# Patient Record
Sex: Male | Born: 1940 | Race: White | Hispanic: No | Marital: Married | Smoking: Former smoker
Health system: Southern US, Community
[De-identification: ages and names within clinical notes are randomized; demographics above are authoritative.]

## PROBLEM LIST (undated history)

## (undated) DIAGNOSIS — F32A Depression, unspecified: Secondary | ICD-10-CM

## (undated) DIAGNOSIS — M1990 Unspecified osteoarthritis, unspecified site: Secondary | ICD-10-CM

## (undated) DIAGNOSIS — K922 Gastrointestinal hemorrhage, unspecified: Secondary | ICD-10-CM

## (undated) DIAGNOSIS — N186 End stage renal disease: Secondary | ICD-10-CM

## (undated) DIAGNOSIS — IMO0001 Reserved for inherently not codable concepts without codable children: Secondary | ICD-10-CM

## (undated) DIAGNOSIS — G8929 Other chronic pain: Secondary | ICD-10-CM

## (undated) DIAGNOSIS — M549 Dorsalgia, unspecified: Secondary | ICD-10-CM

## (undated) DIAGNOSIS — E119 Type 2 diabetes mellitus without complications: Secondary | ICD-10-CM

## (undated) DIAGNOSIS — I509 Heart failure, unspecified: Secondary | ICD-10-CM

## (undated) DIAGNOSIS — Z9289 Personal history of other medical treatment: Secondary | ICD-10-CM

## (undated) DIAGNOSIS — J449 Chronic obstructive pulmonary disease, unspecified: Secondary | ICD-10-CM

## (undated) DIAGNOSIS — I10 Essential (primary) hypertension: Secondary | ICD-10-CM

## (undated) DIAGNOSIS — F419 Anxiety disorder, unspecified: Secondary | ICD-10-CM

## (undated) DIAGNOSIS — M109 Gout, unspecified: Secondary | ICD-10-CM

## (undated) DIAGNOSIS — J189 Pneumonia, unspecified organism: Secondary | ICD-10-CM

## (undated) DIAGNOSIS — K260 Acute duodenal ulcer with hemorrhage: Secondary | ICD-10-CM

## (undated) DIAGNOSIS — F329 Major depressive disorder, single episode, unspecified: Secondary | ICD-10-CM

## (undated) DIAGNOSIS — E669 Obesity, unspecified: Secondary | ICD-10-CM

## (undated) DIAGNOSIS — D649 Anemia, unspecified: Secondary | ICD-10-CM

## (undated) DIAGNOSIS — I4892 Unspecified atrial flutter: Secondary | ICD-10-CM

## (undated) DIAGNOSIS — Z992 Dependence on renal dialysis: Secondary | ICD-10-CM

## (undated) HISTORY — PX: JOINT REPLACEMENT: SHX530

## (undated) HISTORY — PX: TOTAL HIP ARTHROPLASTY: SHX124

## (undated) HISTORY — DX: Gastrointestinal hemorrhage, unspecified: K92.2

## (undated) HISTORY — DX: Acute duodenal ulcer with hemorrhage: K26.0

## (undated) HISTORY — PX: CHOLECYSTECTOMY: SHX55

---

## 2000-03-23 ENCOUNTER — Emergency Department (HOSPITAL_COMMUNITY): Admission: EM | Admit: 2000-03-23 | Discharge: 2000-03-23 | Payer: Self-pay | Admitting: Emergency Medicine

## 2000-03-23 ENCOUNTER — Encounter: Payer: Self-pay | Admitting: Emergency Medicine

## 2006-02-23 ENCOUNTER — Encounter: Admission: RE | Admit: 2006-02-23 | Discharge: 2006-02-23 | Payer: Self-pay | Admitting: Internal Medicine

## 2006-06-15 ENCOUNTER — Inpatient Hospital Stay (HOSPITAL_COMMUNITY): Admission: RE | Admit: 2006-06-15 | Discharge: 2006-06-19 | Payer: Self-pay | Admitting: Orthopedic Surgery

## 2006-07-17 ENCOUNTER — Encounter: Admission: RE | Admit: 2006-07-17 | Discharge: 2006-08-15 | Payer: Self-pay | Admitting: Orthopedic Surgery

## 2006-12-19 HISTORY — PX: ARTERIOVENOUS GRAFT PLACEMENT: SUR1029

## 2007-08-01 ENCOUNTER — Encounter: Admission: RE | Admit: 2007-08-01 | Discharge: 2007-08-01 | Payer: Self-pay | Admitting: Internal Medicine

## 2007-10-09 ENCOUNTER — Ambulatory Visit: Payer: Self-pay | Admitting: Cardiology

## 2007-10-09 ENCOUNTER — Inpatient Hospital Stay (HOSPITAL_COMMUNITY): Admission: EM | Admit: 2007-10-09 | Discharge: 2007-10-26 | Payer: Self-pay | Admitting: Emergency Medicine

## 2007-10-09 ENCOUNTER — Ambulatory Visit: Payer: Self-pay | Admitting: Hematology and Oncology

## 2007-10-15 ENCOUNTER — Ambulatory Visit: Payer: Self-pay | Admitting: Vascular Surgery

## 2007-10-15 ENCOUNTER — Encounter (INDEPENDENT_AMBULATORY_CARE_PROVIDER_SITE_OTHER): Payer: Self-pay | Admitting: Nephrology

## 2007-10-18 ENCOUNTER — Ambulatory Visit: Payer: Self-pay | Admitting: Vascular Surgery

## 2007-12-06 ENCOUNTER — Ambulatory Visit (HOSPITAL_COMMUNITY): Admission: RE | Admit: 2007-12-06 | Discharge: 2007-12-06 | Payer: Self-pay | Admitting: Nephrology

## 2007-12-10 ENCOUNTER — Ambulatory Visit: Payer: Self-pay | Admitting: Vascular Surgery

## 2007-12-10 ENCOUNTER — Ambulatory Visit (HOSPITAL_COMMUNITY): Admission: RE | Admit: 2007-12-10 | Discharge: 2007-12-10 | Payer: Self-pay | Admitting: Nephrology

## 2008-01-12 ENCOUNTER — Ambulatory Visit (HOSPITAL_COMMUNITY): Admission: RE | Admit: 2008-01-12 | Discharge: 2008-01-12 | Payer: Self-pay | Admitting: Vascular Surgery

## 2008-01-12 ENCOUNTER — Ambulatory Visit: Payer: Self-pay | Admitting: Vascular Surgery

## 2008-08-06 ENCOUNTER — Encounter: Admission: RE | Admit: 2008-08-06 | Discharge: 2008-08-06 | Payer: Self-pay | Admitting: Nephrology

## 2008-08-07 ENCOUNTER — Encounter: Admission: RE | Admit: 2008-08-07 | Discharge: 2008-08-07 | Payer: Self-pay | Admitting: Nephrology

## 2008-08-09 ENCOUNTER — Inpatient Hospital Stay (HOSPITAL_COMMUNITY): Admission: EM | Admit: 2008-08-09 | Discharge: 2008-08-12 | Payer: Self-pay | Admitting: Emergency Medicine

## 2008-08-11 ENCOUNTER — Encounter (INDEPENDENT_AMBULATORY_CARE_PROVIDER_SITE_OTHER): Payer: Self-pay | Admitting: General Surgery

## 2008-10-19 IMAGING — US US ABDOMEN COMPLETE
1 series · 14 of 25 positions shown · non-contrast
Comparison: None

CLINICAL DATA: Right upper quadrant pain

ABDOMEN ULTRASOUND
TECHNIQUE: Complete abdominal ultrasound examination was performed
including evaluation of the liver, gallbladder, bile ducts,
pancreas, kidneys, spleen, IVC, and abdominal aorta.

[Series 1: us abdomen complete · 0.30mm/px · 14 of 77 slices shown]
[im 1/77]
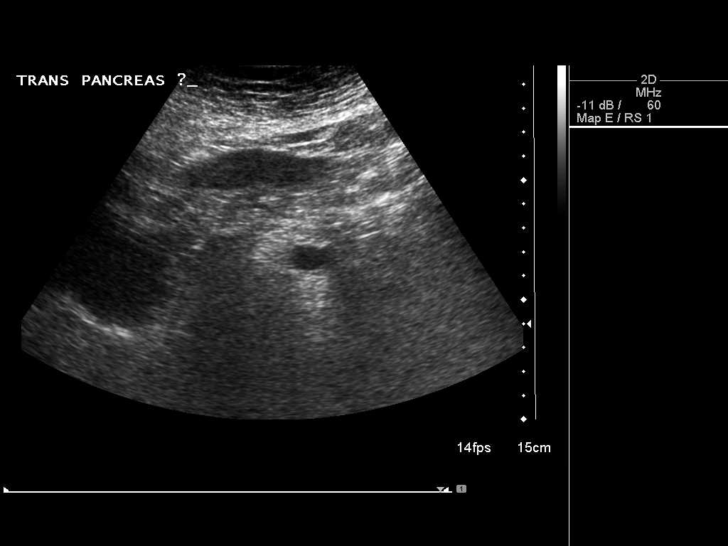
[im 7/77]
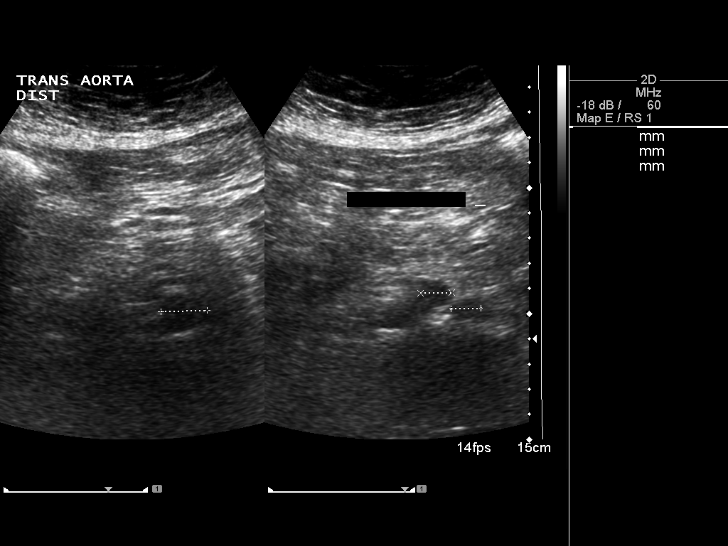
[im 13/77]
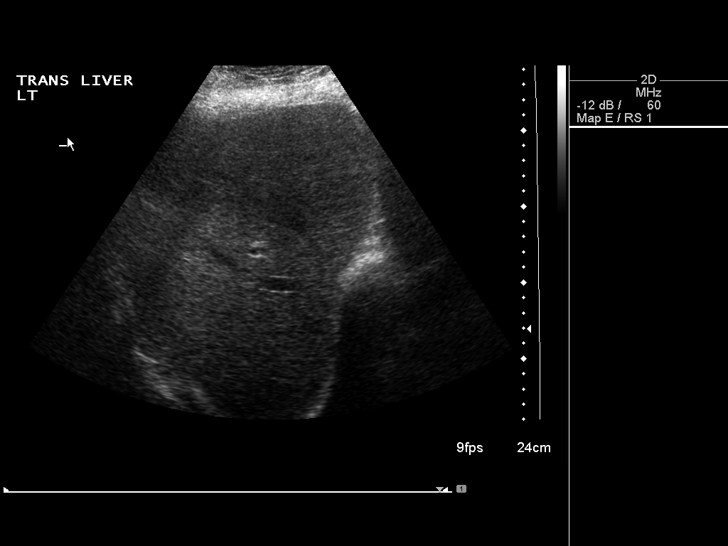
[im 20/77]
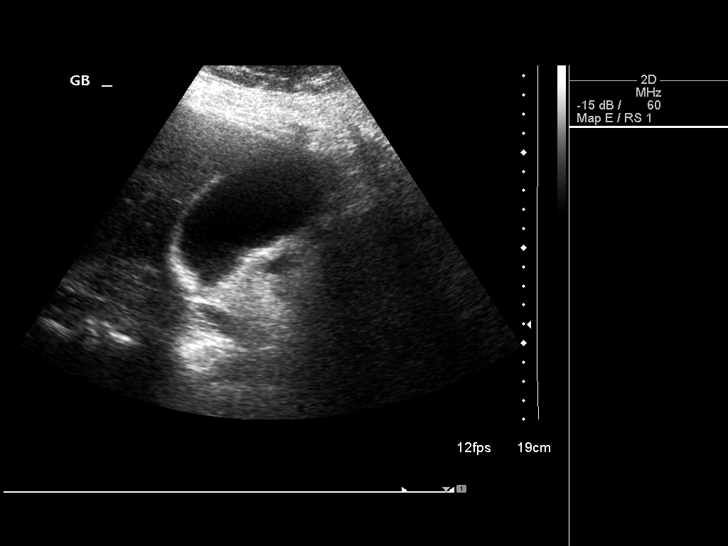
[im 26/77]
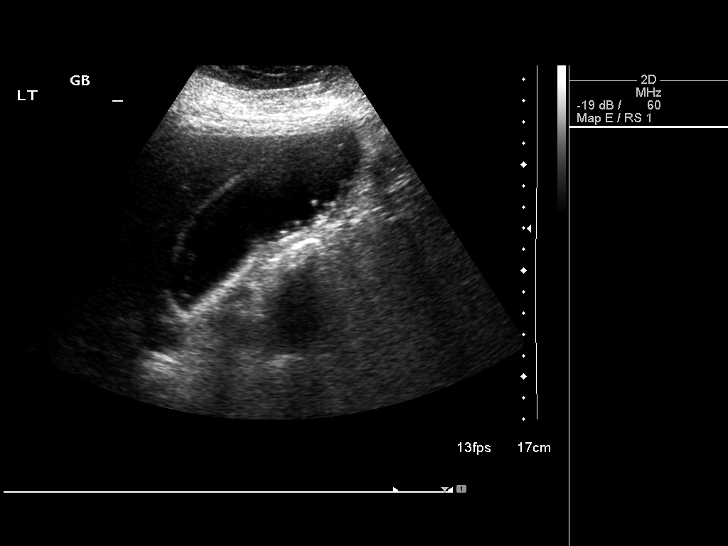
[im 29/77]
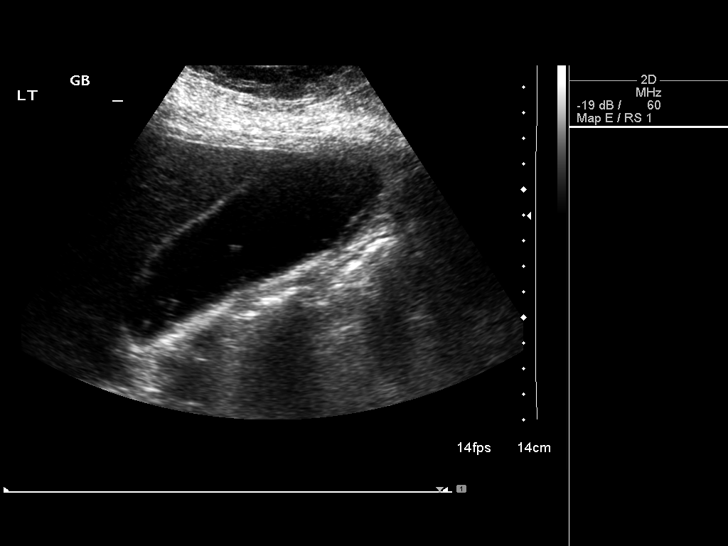
[im 35/77]
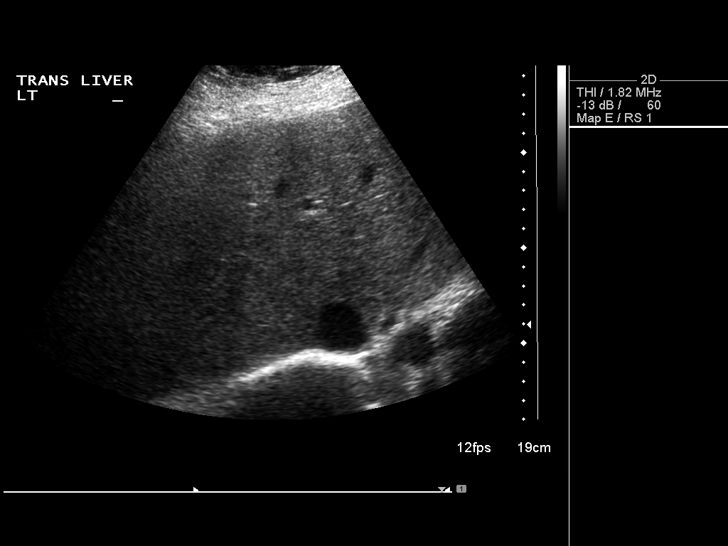
[im 42/77]
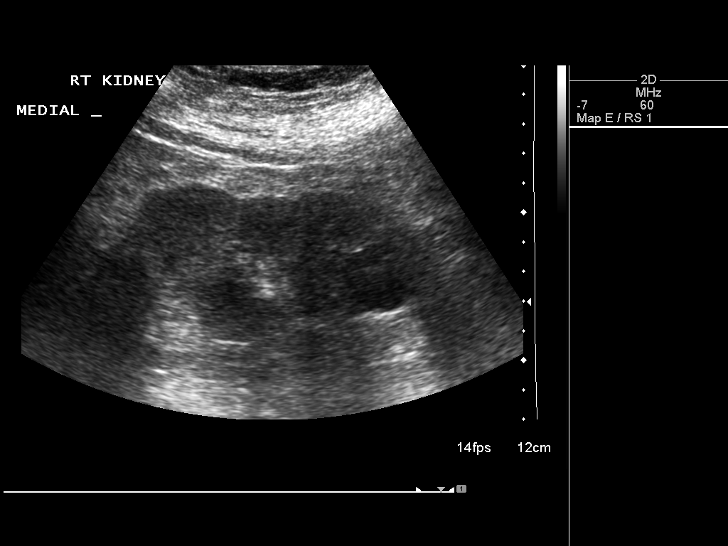
[im 48/77]
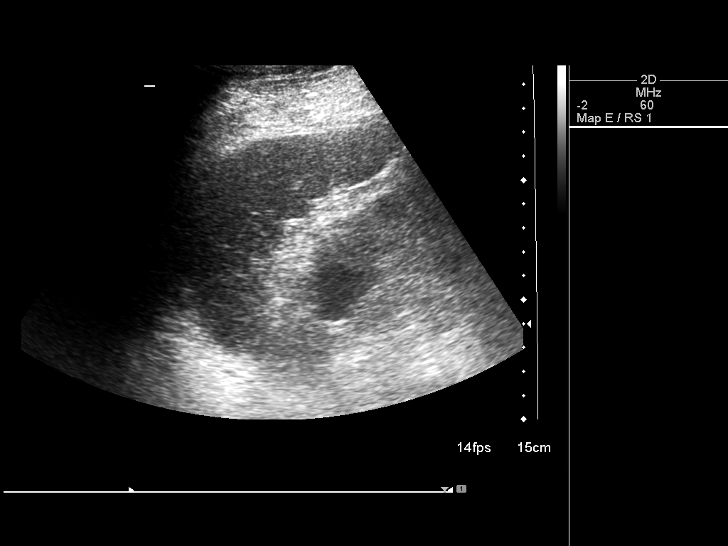
[im 51/77]
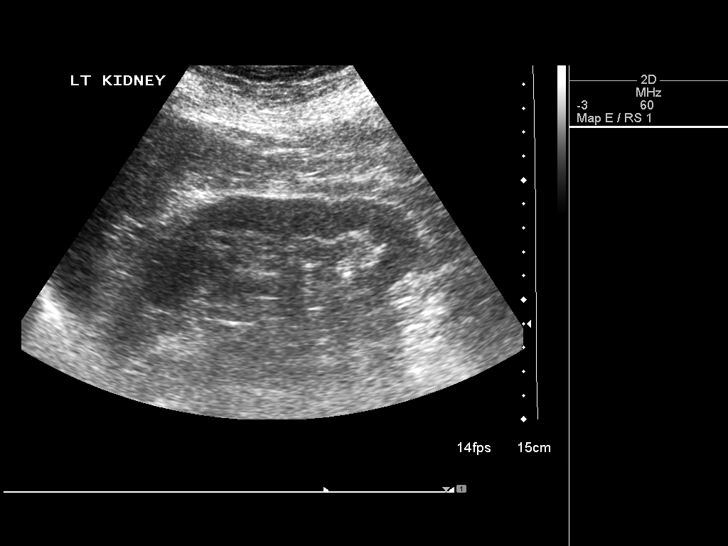
[im 58/77]
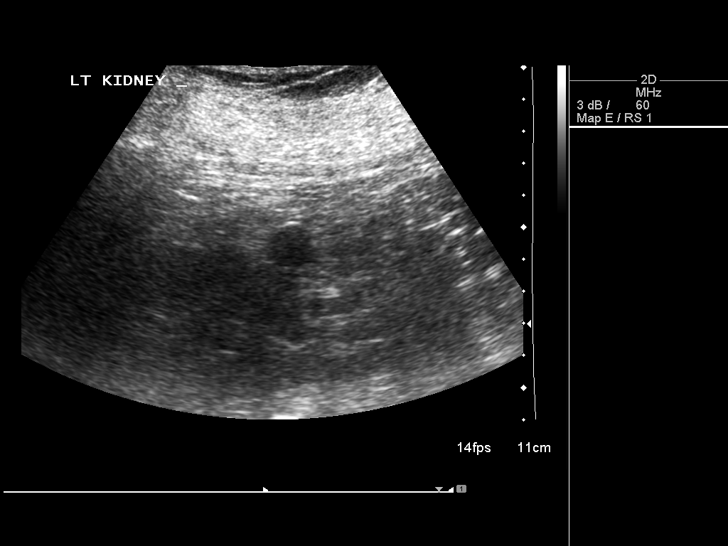
[im 64/77]
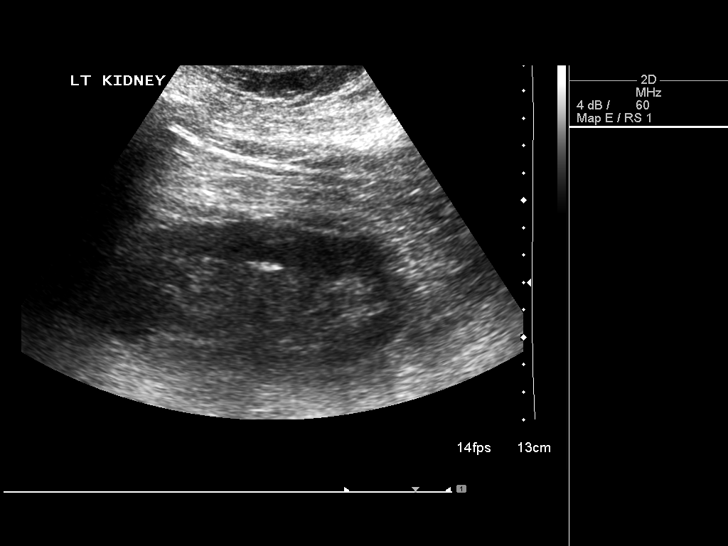
[im 70/77]
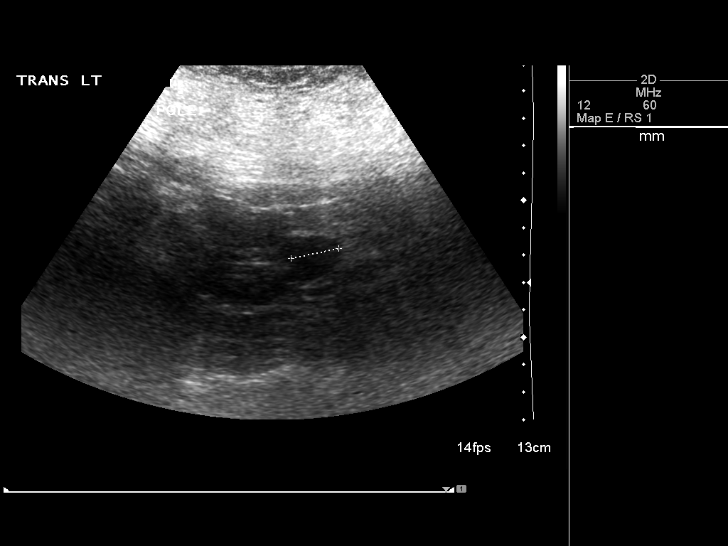
[im 77/77]
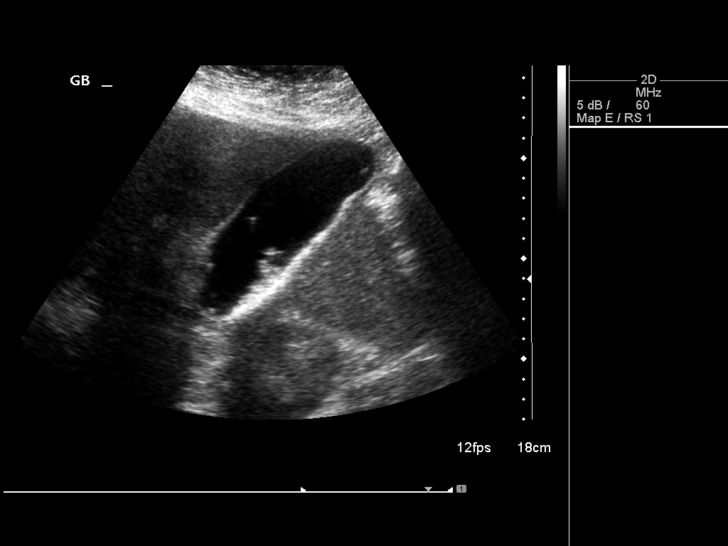

[14 of 25 positions shown; findings below may reference images not displayed]

FINDINGS: Numerous tiny mobile gallstones are seen within the
gallbladder lumen.  No gallbladder wall thickening or
pericholecystic fluid are identified.  The common duct is upper
limits of normal measuring 7 mm in width.  The liver and spleen
have a normal appearance.  The pancreas, aorta,  and IVC are not
well seen because of overlying bowel gas.  And 8 mm calculus is
noted at the upper pole of the right kidney, and there is a 2.2 cm
complex cyst at the lower pole of the right kidney.  There are
three separate complex cysts versus solid masses on the left
kidney.  The largest of these is at the upper pole measuring
cm.
IMPRESSION: Cholelithiasis.

There are complex cysts on both kidneys.  Correlation with CT scan
may be of help for better characterization.

## 2008-10-23 IMAGING — CR DG CHEST 2V
2 series · 2 of 2 positions shown · non-contrast
Comparison: CT angio chest 08/09/2008.  Two-view chest x-ray
10/20/2007.

CLINICAL DATA: Leukocytosis. Preoperative respiratory evaluation
prior to cholecystectomy.

CHEST - 2 VIEW 08/11/2008:

[w chest pa]
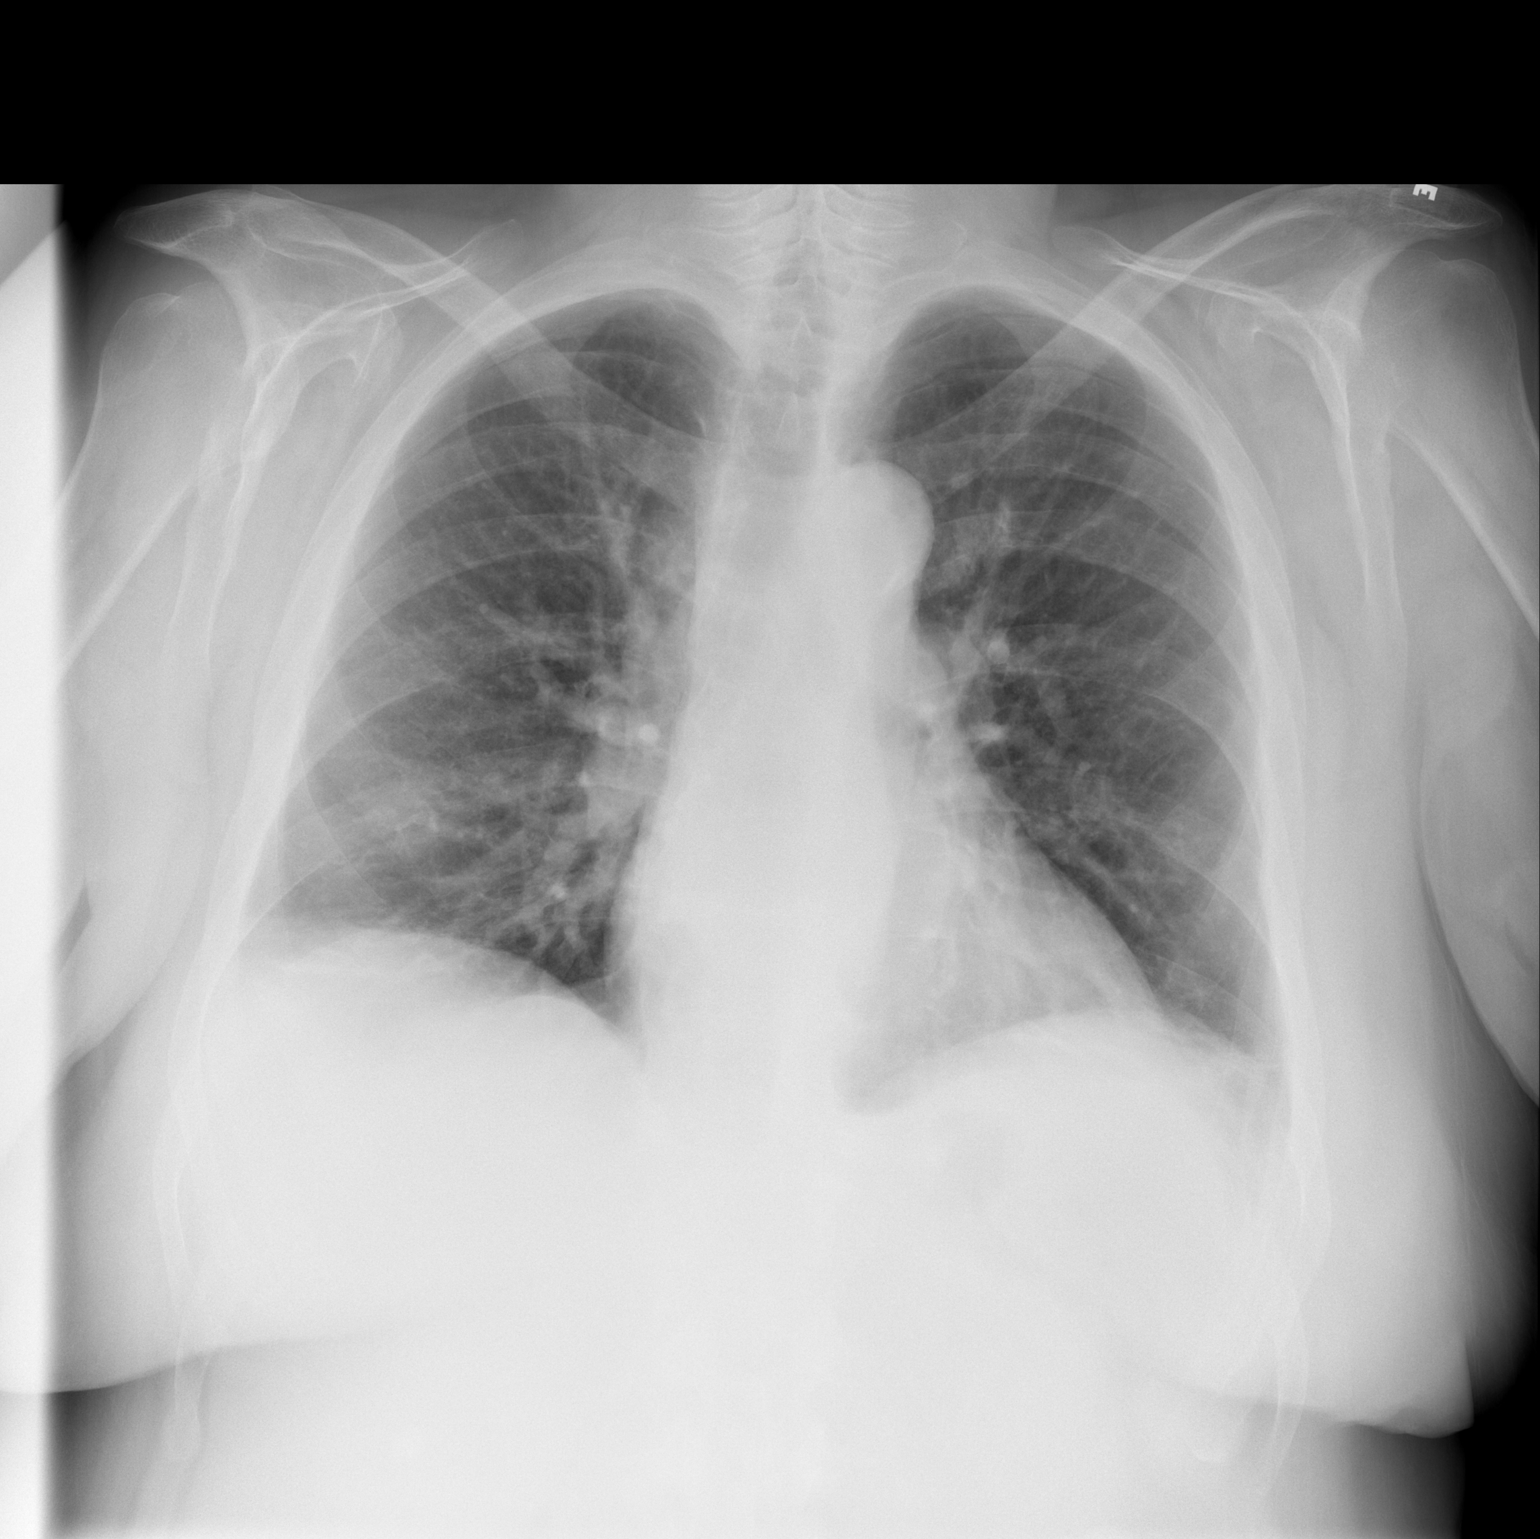

[w chest lat]
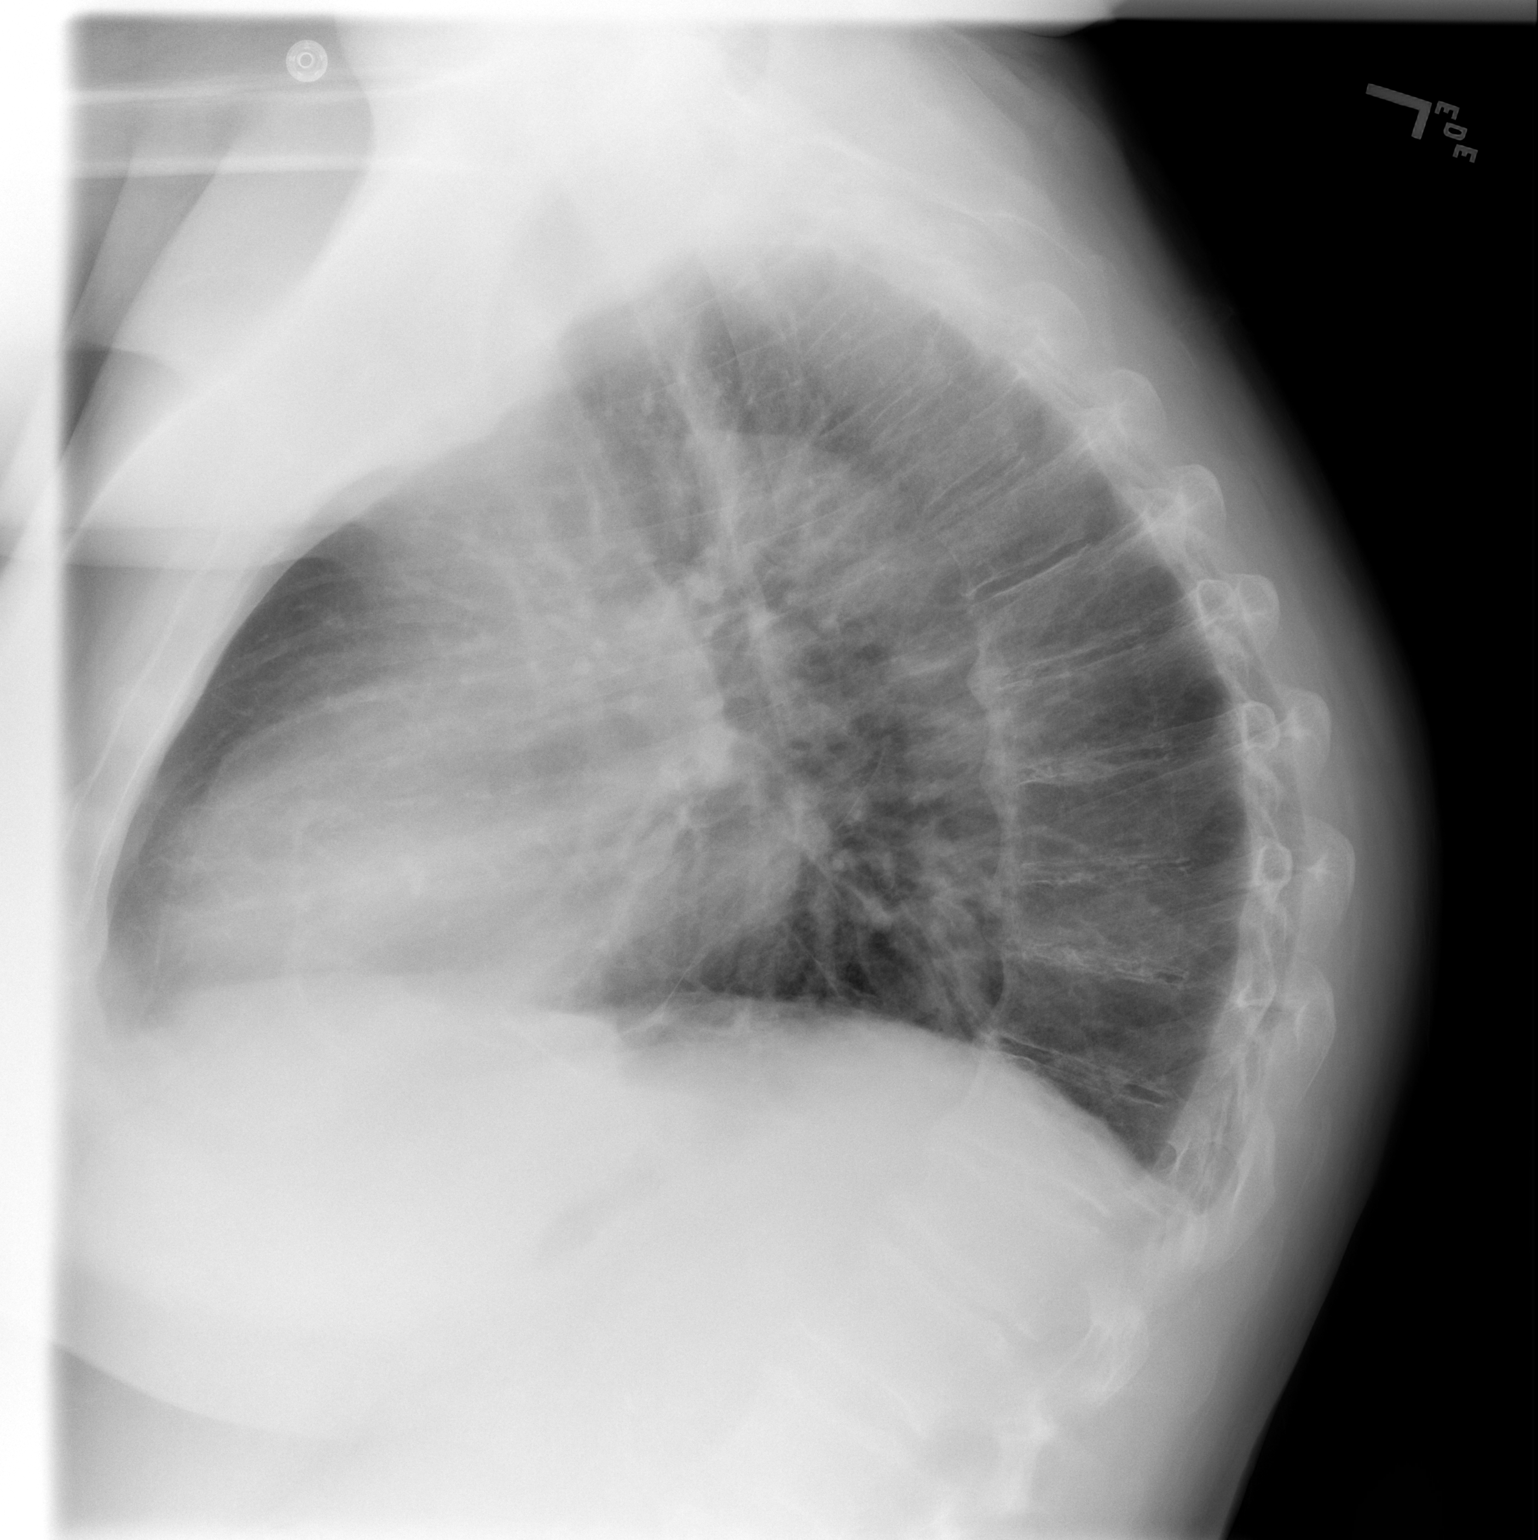

[2 of 2 positions shown; findings below may reference images not displayed]

FINDINGS: Stable mild linear atelectasis in the lower lobes as
noted on the CT.  Lungs otherwise clear.  Heart size normal and
stable.  Hilar and mediastinal contours unremarkable.  No pleural
effusions.  Degenerative changes throughout the thoracic spine with
exaggeration of the usual thoracic kyphosis.
IMPRESSION: Stable mild atelectasis in the lower lobes as noted on the CT 2
days ago.  No new abnormalities.

## 2009-04-07 ENCOUNTER — Ambulatory Visit (HOSPITAL_COMMUNITY): Admission: RE | Admit: 2009-04-07 | Discharge: 2009-04-07 | Payer: Self-pay | Admitting: Nephrology

## 2009-09-17 ENCOUNTER — Ambulatory Visit (HOSPITAL_COMMUNITY): Admission: RE | Admit: 2009-09-17 | Discharge: 2009-09-17 | Payer: Self-pay | Admitting: Nephrology

## 2010-03-11 ENCOUNTER — Ambulatory Visit (HOSPITAL_COMMUNITY): Admission: RE | Admit: 2010-03-11 | Discharge: 2010-03-11 | Payer: Self-pay | Admitting: Nephrology

## 2010-10-12 ENCOUNTER — Ambulatory Visit (HOSPITAL_COMMUNITY)
Admission: RE | Admit: 2010-10-12 | Discharge: 2010-10-12 | Payer: Self-pay | Source: Home / Self Care | Admitting: Nephrology

## 2010-12-22 ENCOUNTER — Ambulatory Visit (HOSPITAL_COMMUNITY)
Admission: RE | Admit: 2010-12-22 | Discharge: 2010-12-22 | Payer: Self-pay | Source: Home / Self Care | Attending: Nephrology | Admitting: Nephrology

## 2010-12-22 LAB — POCT I-STAT, CHEM 8
BUN: 40 mg/dL — ABNORMAL HIGH (ref 6–23)
Calcium, Ion: 1.1 mmol/L — ABNORMAL LOW (ref 1.12–1.32)
Chloride: 105 mEq/L (ref 96–112)
Creatinine, Ser: 8.1 mg/dL — ABNORMAL HIGH (ref 0.4–1.5)
Glucose, Bld: 140 mg/dL — ABNORMAL HIGH (ref 70–99)
HCT: 37 % — ABNORMAL LOW (ref 39.0–52.0)
Hemoglobin: 12.6 g/dL — ABNORMAL LOW (ref 13.0–17.0)
Potassium: 4.9 mEq/L (ref 3.5–5.1)
Sodium: 137 mEq/L (ref 135–145)
TCO2: 26 mmol/L (ref 0–100)

## 2010-12-29 ENCOUNTER — Ambulatory Visit (HOSPITAL_COMMUNITY)
Admission: RE | Admit: 2010-12-29 | Discharge: 2010-12-29 | Payer: Self-pay | Source: Home / Self Care | Attending: Vascular Surgery | Admitting: Vascular Surgery

## 2011-01-03 LAB — GLUCOSE, CAPILLARY
Glucose-Capillary: 111 mg/dL — ABNORMAL HIGH (ref 70–99)
Glucose-Capillary: 120 mg/dL — ABNORMAL HIGH (ref 70–99)

## 2011-01-03 LAB — POCT I-STAT 4, (NA,K, GLUC, HGB,HCT)
Glucose, Bld: 106 mg/dL — ABNORMAL HIGH (ref 70–99)
HCT: 36 % — ABNORMAL LOW (ref 39.0–52.0)
Hemoglobin: 12.2 g/dL — ABNORMAL LOW (ref 13.0–17.0)
Potassium: 4.5 mEq/L (ref 3.5–5.1)
Sodium: 141 mEq/L (ref 135–145)

## 2011-01-03 LAB — PROTIME-INR
INR: 0.88 (ref 0.00–1.49)
Prothrombin Time: 12.1 seconds (ref 11.6–15.2)

## 2011-01-03 LAB — SURGICAL PCR SCREEN
MRSA, PCR: NEGATIVE
Staphylococcus aureus: NEGATIVE

## 2011-01-03 LAB — APTT: aPTT: 35 seconds (ref 24–37)

## 2011-01-09 ENCOUNTER — Encounter: Payer: Self-pay | Admitting: Internal Medicine

## 2011-01-10 ENCOUNTER — Encounter: Payer: Self-pay | Admitting: Nephrology

## 2011-05-03 NOTE — Consult Note (Signed)
NAMECHESTER, SIBERT               ACCOUNT NO.:  [id number]   MEDICAL RECORD NO.:  [id number]          PATIENT TYPE:  OBV   LOCATION:  5152                         FACILITY:  [facility]   PHYSICIAN:  Cherylynn Ridges, M.D.    DATE OF BIRTH:  10/31/1941   DATE OF CONSULTATION:  DATE OF DISCHARGE:                                 CONSULTATION   Thank you very much for asking me to see Mr. Diehl.  He is a 70-year-  old gentleman who recently diagnosed with gallstones with abdominal pain  in the right upper quadrant, right flank, and right back.  He has had a  series of tests including a CT scan and an ultrasound.  His CT scan  identified gallstones, and ultrasound confirmed the presence of stones  and no pericholecystic fluid or any gallbladder wall thickening.  The  common bile duct was dilated on the ultrasound, but no intraductal  stones were noted.   He comes in today with a white count of 21.3 thousand, but not looking  septic.  Last dialysis was on yesterday on Friday and he comes in for  evaluation.   PAST MEDICAL HISTORY:  1. Noninsulin-dependent diabetes.  2. Hypertension.  3. Chronic renal insufficiency.   PAST SURGICAL HISTORY:  He has had a total hip replacement in July 2007  and also has had a left arm fistula placed by Dr. Hart Rochester, revised by Dr.  Cira Rue from which he gets his dialysis.   Medications include allopurinol, diltiazem, glyburide, iron, and vitamin  C.   ALLERGIES:  He is allergic to METFORMIN, PENICILLIN, and SULFA  MEDICATIONS.   For review of systems, he has had no fevers and chills.  No jaundice.  Bowel movements have been dark and not light.  He makes no urine.   On exam, he is afebrile and 98.4, pulse of 96, blood pressure 112/60,  and respiratory rate is 24.  He is normocephalic and atraumatic and  anicteric.  Neck is supple.  Lungs are clear.  Cardiac exam regular  rhythm and rate.  His abdomen, he is morbidly obese with no previous  scars.  He does  have some striae on his abdominal wall longitudinally  with no scars.  He is mildly tender in the lateral aspect of his  abdominal wall in his right costal margin with no palpable masses and no  palpable gallbladder.  Liver and spleen could not be palpated.   I have reviewed his laboratory studies and his LFTs that I can see were  normal.  His white count was 21.3 thousand.  His hemoglobin was low,  however, at 9.6 with hematocrit of 28.   IMPRESSION:  The patient has gallstones on the previous ultrasound, but  he is also anemic, has diabetes, history of hypertension, and chronic  renal insufficiency.  Because of his multiple medical problems, I think  it is more appropriate for the patient to be admitted to a Medical  Service and for [name] to consult for possible cholecystectomy in the near  future after the patient has been on IV antibiotics for at  least 24-48  hours.  I have gone ahead and written a  note.  We will leave this to the other services to admit, and we will  try to get him done as soon as possible.  There is possibility that  there is another source for his leukocytosis; however, it seems most  likely the gallstones although the ultrasound does not demonstrated  thickened wall.      Cherylynn Ridges, M.D.  Electronically Signed     JOW/MEDQ  D:  08/09/2008  T:  08/10/2008  Job:  [id number]

## 2011-05-03 NOTE — Op Note (Signed)
NAMEJANN, RA               ACCOUNT NO.:  [id number]   MEDICAL RECORD NO.:  [id number]          PATIENT TYPE:  AMB   LOCATION:  SDS                          FACILITY:  [facility]   PHYSICIAN:  Larina Earthly, M.D.    DATE OF BIRTH:  1941/05/26   DATE OF PROCEDURE:  01/12/2008  DATE OF DISCHARGE:  01/12/2008                               OPERATIVE REPORT   PREOPERATIVE DIAGNOSIS:  End stage renal disease with occluded left  forearm loop arteriovenous Gore-Tex graft.   POSTOPERATIVE DIAGNOSIS:  End stage renal disease with occluded left  forearm loop arteriovenous Gore-Tex graft.   PROCEDURE:  Thrombectomy, interposition jump graft revision to higher  basilic vein of left forearm loop arteriovenous Gore-Tex graft.   SURGEON:  Larina Earthly, M.D.   ASSISTANT:  Nurse   ANESTHESIA:  Monitored anesthesia care.   COMPLICATIONS:  None.   DISPOSITION:  To the recovery room stable.   PROCEDURE IN DETAIL:  The patient was taken to the operating room and  placed in the supine position where the area of the left arm was prepped  and draped in the usual sterile fashion.  An incision was made over the  basilic vein in the above elbow position.  This was imaged with  ultrasound and was of good caliber.  Next, a separate incision was made  over the prior antecubital scar and carried down to isolate the graft  near the old venous revision.  The graft was opened transversely, was  thrombectomized, the arterialized plug was removed, and excellent in  flow was encountered.  This was flushed with heparinized saline and  reoccluded.  A 7 mm Gore-Tex graft was brought onto the field, was  spatulated, and was sewn end-to-side of the basilic vein in the above  elbow position on the medial aspect of the arm.  The graft was flushed  with heparinized saline and reoccluded.  This was then tunneled to the  level of the prior venous end limb of the graft.  The old venous limb of  the graft was ligated  proximally and was divided.  The new interposition  graft was sewn end-to-end to the old graft with a running 6-0 Prolene  suture.  The clamps were removed and an excellent thrill was noted.  The  wound was irrigated with saline.  Hemostasis was obtained with  electrocautery.  The wounds were closed with 3-0 Vicryl in the  subcutaneous and subcuticular tissue.  Benzoin and Steri-Strips were  applied.     Larina Earthly, M.D.  Electronically Signed    TFE/MEDQ  D:  01/12/2008  T:  01/12/2008  Job:  [id number]

## 2011-05-03 NOTE — Discharge Summary (Signed)
[name], [name]               ACCOUNT NO.:  [id number]   MEDICAL RECORD NO.:  [id number]          PATIENT TYPE:  INP   LOCATION:  4741                         FACILITY:  [facility]   PHYSICIAN:  Wilson Singer, M.D.DATE OF BIRTH:  [birth year]   DATE OF ADMISSION:  10/09/2007  DATE OF DISCHARGE:                               DISCHARGE SUMMARY   FINAL DISCHARGE DIAGNOSES:  1. Congestive heart failure, systolic and diastolic.  2. Atrial flutter, now in sinus rhythm.  3. End-stage renal disease.  4. Anemia of chronic disease.  5. Diabetes mellitus, stable.   CURRENT MEDICATIONS:  1. Ferrous sulfate 325 mg daily.  2. Aspirin 81 mg daily.  3. Protonix 40 mg daily.  4. Sliding scale insulin.  5. Aranesp 100 mcg on Friday's.  6. Coreg 12.5 mg b.i.d.  7. Bumex 5 mg b.i.d.  8. Amiodarone 400 mg b.i.d.  9. Ancef 1 gram q.24 hours IV.   HISTORY:  This is a very pleasant 70 year old man was admitted with a 2  week history of shortness of breath and dyspnea on exertion.  He was  found to be in pulmonary edema.  Please see initial history and physical  examination done by Dr. Marthann Schiller.   HOSPITAL PROGRESS:  The patient was admitted and promptly diuresed as  appropriate.  He had an echocardiogram done this year, which showed an  ejection fraction of 40%.  Also his creatinine was elevated on admission  and 6 months previously he was noted to be at about 2 and on admission  was around about 4.  Therefore, nephrology was involved with his care  and they eventually felt that he was end-stage renal disease based on  the diuresis that was occurring by the cardiologist help also.  Unfortunately he went into a tachycardia, which was atrial flutter and  Dr. [place], cardiologist, was involved in his care and instituted  amiodarone.  This converted him into sinus rhythm.  Currently he is  stable and has lost significant fluid and his creatinine has bumped up  to the 5 range.  He has had  vein mapping for dialysis and today is due  to have a dialysis catheter inserted by interventional radiology.  He  feels well overall.   PHYSICAL EXAMINATION:  VITAL SIGNS:  Temperature 95.6, which is low for  him, but he does not look septic.  Blood pressure 122/71, pulse 62 in  sinus rhythm, saturation 93% on room air.  HEART:  Sounds are present and normal in sinus rhythm.  LUNGS:  Clinically clear.   INVESTIGATIONS:  Show BNP of 474, sodium 133, potassium 3.8, bicarbonate  28, BUN 134, creatinine 5.52, INR 1.7, hemoglobin 9.4.  White blood cell  count 19.1, platelets 193.   FURTHER DISPOSITION:  I would think that once he has had his dialysis  catheter inserted and he undergoes dialysis, it should not be too long  before he will be able to be discharged home.  I would keep an eye on  the white blood cell count to make sure there is no evidence of  infection, active  infection, although clinically it does not look like  so.      Wilson Singer, M.D.  Electronically Signed     NCG/MEDQ  D:  10/16/2007  T:  10/16/2007  Job:  [id number]

## 2011-05-03 NOTE — Discharge Summary (Signed)
[name], ENGLERT               ACCOUNT NO.:  [id number]   MEDICAL RECORD NO.:  [id number]          PATIENT TYPE:  INP   LOCATION:  5152                         FACILITY:  [facility]   PHYSICIAN:  Altha Harm, MDDATE OF BIRTH:  [birth year]   DATE OF ADMISSION:  08/09/2008  DATE OF DISCHARGE:  08/12/2008                               DISCHARGE SUMMARY   DISCHARGE DISPOSITION:  Home.   FINAL DISCHARGE DIAGNOSES:  1. Acute cholecystitis status post laparoscopic cholecystectomy.  2. Leukocytosis, resolving.  3. Urinary tract infection.  4. End-stage renal disease.  5. History of gout in the past.  6. Anemia of chronic disease status post transfusion 1 unit packed red      blood cells.  7. Atrial flutter.  8. Congestive heart failure with systolic dysfunction.  9. History of kidney stones.  10.History of osteoarthritis.   DISCHARGE MEDICATIONS:  Include the following:  1. Coreg 3.125 mg p.o. daily.  2. Lantus 20 units subcu nightly.  3. Nephro-Vite 1 tablet p.o. daily.  4. Iron 50 mg p.o. daily.  5. NovoLog 2 unites daily for blood sugars greater than 150.  6. Ciprofloxacin 500 mg p.o. every 24 hours x4 days.  7. Percocet 04/8324 mg 1-2 tablets p.o. every 4 hours p.r.n. pain.   Medications on hold for now: Aspirin 81 mg p.o. daily until seen by Dr.  Dwain Sarna in the office.   CONSULTANTS:  1. Juanetta Gosling, MD from [facility].  2. Nephrology.   PROCEDURES:  1. Laparoscopic cholecystectomy done August 24.  2. Hemodialysis.   DIAGNOSTIC STUDIES:  1. Acute abdominal series done on admission on August 22, which shows      1 cm right renal calculus, no bowel obstruction.  2. CT angiogram done on admission, which shows negative for pulmonary      embolus.  3. Chest x-ray two views on 24th, which shows stable, mild atelectasis      in the left lower lobe.  No new abnormalities.  4. Please note that the patient had ultrasound done on August 20 as an   outpatient, which shows cholelithiasis and a CT of the pelvis with      contrast one on the 19th as an outpatient.   ALLERGIES:  PENICILLIN, SULFA, METFORMIN.   CODE STATUS:  Full code.   CHIEF COMPLAINT:  Pain in his back.   HISTORY OF PRESENT ILLNESS:  Please refer to the H&P dictated by Dr.  Ashley Royalty for details of the HPI.   HOSPITAL COURSE:  1. The patient was found to have leukocytosis and pain in the back.      Based upon the radiologic finding, it was felt that the      leukocytosis was in part due to the cholecystitis.  The patient was      admitted, placed on IV ciprofloxacin for 24-48 hours and then taken      to surgery for removal of his gallbladder.  The patient has had a      successful laparoscopic cholecystectomy without any apparent      complications.  The  patient was able to tolerate his food and is      now up to allow fat renal diet without any difficulty.  The patient      has a JP drain in place, and is to follow up with [facility] on September 1 at 2:15 p.m. in the office.  The patient has      been given instructions for threshold to call the surgical service      including fever, abdominal pain, redness, postop drainage, nausea      and vomiting.  2. Urinary tract infection.  The patient was found to have a UTI on      admission.  The patient was started on ciprofloxacin.  At the time      of discharge urine culture still was not back and the patient is      continued on his ciprofloxacin to complete an additional 3 days.  3. End-stage renal disease.  The patient underwent his dialysis one      day early on Sunday as he was scheduled for surgery on Monday.  He      currently appears to be in no respiratory compromise as.  As he is      doing well, the patient is to resume his dialysis on his Monday,      Wednesday, Friday schedule,  next dialysis being tomorrow, on      Wednesday.  4. Anemia of chronic disease.  The patient does have  anemia of chronic      disease.  His hemoglobin upon arrival to the hospital was 9.6.  His      usual hemoglobin is 10.  The patient dropped down to 8.3 and was      transfused 1 unit of packed red blood cells prior to discharge on      today, the 25th.  H&H has been drawn and is pending to be followed      by his nephrologist as an outpatient.  Otherwise the patient has      remained stable.  His blood sugars were well controlled on his      usual regimen.  The patient is afebrile today.  Vital signs are      stable.  The patient is tolerating diet, ambulating without      difficulty and generally well appearing.   FOLLOWUP:  The patient is to follow up with [facility] on  September 1.  He has an appointment already scheduled at 2:15 pm.  He is  to follow up with Dr. Kathrene Bongo, who is his primary care physician  during his dialysis.  The patient is to resume his dialysis tomorrow as  usual.   DIETARY RESTRICTIONS:  The patient should be on a renal diet.      Altha Harm, MD  Electronically Signed     MAM/MEDQ  D:  08/12/2008  T:  08/12/2008  Job:  [id number]

## 2011-05-03 NOTE — Consult Note (Signed)
[name] [name]               ACCOUNT NO.:  [id number]   MEDICAL RECORD NO.:  [id number]          PATIENT TYPE:  INP   LOCATION:  4741                         FACILITY:  [facility]   PHYSICIAN:  Lauretta I. Odogwu, M.D.DATE OF BIRTH:  [birth year]   DATE OF CONSULTATION:  10/25/2007  DATE OF DISCHARGE:                                 CONSULTATION   REFERRING PHYSICIAN:  [facility].   HISTORY OF PRESENT ILLNESS:  [name] [name] is a pleasant 70 year old male  admitted on October 09, 2007, for insertion of a hemodialysis catheter.  He also has CHF complicated with acute on-chronic renal insufficiency.  On admission, his white count was elevated at 20.3 K.  He was foung to  have Klebsiella UTI and was treated with vancomycin and fortaz.  His white cell count is as follows:- October 25, 2007, 19.2; October 21, 2007, 29.6.  In July of 2008 was 13,000, and June 2008 10.3.  January 2007, the counts were essentially normal.   He currently denies any fevers, chills, or night sweats.  He denies  weight loss or adenopathy.   PAST MEDICAL HISTORY:  1. End-stage renal disease on new hemodialysis on Tuesdays, Thursdays      and Saturdays.  2. Klebsiella UTI.  3. CHF.  4. Anemia of chronic disease.  5. Secondary to hyperparathyroidism.  6. Hypertension.  7. History of atrial flutter during this admission, now resolved.  8. Venous lower extremity ulcer.  9. Obesity.  10.Remote tobacco use.  11.COPD.  12.Gout.   PAST SURGICAL HISTORY:  Status post total hip replacement in June in  2007 on the right, status post left AVG placement on October 19 2007.   ALLERGIES:  PENICILLIN, SULFA, GLUCOPHAGE.   CURRENT MEDICATIONS:  1. Aspirin.  2. Rocephin.  3. Aranesp.  4. Heparin per pharmacy.  5. NovoLog.  6. Lantus.  7. Zanamivir.  8. Protonix.  9. Zemplar.  10.Reglan.  11.Lopressor.  12.Zofran.  13.Roxicodone.  14.Percocet.   REVIEW OF SYSTEMS:  See HPI for significant positives.  The  rest of the  review of systems is negative.   FAMILY HISTORY:  Mother died with MI.  Father died as well with cardiac  problems.  He has 1 sister and 1 brother also who are deceased, all with  heart disease and diabetes.  Negative for oncologic or hematologic  malignancies.   SOCIAL HISTORY:  The patient is married.  he has no children.  He  retired from [employer] in 2006.  He denies alcohol use. He quit  30 pack year of tobacco use about 20 years ago.  He lives in [place].   PHYSICAL EXAMINATION:  GENERAL:  This is an obese 70 year old white male  in no acute distress, awake, alert and oriented times 3.  VITAL SIGNS:  Blood pressure 159/85, pulse 94, respirations 20,  temperature 97, pulse oximetry 92% in room air.  HEENT:  Normocephalic, atraumatic.  PERRLA.  Oral mucosa without thrush  or lesions.  NECK:  Supple.  No cervical or supraclavicular masses.  LUNGS:  Essentially clear to auscultation.  No axillary masses.  CARDIOVASCULAR:  Regular rate and rhythm without murmurs, rubs or  gallops.  ABDOMEN:  Obese, nontender.  Bowel sounds times 4.  No palpable spleen  or liver.  GENITOURINARY:  Deferred.  RECTAL:  Deferred.  EXTREMITIES:  With no clubbing or cyanosis.  No edema.  SKIN:  Remarkable for blister in the left lower extremity which is  healing.  Chronic lichen planus.  His skin is very dry. He also has mild  bruising in the upper extremities.  No petechial rash.  NEUROLOGIC:  Nonfocal.   LABORATORY DATA:  Hemoglobin 10.6, hematocrit 32.9, white count 19.2,  platelets 389, neutrophils 14.8, monocytes, 1.6, eosinophils 0.9, MCV  88.4, PT 19.9, INR 1.6, reticulocyte count 165.2.  TSH 1.303, iron 201,  TIBC 372, saturation 54%, ferritin 230, B12 1155, folic acid greater  than 20, sodium 133, potassium 3.7, BUN 148, creatinine 6.15, glucose  181, total bilirubin 0.6, alkaline phosphatase 112, AST 26, ALT 21,  total protein 7.9, albumin 3.1, calcium 9.8.    Urinalysis remarkable for large bilirubin, protein 30, negative  nitrites. Moderate leukocytes.   Peripheral smear which demonstrates abundant neutrophils in various  stages of maturation.  Thee were no blasts.   ASSESSMENT/PLAN:  Dr. Dalene Carrow has seen and evaluated the patient and the  chart has been reviewed.  This is a pleasant 70 year old white male, has  neutrophilic leukocytosis which appears to be reactive, secondary to  underlying infection.  He has a history of Klebsiella urinary trat  infection and continues on a course of antibiotics.  The pateint should have his blood count monitored by his primary care  physician.  If needed, he can follow up with hematology.  No additional  workup is required for now.   Thank you very much for allowing [name] the opportunity to participate in  the care of this patient.      [name] [name], P.A.      Lauretta I. Odogwu, M.D.  Electronically Signed    SW/MEDQ  D:  10/26/2007  T:  10/26/2007  Job:  [id number]   cc:   Sanjuana Mae, MD  Massie Maroon, MD

## 2011-05-03 NOTE — Op Note (Signed)
NAMEJOSEH, SJOGREN               ACCOUNT NO.:  [id number]   MEDICAL RECORD NO.:  [id number]          PATIENT TYPE:  INP   LOCATION:  5152                         FACILITY:  [facility]   PHYSICIAN:  Juanetta Gosling, MDDATE OF BIRTH:  1940-12-26   DATE OF PROCEDURE:  DATE OF DISCHARGE:                               OPERATIVE REPORT   He is a consult from Dr. Ashley Royalty, of the [facility]. team.   PREOPERATIVE DIAGNOSIS:  Acute cholecystitis.   POSTOPERATIVE DIAGNOSIS:  Acute cholecystitis.   PROCEDURE PERFORMED:  Laparoscopic cholecystectomy.   SURGEON:  Juanetta Gosling, MD.   FIRST ASSISTANT:  Cherylynn Ridges, MD and Letha Cape, PA   ESTIMATED BLOOD LOSS:  50 mL.   ANESTHESIA:  GETA.   FINDINGS:  Acute cholecystitis with a very friable gallbladder.   SPECIMENS:  Gallbladder and contents.   DRAINS:  Jackson-Pratt 10-mm to the right upper quadrant.   DISPOSITION:  To PACU in a stable condition.   INDICATION:  This is a 70 year old male with a several-day history of  right upper quadrant pain and also associated with some back pain that  may or may not be related to his gallbladder.  He underwent an  ultrasound that showed cholelithiasis and some wall thickening.  I plan  for a laparoscopic cholecystectomy.  After several days of antibiotics,  he receives hemodialysis which he got yesterday and his potassium is  normal on the day of surgery.  His white blood cell count was 21  initially on arrival.  It was decreased to 14 and on antibiotics and we  will plan for a LAP CHOLE today.   PROCEDURE:  After informed consent was obtained, the patient was taken  to the operating room and was placed under general endotracheal  anesthesia without complication.  Sequential compression devices were  placed on his legs throughout the operation.  His abdomen was then  prepped and draped in standard sterile surgical fashion.  A 10-mm  vertical incision was then made below his  belly button.  Fascia was  identified and entered sharply.  The peritoneum was then entered  bluntly.  An 0 Vicryl pursestring was placed on the fascial edges and a  Hasson trocar was inserted.  The abdomen was then insufflated to 15 mmHg  pressure without complication.  A further 5-mm epigastric port and two 5-  mm right upper quadrant ports were then placed under direct vision after  infiltration with local anesthetic without complication.  He was noted  to have a large amount of intra-abdominal fat, a fatty liver, and a very  friable gallbladder that had the appearance of acute cholecystitis.  The  gallbladder was then retracted cephalad and lateral.  It was very  difficult initially to get the gallbladder to be retracted in position.  I then began dissecting in the triangle of Calot and the gallbladder  began popping out with some better visualization.  I was able to  identify the cystic duct and the cystic artery with the liver behind  that obtained in the critical view of safety.  It  was attempted to do a  cholangiogram.  The cystic duct was very diminutive.  I placed a clip  proximally.  When I attempted  to cut this, it nearly cut across the  entire cystic duct.  I then did not think at that point to place a  cholangiocatheter would be safe and was confident that this was the  cystic duct given my further view.  I then placed 2 clips proximally on  the cystic duct stump.  I then identified the artery, dissected this  free of surrounding structures, placed 2 clips proximally and 1 clip  distally, and then cut the artery.  The gallbladder was then removed  from the liver bed with difficulty due to his inflammation.  There was  leakage of a large amount of bile during this portion of the procedure  as the gallbladder was fused to the liver.  The gallbladder was then  removed from the liver bed.  The clips were observed and those were in  good position.  Hemostasis was observed.  There  was some mild oozing  from his liver bed.  It was controlled.  Irrigation was performed.  The  gallbladder was placed in an Endocatch bag and set on top of the liver.  Irrigation 2 L was used with return of clear fluid.  I then placed a  piece of Surgicel in his gallbladder fossa.  The gallbladder was then  removed with the Endocatch through the umbilical port site without  difficulty.  A 10-mm Jackson-Pratt drain was then placed in the right  upper quadrant and brought out of one of the right upper quadrant  incisions.  Ports were then removed without complication.  The prior  pursestring was tied down.  There was still another hole in the fascia.  There another pursestring was then placed with good closure of the  fascia of the umbilical port site.  All wounds were then closed with a 4-  0 Monocryl and Dermabond was placed on the wounds.  He tolerated the  procedure well and was transferred to recovery room in stable condition.      Juanetta Gosling, MD  Electronically Signed     MCW/MEDQ  D:  08/11/2008  T:  08/12/2008  Job:  [id number]

## 2011-05-03 NOTE — H&P (Signed)
[name]               ACCOUNT NO.:  [id number]   MEDICAL RECORD NO.:  [id number]          PATIENT TYPE:  INP   LOCATION:  5152                         FACILITY:  [facility]   PHYSICIAN:  Altha Harm, MDDATE OF BIRTH:  03-24-1941   DATE OF ADMISSION:  08/09/2008  DATE OF DISCHARGE:                              HISTORY & PHYSICAL   CHIEF COMPLAINT:  Pain in his back.   HISTORY OF PRESENT ILLNESS:  This is a 70 year old gentleman with end-  stage renal disease who presents to the emergency room with complaints  of pain in the right upper back.   The patient states that the pain started initially in his right upper  quadrant approximately 4 days ago while at dialysis.  He states he had  one episode and associated with it at this time was episode of  hypotension.  The patient states that he was given some form of IV  antibiotics by his Nephrologist Dr. Kathrene Bongo.  Subsequently he was  sent to have CT scan of the abdomen done as an outpatient.  This  revealed that he had findings concerning for acute cholecystitis.  The  patient states that after that he had no further abdominal pain.  However, he has had pain persistent in the right back in the upper  quadrant.  The patient describes the pain as sharp pain which is  intermittent in nature.  He states that the pain is at this point a 10  of 10, nonradiating.  The pain is not associated with any abdominal  pain, it is not associated with nausea, vomiting or diarrhea.  Please  note that the patient had an ultrasound of the abdomen done on the 20th  which shows cholelithiasis.  They also showed complex cysts on both  kidneys.   The patient was seen in consultation by Dr. Lindie Spruce, General Surgery, who  felt that the patient did not require admission by the surgical services  and we were asked to admit the patient.   PAST MEDICAL HISTORY:  1. Diabetes type 2.  2. Hypertension.  3. Osteoarthritis.  4. History of gout.  5. Atrial flutter.  6. History of congestive heart failure with systolic dysfunction.  7. End-stage renal disease.  8. Chronic anemia.  9. Kidney stones.   SOCIAL HISTORY:  The patient resides with his wife.  There is no  tobacco, alcohol or drug use.   MEDICATIONS:  1. Aspirin 81 mg p.o. daily.  2. Coreg 3.125 mg p.o. daily.  3. Allopurinol 50 mg p.o. daily.  4. Lantus 20 units subcutaneous nightly.  5. Nephro-Vite 1 tablet p.o. daily.  6. Iron 50 mg p.o. daily.  7. NovoLog 2 units daily for blood sugars greater than 150.   ALLERGIES:  1. METFORMIN.  2. PENICILLIN.  3. SULFA.   PRIMARY CARE PHYSICIAN:  Dr. Selena Batten.   NEPHROLOGIST:  Dr. Kathrene Bongo.   REVIEW OF SYSTEMS:  Fourteen systems were reviewed, all systems are  negative except as noted in the HPI.   STUDIES IN THE EMERGENCY ROOM:  Show the following:  White blood cell  count 21.3, hemoglobin 9.6, hematocrit 28, platelet count 249, sodium  136, potassium 3.5, chloride 96, bicarb 27, BUN 25, creatinine 4.9, alk  phos is elevated at 144.  Urinalysis shows 3-6 WBCs.  A KUB done in the  emergency room shows a right kidney stone.   D-dimer is mildly elevated at 3.46.   PHYSICAL EXAMINATION:  Temperature 99.2, blood pressure 107/53, heart  rate 80, respiratory rate 18, O2 sats are 99% on room air.  GENERALLY:  The patient is well appearing.  He is in no acute distress.  HEENT:  He is normocephalic/atraumatic, Pupils are equally round and  reactive to light and accommodation, extraocular movements are intact,  oropharynx is moist.  No exudate, erythema or lesions are noted.  NECK:  Trachea is midline, no masses, no thyromegaly, no JVD, no carotid  bruit.  RESPIRATORY:  Patient has a kyphotic posture, there is no wheezing or  rhonchi and he is clear to auscultation.  CARDIOVASCULAR:  He has got a normal S1 and S2.  No murmurs, rubs or  gallops noted.  PMI is nondisplaced, no heaves or thrills on palpation.  ABDOMINAL:   Abdomen is obese, soft, nontender, nondistended.  No masses,  no hepatosplenomegaly.  The patient has no right upper quadrant  tenderness in particular, he has no CVA tenderness.  LYMPH NODE:  He has got no cervical, axillary or inguinal  lymphadenopathy noted.  MUSCULOSKELETAL:  He has got no warm, swelling or erythema around the  joints.  No spinal tenderness noted.  NEUROLOGICAL:  He has got no focal  neurological deficits.  Cranial nerves II-XII are grossly intact.  DTRs  are 2+ bilaterally upper and lower extremities.  PSYCHIATRIC:  He is alert and oriented x3, no cognitive deficits and the  patient has good insight, recent and remote recalls within normal  limits.   ASSESSMENT AND PLAN:  1. Leukocytosis.  2. Radiologic findings of cholecystitis although the patient has no      clinical symptoms except for back pain.  3. Elevated D-dimer.  4. Urinary tract infection.   I think that the patient's elevated white blood cell count is likely  related to his cholecystitis in addition to the UTI.  The patient will  be started on Cipro which will cover both intra-abdominal pathogens and  urinary pathogens.  I do believe that the patient will require a  cholecystectomy as the cause for his pain and I will defer to the  surgeons on this.   In terms of end-stage renal disease Nephrology will be consulted to  continue his dialysis while hospitalized.  In terms of anemia we will  monitor the patient's hemoglobin and a decision will be made as to  whether or not the patient will require transfusion prior to discharge.  In terms of his diabetes the patient will be placed on a sliding scale  insulin and his Lantus dosing as he does at home.      Altha Harm, MD  Electronically Signed     MAM/MEDQ  D:  08/12/2008  T:  08/12/2008  Job:  [id number]

## 2011-05-03 NOTE — H&P (Signed)
[name], [name]               ACCOUNT NO.:  [id number]   MEDICAL RECORD NO.:  [id number]          PATIENT TYPE:  INP   LOCATION:  5501                         FACILITY:  [facility]   PHYSICIAN:  Altha Harm, MDDATE OF BIRTH:  November 29, 1941   DATE OF ADMISSION:  10/09/2007  DATE OF DISCHARGE:                              HISTORY & PHYSICAL   CHIEF COMPLAINT:  Shortness of breath and dyspnea on exertion.  The  patient sent over by primary care physician.   HISTORY OF PRESENT ILLNESS:  This is a 70 year old gentleman, with a  history of hypertension, who refused the physician's office today and  had laboratory studies done.  According to reports, the patient was  called by his physician and told to report to the emergency room for  further evaluation.  Upon arrival here to the emergency room, the  patient was found to have renal failure and also had complaints of  dyspnea on exertion and shortness of breath.  According to the patient,  for the last 2 weeks, he has noted that he has been having increasing  shortness of breath and dyspnea on exertion.  He also complains of a  chest pain, which has been ongoing intermittently for the last 2 weeks,  and he states occurs most noticeably when he is having dyspnea on  exertion.  The patient states that the pain is sometimes a pressure and  sometimes a sharp pain.  He is unable to quantify the intensity and the  only provocative feature that he can give is associated with dyspnea.  The patient denies any diaphoresis at the time of the pain and states  that the pain is currently not present.   He states that the pain has not been present today.   The only medications that the patient has recently had adjusted is that  the patient was started on Januvia recently for his diabetes.  The  patient has not had a stress test or an echocardiogram in the last year.   PAST MEDICAL HISTORY:  Significant for:  1. Diabetes type 2.  2.  Hypertension.  3. Osteoarthritis status post right total knee arthroplasty.  4. Gout.   FAMILY HISTORY:  Insignificant in a patient this age.   SOCIAL HISTORY:  The patient resides at home with his wife.  There is no  tobacco, alcohol or drug use.   CURRENT MEDICATIONS:  Include the following:  1. Allopurinol.  2. Bumex.  3. Diltiazem.  4. Glyburide.  5. Iron.  6. Vitamin C.  7. Januvia.  All doses are unknown to the patient.   ALLERGIES:  PENICILLIN.   PRIMARY CARE PHYSICIAN:  Massie Maroon, MD   REVIEW OF SYSTEMS:  The patient denies any dizziness, any loss of  consciousness, seizure activity, headaches.   Denies any weight loss or weight gain.  He denies any heat or cold  intolerance.  He denies any diarrhea, constipation.  The patient denies  any difficulty with chewing, any mouth pain, difficulty with swallowing  or any exudate within the mouth.  The patient denies any orthopnea or  PND.  Please see history of present illness for respiratory system and  cardiac system.  He denies any abdominal pain, any constipation or  diarrhea.  He denies any dysuria, any increased frequency or any  urgency.  The patient has longstanding swelling in his bilateral lower  extremities and states that this is no more increased than usual.  All  other systems negative except as noted in the HPI.   Review of old records shows that the patient had a 2D echocardiogram  performed on April of 2007, which showed an estimated LV function of 55  to 65, mild aortic valve sclerosis, mild mitral regurgitation and mildly  increase left atrial diameter.  It showed normal LV wall thickness.   LABORATORY DATA:  Laboratory studies reviewed showed that in August of  2008, the patient had a white blood cell count of 8.7.  Hemoglobin of  12.2.  Hematocrit of 36.8.  Platelet count of 266 and at the same time,  he had a sodium of  140.  Potassium of 4.3.  Chloride of 103.  Bicarb  20.  BUN 37 and creatinine  2.13.  Laboratory studies shown today in the  emergency room shows a white blood cell count of 20.3.  Hemoglobin of  10.5.  Hematocrit of 32.4.  Platelet count of 371.  Sodium of 137.  Potassium 4.3.  Chloride 101.  Bicarb 22.  BUN 9.  Creatinine 4.42.   A chest x-ray shows bibasilar pleural effusions and left bibasilar  atelectasis.   Urinalysis done in the emergency room shows 3-6 WBCs and 21-50 RBCs.  Please also note that the brain natriuretic peptide is elevated at 540.  CK of 29.  CK MB of 2.7 and troponin of 0.04.   PHYSICAL EXAMINATION:  GENERAL:  The patient is laying in bed with  oxygen 2 liters nasal cannula in place.  The patient appears to be in  some mild respiratory distress, with some mild compensation dyspnea.  VITAL SIGNS:  His blood pressure is 136/82.  Heart rate 97.  Temperature  97.1.  Respiratory rate 22.  O2 saturations are 97% with 2 liters nasal  cannula.  HEENT:  The patient is normocephalic, atraumatic.  Pupils are equally  round and reactive to light and accommodation.  Extraocular movements  are intact.  Tympanic membranes are transitioned bilaterally with good  landmarks.  Oropharynx is moist.  No exudate, erythema or lesions are  noted.  NECK EXAMINATION:  Trachea is midline.  No masses.  No thyromegaly.  No  JVD.  No carotid bruit.  RESPIRATORY EXAMINATION:  The patient has some mild increase in  accessory muscle use.  He has equal excursion bilaterally.  The patient  has rales in the bilateral lower lobes of the lungs diffusely.  He has  no wheezing present.  CARDIOVASCULAR:  He has a normal S1 and S2 noted.  No murmurs, rubs or  gallops are noted.  PMI appears nondisplaced with no increased  intensity.  There were no heaves or trills on palpation.  ABDOMINAL EXAMINATION:  Markedly obese.  Soft, nontender, nondistended.  No masses.  No hepatosplenomegaly.  Please note that the exam is limited  secondary to the obesity of the abdomen.  The patient  states that he has  had no increase in abdominal girth recently.  EXTREMITIES:  Bilateral lower extremities:  The patient has  lichenification of the bilateral lower extremities, and he has about 3+  nonpitting edema in the bilateral lower extremities.  MUSCULOSKELETAL:  He has no warmth, swelling or erythema around the  joints.  NEUROLOGICAL:  The patient has no focal neurological deficits.  Cranial  nerves II-XII are grossly intact.  The patient was able to move  bilateral lower extremities against gravity.  Strength in bilateral  upper extremities is 3+ to 4- out of 5.  DTRs are 2+ in bilateral upper  and lower extremities.   ASSESSMENT/PLAN:  This is a patient who presents with:  1. Pulmonary edema.  2. Renal failure.  3. Leukocytosis.   The patient has pulmonary edema, which is likely related to diastolic  dysfunction.  We will get a 2D echocardiogram on the patient.  We will  give the patient diuretics to improve diuresis and improve his  respiratory status.  We will also continue to follow the renal function,  which hopefully will improve with diuresis.  We will saline lock IV  fluids at this time.  In terms of his diabetes, the patient will be  placed on glyburide 2.5 mg daily and a sliding scale of insulin.  I am  going to go ahead and put the patient on a dose of Bumex, which the  patient's wife recalls as being 10 mg.  I also add Zaroxolyn for further  diuresis.   Right now, we will hold the allopurinol in light of the patient's renal  insufficiency, and we will obtain the whole medication list from the  patient's wife in the morning.  The patient will have his enzymes cycled  to rule out any acute cardiac condition.      Altha Harm, MD  Electronically Signed     MAM/MEDQ  D:  10/09/2007  T:  10/10/2007  Job:  [id number]   cc:   Massie Maroon, MD

## 2011-05-03 NOTE — Consult Note (Signed)
[name], [name]               ACCOUNT NO.:  [id number]   MEDICAL RECORD NO.:  [id number]          PATIENT TYPE:  INP   LOCATION:  5501                         FACILITY:  [facility]   PHYSICIAN:  Cecille Aver, M.D.DATE OF BIRTH:  25-Apr-1941   DATE OF CONSULTATION:  DATE OF DISCHARGE:                                 CONSULTATION   REQUESTING PHYSICIAN:  [facility].   REASON FOR REFERRAL:  Acute renal failure and chronic kidney disease.   HISTORY OF PRESENT ILLNESS:  [name] is a 70 year old white male with  past medical history significant for longstanding type 2 diabetes  mellitus for at least 20 years, hypertension, COPD, as well as CKD with  baseline creatinine in the 2's for at least 1-1/2 years.  He was in his  usual state of health until he describes an episode approximately 3  weeks prior to admission when he had left-sided chest pain and left arm  pain associated with diaphoresis, nausea and vomiting.  The patient put  off going to his doctor with these complaints for approximately 3 weeks.  He then presented to his MD when he was having progressive shortness of  breath and dyspnea on exertion.  Labs at that time revealed a creatinine  up into the 4's, so he was sent to the emergency department for further  evaluation.  In the emergency department, his creatinine was 4.42 and  his BNP was 540.  He was admitted to  the hospitalist service and placed  on IV diuretics.  He has diuresed well, creatinine is down to 4.08 and  BNP is down to 391.  He feels much symptomatically improved.  Urinalysis  does show protein as well as microscopic hematuria.   The patient denies any use of nonsteroidals and was not on an ACE  inhibitor.  He denies any dysuria or hematuria.   PAST MEDICAL HISTORY:  1. Type 2 diabetes mellitus for 20 years.  He does not have evidence      of retinopathy.  2. Hypertension for at least 5 years.  He is not really on a specific      blood pressure  agent at this time.  3. CKD with a creatinine of about 2 that has been stable for about 1-      1/2 years.  4. History of COPD, but he is not currently on oxygen or inhalers.  5. 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   CURRENT MEDICATIONS:  1. Baby aspirin 1 a day.  2. Bumex 5 mg IV daily.  3. Cipro 400 mg IV q.24 h.  4. Lovenox.  5. Iron.  6. DiaBeta 2.5 mg daily.  7. Zaroxolyn 2.5 daily.  8. Protonix 40 mg a day.   ALLERGIES:  1. PENICILLIN.  2. SULFA.  3. METFORMIN.   SOCIAL HISTORY:  Quit tobacco about 20 years ago.  There is no tobacco  currently.  He denies significant alcohol use.  He describes that his  activity is limited because he is heavy, but he does go out to the  mailbox and does what he can.  FAMILY HISTORY:  Negative for renal disease.   REVIEW OF SYSTEMS:  Positive for shortness of breath, dyspnea on  exertion, lower extremity edema and chest pain.  Negative for nausea,  vomiting, diarrhea, constipation, headache, hematemesis, melena,  abdominal pain, frequency, hematuria, dysuria.  Otherwise, review of  systems is negative.   PHYSICAL EXAMINATION:  VITAL SIGNS:  The patient is afebrile.  Blood  pressure 140/74, heart rate is 94, respirations 18, oxygen saturation is  96% on 2 liters.  CBGs are good.  Urine output is recorded at 2800 in  the last several hours.  GENERAL:  This is an obese white male who is in no acute distress.  He  is on nasal cannula oxygen.  HEENT:  Pupils are equal, round, reactive to light.  Extraocular motions  are intact.  Mucous membranes are moist. There is jugular venous  distention.  There is no lymphadenopathy.  LUNGS:  Decreased breath sounds at the bases.  CARDIOVASCULAR:  Regular rate and rhythm without murmur, gallop or rub.  ABDOMEN:  Positive bowel sounds, soft, nontender.  Abdomen is quite  obese.  He does have abdominal wall edema.  EXTREMITIES:  Reveal 2-3+ edema.  He does have venostasis changes in his   lower legs.  NEURO:  He is alert.  The remainder of the neurologic exam is nonfocal.   LABORATORY DATA:  TSH is 1.3, BNP is 391, white blood count is 18.7,  hemoglobin 9.4, platelets of 408.  Urinalysis showed 30 protein and  microscopic hematuria.  This is currently being treated for UTI.   DIAGNOSTICS:  1. The patient just came back from a renal ultrasound and the results      of that are pending.  2.  Results from echocardiogram are also      pending.   ASSESSMENT:  A 70 year old white male with baseline chronic kidney  disease stage III likely secondary to diabetes mellitus as well as  hypertension.  This had been stable since the summer of 2007.  The  patient now has acute on chronic renal failure which appears to be  associated with acute myocardial infarction.  1. Acute renal failure.  This has progressed since the episode 3 weeks      ago that was very characteristics for an acute myocardial      infarction.  He has had progressive congestive heart failure since.      With current treatment with IV diuretics, the patient is diuresing      and  his creatinine is improving.  I really have no recommendations      as far as diuresis and will not be changing any of those medicines      at this time.  2. Anemia could be associated with stage III chronic kidney disease.      We will check iron stores and consider Aranesp.  3. Diabetes mellitus is under fair control at this time.  4. Possible urinary tract infection versus Foley trauma with      microscopic hematuria.  The patient is on a course of Cipro.  His      urine output appears perfectly clear.   Thank you for this consultation.  We will continue to follow with you.           ______________________________  Cecille Aver, M.D.     KAG/MEDQ  D:  10/10/2007  T:  10/11/2007  Job:  [id number]

## 2011-05-03 NOTE — Consult Note (Signed)
NAMEDELTA, DESHMUKH               ACCOUNT NO.:  [id number]   MEDICAL RECORD NO.:  [id number]          PATIENT TYPE:  INP   LOCATION:  5501                         FACILITY:  [facility]   PHYSICIAN:  Peter M. Swaziland, M.D.  DATE OF BIRTH:  September 19, 1941   DATE OF CONSULTATION:  DATE OF DISCHARGE:                                 CONSULTATION   HISTORY OF PRESENT ILLNESS:  [name] is a pleasant 70 year old  white male with history of chronic hypertension and diabetes who was  admitted with new onset of congestive heart failure, worsening renal  insufficiency.  The patient has no known history of coronary disease or  congestive heart failure.  He has been on chronic Bumex therapy.  He  states he has not been on an ACE inhibitor or angiotensin receptor  blocker and has avoided any nonsteroidal anti-inflammatory drugs.  He  presents with a 2-week history of worsening dyspnea on exertion  associated with increased lower extremity edema and increased weight  gain.  He denies orthopnea or PND. He has had no fever, cough or chills.  He has had 2 recent episodes of atypical chest pain, one occurring in  his left neck and clavicle area while in church and another time in a  restaurant where he complained of mid chest pain while bending over.  Since his admission the patient has responded well to IV diuresis with  significant improvement in his dyspnea and edema.  The patient has had a  prior echocardiogram in April 2007 which showed a normal ejection  fraction of 55-60 with mild mitral insufficiency and mild left atrial  enlargement.  Echocardiogram this admission demonstrated ejection  fraction of 40% with inferior and posterior hypokinesia.  He had  moderate mitral annular calcification, mild left atrial enlargement and  a small to moderate pericardial effusion.   PAST MEDICAL HISTORY:  1. Diabetes mellitus type 2.  2. Chronic anemia.  3. Chronic renal insufficiency.  4. Status post right  total hip replacement.  5. Hypertension.  6. Chronic obstructive pulmonary disease.  7. Osteoarthritis.  8. Gout.  9. Morbid obesity.  10.History of renal calculi.   ALLERGIES:  To PENICILLIN, SULFA.   MEDICATIONS:  Prior to admission included  1. Allopurinol 300 mg per day  2. Bumex 4 mg per day  3. Diltiazem XR 240 mg per day  4. Glyburide daily  5. Iron and vitamin C supplements  6. Januvia daily.   SOCIAL HISTORY:  The patient has a 30 pack-year history of smoking but  quit in 1986.  He is married.  He is retired.   FAMILY HISTORY:  Is noncontributory.   His review of systems are as noted in HPI, otherwise  negative.   PHYSICAL EXAMINATION:  The patient is a morbidly obese white male in no  apparent distress.  Blood pressure 136/76, pulse 104, in sinus rhythm.  He is afebrile.  HEENT:  Exam is unremarkable.  NECK:  He has no jugular venous distention or bruits.  LUNGS:  Reveal diminished breath sounds in both bases without active  rales.  CARDIAC:  Exam reveals irregular rate and rhythm with distant heart  sounds.  There is no S3 or murmur.  ABDOMEN is morbidly obese, soft, nontender.  EXTREMITIES reveal 1+ edema.  He has chronic lichen planus.  NEUROLOGIC:  Exam is intact.   LABORATORY DATA:  His chest x-ray showed cardiomegaly with pleural  effusions left greater than right and mild edema.  ECG showed normal  sinus rhythm with frequent PVCs, otherwise normal.  Renal ultrasound  showed A nonobstructive right renal stone and benign cyst.  White count  is 15,400, hemoglobin 9.7, hematocrit 30, platelets 478,000.  Sodium is  137, potassium 4.1, chloride 99, CO2 24, BUN 83, creatinine 4.12,  glucose is 163.  Cardiac enzymes were negative times one.  BNP level has  ranged from 540 to 391 to 500.  TSH is 1.303.   IMPRESSION:  1. New onset of congestive heart failure with systolic and diastolic      dysfunction exacerbated by worsening renal function and clearance.       Etiology of his decreased LV function is probably due to chronic      diabetes and hypertension, but also need to consider a possible      ischemic component given regional wall motion abnormality on echo.  2. Acute on chronic kidney disease.  3. Anemia.  4. Diabetes mellitus type 2.  5. Hypertension.  6. Morbid obesity.   PLAN:  I agree with diuresis as you are doing.  The patient is  clinically improving.  He is not a candidate for ACE inhibitors or  angiotensin receptor blockers due to his renal disease.  I would  discontinue his Diltiazem given its negative anatrophic effects.  We  will start low-dose Coreg and titrate up as tolerated. We will also add  a long-acting nitrate given the possibility of ischemic heart disease.  At this point there is no pressing need for ischemic workup as he is  clinically improving and has no active angina.  It will be appropriate  to do a pharmacologic stress test in the future to evaluate his ischemic  risk but as he is improving, I think this can be done as an outpatient.  Given his worsening renal function,  would not perform a contrast-based  study such as cardiac catheterization unless his clinical status were to  deteriorate.  The patient will need to remain on a renal diet with  sodium and fluid restriction.           ______________________________  Peter M. [place], M.D.     PMJ/MEDQ  D:  10/11/2007  T:  10/12/2007  Job:  [id number]   cc:   Wilson Singer, M.D.  Massie Maroon, MD  Dyke Maes, M.D.

## 2011-05-03 NOTE — Discharge Summary (Signed)
[name], [name]               ACCOUNT NO.:  [id number]   MEDICAL RECORD NO.:  [id number]          PATIENT TYPE:  INP   LOCATION:  4741                         FACILITY:  [facility]   PHYSICIAN:  Marcellus Scott, MD     DATE OF BIRTH:  1941/06/27   DATE OF ADMISSION:  10/09/2007  DATE OF DISCHARGE:                               DISCHARGE SUMMARY   INTERIM DISCHARGE SUMMARY:   DATE OF DISCHARGE:  To be determined.   PRIMARY CARE PHYSICIAN:  Massie Maroon, MD.   This is an addendum to the discharge summary that was done by Dr.  Karilyn Cota on October 16, 2007.  It will outline the inpatient care since  October 17, 2007 to date.   DISCHARGE DIAGNOSES:  1. New end-stage renal disease on hemodialysis Tuesday, Thursday,      Saturday.  2. Klebsiella urinary tract infection.  3. Leukocytosis.  4. Compensated congestive heart failure.  5. Uncontrolled diabetes.  6. Anemia.  7. Secondary hyperparathyroidism.  8. Atrial arrhythmias, resolved.  9. Venous left leg ulcer.   DISCHARGE MEDICATIONS:  To be dictated at discharge.   PROCEDURE:  October 20, 2007, chest x-ray.  Impression:  Improving left  lower lobe aeration.   PERTINENT LABORATORY DATA:  Will be finalized on discharge.  C.  difficile negative.  Urine culture with Klebsiella oxytoca.  Blood  cultures are negative to date.  Iron studies were iron of 40, total iron  binding capacity 320, percent saturation 13.  Hepatitis B surface  antigen negative.  Parathormone intact 111.5.  Calcium 9.5.   CONSULTATIONS:  1. Vascular surgery, Dr. Madilyn Fireman.  2. Nephrology, Dr. Hyman Hopes and Dr. Darrick Penna.  3. Cardiology, Dr. Reyes Ivan.   HOSPITAL COURSE AND DISPOSITION:  Since October 17, 2007, nephrology and  cardiology continued to follow the patient.  Patient was feeling much  better with no dyspnea or chest pain.  He continued to remain in sinus  rhythm. Heparin was discontinued and his amiodarone was initially  decreased in dosage.  However,  after his pulmonary function tests  revealed abnormality of diffusion and obstructive pattern, amiodarone  was also discontinued.  Since then, patient has continued to be mainly  in sinus rhythm with periodic bigeminy.  For an episode of  unresponsiveness and drop in blood pressure while on dialysis, his Coreg  has been held.  Patient also had an episode of hypotension today after  dialysis.  Patient is being dialyzed with a right IJ catheter.  However,  vascular consult was done and patient had a left AV graft placed.  Patient, however, was oozing blood from both the IJ site as well as the  graft site following which compression dressings were placed on both  with improvement. Vascular surgery reviewed the patient's graft and do  not think it was infected.  However, in the interim, patient was noted  to have increasing leukocytosis to as high as 29,000. Patient did not  have any fever.  Septic work-up was done.  The chest x-ray was  unremarkable.  His blood culture are negative to date, however, his  urine  culture grew out Klebsiella.  He was empirically placed on Fortaz  and vancomycin.  This will be tailored down to ceftriaxone alone to  which Klebsiella is sensitive.  His white cell count continued to  decrease.  Once the sepsis is adequately controlled, patient should be  stable to be discharged and followed up as an outpatient.  Patient's  aspirin is also on hold, to consider resuming it now that bleeding is  abated. Wound care also saw the patient and made recommendations for  care of his venous leg ulcer which is much improved.      Marcellus Scott, MD  Electronically Signed     AH/MEDQ  D:  10/23/2007  T:  10/23/2007  Job:  [id number]   cc:   Massie Maroon, MD  P. Liliane Bade, M.D.  Garnetta Buddy, M.D.  Elmore Guise., M.D.

## 2011-05-03 NOTE — Consult Note (Signed)
[name], [name]               ACCOUNT NO.:  [id number]   MEDICAL RECORD NO.:  [id number]          PATIENT TYPE:  INP   LOCATION:  4741                         FACILITY:  [facility]   PHYSICIAN:  Di Kindle. Edilia Bo, M.D.DATE OF BIRTH:  1941-04-25   DATE OF CONSULTATION:  10/18/2007  DATE OF DISCHARGE:                                 CONSULTATION   REASON FOR CONSULTATION:  Need for hemodialysis access.   Consult is from Dr. Darrick Penna.   HISTORY:  This is a pleasant 70 year old gentleman who was admitted on  October 09, 2007 after he had presented with a 2-week history of  shortness of breath and dyspnea on exertion.  He was admitted and found  to be in congestive heart failure.  He was diuresed.  He developed a  rising creatinine, and vascular surgery was consulted for hemodialysis  access.  Of note, he had a right IJ Diatek catheter placed by the  radiologist.  His end-stage renal disease was secondary to diabetes.  His shortness of breath has improved significantly.   His past medical history is significant for:  1. Congestive heart failure, systolic and diastolic.  2. Atrial flutter, now in sinus rhythm.  3. End-stage renal disease.  4. Anemia of chronic disease.  5. Diabetes mellitus.  6. Obesity.   SOCIAL HISTORY:  The patient quit tobacco about 20 years ago.   REVIEW OF SYSTEMS:  PULMONARY:  He does admit to dyspnea on exertion.  He had no recent bronchitis, asthma or wheezing.  CARDIAC:  He had no  chest pain or chest pressure.   PHYSICAL EXAMINATION:  His blood pressure is 118/60, heart rate is 73.  LUNGS:  Have decreased breath sounds at the bases.  He has a palpable brachial and radial pulse bilaterally.   He has had cephalic vein mapping which showed no usable cephalic vein in  the forearm or upper arm in the left arm.  On the right arm, he had a  small forearm cephalic vein with no identifiable upper arm cephalic  vein.   I have recommended he have a left  AV graft placed.  He has an IV in the  left arm which we will remove.  I have discussed indications of the  procedure and the potential complications including but not limited to  bleeding, thrombosis, steal syndrome, and infection.  All of his  questions were answered, and he is agreeable to proceed.      Di Kindle. Edilia Bo, M.D.  Electronically Signed     CSD/MEDQ  D:  10/18/2007  T:  10/19/2007  Job:  [id number]

## 2011-05-03 NOTE — Op Note (Signed)
[name], [name]               ACCOUNT NO.:  [id number]   MEDICAL RECORD NO.:  [id number]          PATIENT TYPE:  AMB   LOCATION:  SDS                          FACILITY:  [facility]   PHYSICIAN:  Quita Skye. Hart Rochester, M.D.  DATE OF BIRTH:  [birth year]   DATE OF PROCEDURE:  12/10/2007  DATE OF DISCHARGE:  12/10/2007                               OPERATIVE REPORT   PREOPERATIVE DIAGNOSES:  Thrombosed AV Gore-Tex graft left arm with end-  stage renal disease.   POSTOPERATIVE DIAGNOSES:  Thrombosed AV Gore-Tex graft left arm with end-  stage renal disease.   OPERATION:  Thrombectomy AV Gore-Tex graft left forearm with insertion  of new segment of graft from existing graft to basilic vein with a 6-mm  Gore-Tex.   SURGEON:  Dr. Hart Rochester.   FIRST ASSISTANT:  Nurse.   ANESTHESIA:  Local.   PROCEDURE:  The patient was taken to the operating room, placed in the  supine position at which time the left upper extremity was prepped with  Betadine scrub and solution, draped in a routine sterile manner.  After  infiltration with 1% Xylocaine with epinephrine, a longitudinal incision  was made through the antecubital area, Gore-Tex to antecubital vein  dissected free.  There was a large cephalic and basilic branch both of  which were 5 mm in size.  The basilic vein was easily thrombectomized  with a #5 Fogarty with no areas of obstruction noted in the proximal  vein.  The cephalic vein was also thrombectomized although there was  some irregularity in the mid upper arm.  There was also some more debris  which had been built up at the origin of the cephalic than the basilic  and it was decided to ligate the cephalic and use only the basilic as  outflow since both were large branches.  The graft itself was easily  thrombectomized, a new piece of 6-mm graft anastomosed end-to-end to the  old graft and end-to-end to the proximal vein with 6-0 Prolene. The  clamp was then released and there was an excellent  pulse and palpable  thrill in the graft.  No heparin or protamine was given.  The wound was  irrigated with saline, closed in layers with Vicryl in a subcuticular  fashion, sterile dressing applied.  The patient was taken to the  recovery room in satisfactory condition.      Quita Skye Hart Rochester, M.D.  Electronically Signed     JDL/MEDQ  D:  12/10/2007  T:  12/10/2007  Job:  [id number]

## 2011-05-03 NOTE — Op Note (Signed)
[name], [name]               ACCOUNT NO.:  [id number]   MEDICAL RECORD NO.:  [id number]          PATIENT TYPE:  INP   LOCATION:  4741                         FACILITY:  [facility]   PHYSICIAN:  Quita Skye. Hart Rochester, M.D.  DATE OF BIRTH:  [birth year]   DATE OF PROCEDURE:  10/19/2007  DATE OF DISCHARGE:                               OPERATIVE REPORT   PREOPERATIVE DIAGNOSIS:  End-stage renal disease.   POSTOPERATIVE DIAGNOSIS:  End-stage renal disease.   OPERATION:  Insertion of left forearm brachial artery to antecubital  vein AV Gore-Tex graft (4 mm - 7 mm stretch).   SURGEON:  Quita Skye. Hart Rochester, M.D.   FIRST ASSISTANT:  Nurse.   ANESTHESIA:  Local.   PROCEDURE:  The patient was taken to the operating room and placed in  the supine position at which time the left upper extremity was prepped  with Betadine scrub and solution and draped in the routine sterile  manner.   After infiltration with 1% Xylocaine with epinephrine, a transverse  incision was made in the antecubital area and the antecubital vein  dissected free.  It was 5 mm in size up to the cephalic branch and  seemed adequate for a graft, although it was slightly deep in location  and had been deemed inadequate for a fistula preoperatively by vein  mapping.  The brachial artery was exposed beneath the fascia and  encircled with vessel loops.  The graft was then tunneled in a loop-  shaped fashion, making a small counterincision at the apex of the loop  and a 4 x 7-mm stretch Gore-Tex graft delivered through the tunnel.  The  artery was occluded proximally and distally with vessel loops and with a  opened 15 blade and extended with the Potts scissors.  The 4-mm end of  the graft was spatulated and anastomosed end-to-side with 6-0 Prolene.  The vein was then opened in a similar fashion.  The 7-mm end of the  graft was spatulated and anastomosed end-to-side with 6-0 Prolene.  The  clamps were then released.  There was an  excellent pulse and thrill in  the graft.  No protamine was given.  The wound was irrigated with saline  and closed in layers with Vicryl in a subcuticular fashion.  A sterile  dressing applied.   The patient taken to the recovery room in satisfactory condition.      Quita Skye Hart Rochester, M.D.  Electronically Signed     JDL/MEDQ  D:  10/19/2007  T:  10/21/2007  Job:  [id number]

## 2011-05-06 NOTE — H&P (Signed)
NAMEJORDEN, [name]               ACCOUNT NO.:  [id number]   MEDICAL RECORD NO.:  [id number]           PATIENT TYPE:   LOCATION:                                 FACILITY:   PHYSICIAN:  Madlyn Frankel. Charlann Boxer, M.D.  DATE OF BIRTH:  27-Jan-1941   DATE OF ADMISSION:  06/15/2006  DATE OF DISCHARGE:                                HISTORY & PHYSICAL   CHIEF COMPLAINT:  Pain in my right hip.   HISTORY OF PRESENT ILLNESS:  This 70 year old white male seen by [name] for  continuing and progressive problems concerning pain in his right hip.  This  has been going on for many years and has progressed to the point now where  he has marked interfering with his day to day activities to the point where  he can't even accompany his wife shopping.  He had to retire from his work  due to the fact that he had to rest frequently.  X-rays have shown  deterioration of the joint.  This gentleman is having difficulty with  sleeping, getting about, and is really dissatisfied with his overall life.  After much consideration including the risks and benefits of surgery, it was  felt he would benefit from surgical intervention, being admitted for right  total hip replacement arthroplasty.  He plans home health with his wife,  [name], caring for him.  He has had cleared preoperatively by Dr. Dimas Alexandria of [facility].  He has had spirometry studies,  electrocardiograms, echocardiogram report read by Dr. Audrie Lia.  Dr.  Lendell Caprice has essentially cleared the patient for surgery.  He recommends  that we contact the [facility] for any medical problems in the  hospital and Dr. Marcelyn Bruins for any pulmonary assistance.   PAST MEDICAL HISTORY:  1.  This gentleman has worked in [employer] and has      developed COPD.  2.  He also has hypertension, nocturia, and type 2 diabetes.  3.  He has no medical allergies.   MEDICATIONS:  1.  Amlodipine 5 mg daily.  2.   GlyB/Metform 2.5/500 mg daily.  3.  __________  5 mg daily.  4.  Allopurinol 300 mg daily.  5.  Bumetanide 2 mg daily.   His wife will be a major caregiver after surgery along with home health.   REVIEW OF SYSTEMS:  CNS:  No seizure, stroke, paralysis, and no chest pain,  no angina nor orthopnea.  GASTROINTESTINAL:  No nausea, vomiting, melena, or  bloody stool.  GENITOURINARY:  The patient has nocturia, no hematuria.  No  dysuria.   PHYSICAL EXAMINATION:  GENERAL:  Alert, cooperative, friendly, obese 70-year-  old white male.  VITAL SIGNS:  Blood pressure 124/72, pulse 80, respirations __________  rhythm, occasional extra systoles.  ABDOMEN:  Obese, soft, nontender.  Liver, spleen not felt.  GENITALIA:  Rectal not done, not pertinent to present illness.  EXTREMITIES:  Must use a walker for ambulation.   ADMITTING DIAGNOSES:  1.  Severe end-stage osteoarthritis of the right hip.  2.  Type 2 diabetes.  3.  Hypertension.  4.  Nocturia and urgency.  5.  Chronic obstructive pulmonary disease.   PLAN:  The patient will undergo right total hip replacement arthroplasty.  We plan to have him with home health.  Should he have any medical problems,  we will certainly call the __________.      [name] June.      Madlyn Frankel Charlann Boxer, M.D.  Electronically Signed    DLU/MEDQ  D:  06/05/2006  T:  06/05/2006  Job:  [id number]   cc:   Janae Bridgeman. Eloise Harman., M.D.  Fax: [phone]

## 2011-05-06 NOTE — H&P (Signed)
[name], [name]               ACCOUNT NO.:  [id number]   MEDICAL RECORD NO.:  [id number]           PATIENT TYPE:   LOCATION:                                 FACILITY:   PHYSICIAN:  Madlyn Frankel. Charlann Boxer, M.D.  DATE OF BIRTH:  [birth year]   DATE OF ADMISSION:  06/15/2006  DATE OF DISCHARGE:                                HISTORY & PHYSICAL   CHIEF COMPLAINT:  Pain in my right hip.   HISTORY OF PRESENT ILLNESS:  This 70 year old white male with continuing  problems concerning pain in his right hip.  He has had more and more  difficulty with getting about.  His hip has been bothering him for years.  As a matter of fact, that was one of the major reasons for him retiring.  He  now has marked interfering with his day to day activities.  He is afraid to  even go into large stores such as [employer], etc., due to the fact that he  really cannot get around.  Prefers to sit in his truck while his wife does  the shopping.  This is certainly an unsatisfactory condition.  He has had  cortisone injection intraarticular to the hip with short term results.  After much consideration including the risks and benefits of surgery and  having preoperative clearance by Dr. Higinio Plan of [facility], the patient is scheduled for total hip replacement arthroplasty  on the right.  Recently, he has had pulmonary studies with spirometry, read  by Dr. Aggie Cosier.      [name] June.      Madlyn Frankel Charlann Boxer, M.D.  Electronically Signed    DLU/MEDQ  D:  06/05/2006  T:  06/05/2006  Job:  [id number]

## 2011-05-06 NOTE — Discharge Summary (Signed)
[name], [name]               ACCOUNT NO.:  [id number]   MEDICAL RECORD NO.:  [id number]          PATIENT TYPE:  INP   LOCATION:  1513                         FACILITY:  [facility]   PHYSICIAN:  Madlyn Frankel. Charlann Boxer, M.D.  DATE OF BIRTH:  18-Aug-1941   DATE OF ADMISSION:  06/15/2006  DATE OF DISCHARGE:  06/19/2006                                 DISCHARGE SUMMARY   ADMITTING DIAGNOSES:  1.  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  2.  Type 2 diabetes.  3.  Hypertension.  4.  Nocturia and urgency.  5.  Chronic obstructive pulmonary disease.   DISCHARGE DIAGNOSIS:  1.  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  2.  Type 2 diabetes.  3.  Hypertension.  4.  Nocturia and urgency.  5.  Chronic obstructive pulmonary disease.  6.  Postoperative anemia.   OPERATION:  On [name] 28, 2007, the patient underwent right total hip  replacement arthroplasty utilizing DePuy hip system with metal-on-metal,  [name] PA-C assisted.   BRIEF HISTORY:  This 70 year old gentleman having pain in his right hip with  severe interference with his day-to-day activity.  He has been cleared for  surgical intervention and after much discussion including the risks and  benefits of surgery, it was decided to go ahead with the above procedure.   COURSE IN THE HOSPITAL:  The patient tolerated the surgical procedure quite  well.  Chest x-ray noted a nodule in his preoperative films. This report was  sent to Dr. Pincus Sanes office, his family physician.   Orthopedically, the patient did well postoperatively, working with physical  therapy in the hospital.  He did have a slightly elevated creatinine, this  was baseline coronary Dr. Pincus Sanes records.  Arrangements were made for  him to have home health and he was ambulating in the hall, the wound was  clean and dry, having minimal or no discomfort that could not be controlled  with p.o. analgesics.  Arrangements for discharge were made.   LABORATORY VALUES IN THE HOSPITAL:  Preoperative CBC with a mild anemia,  normochromic and normocytic.  Hemoglobin was 11.4, hematocrit 35.8.  Final  hemoglobin was 8.6, hematocrit was 26.7.  Blood chemistries stayed  essentially normal.  He did have his elevated creatinine but this was  essentially his baseline.  Urinalysis showed a small amount of leukocyte  esterase, some hyaline casts were seen.  Electrocardiogram showed sinus  rhythm, occasional premature ventricular complexes, and possible premature  atrial complexes with aberrant conduction.   CONDITION ON DISCHARGE:  Improved, stable.   PLAN:  The patient is discharged to his home to follow up with Dr. Lendell Caprice  in the next week or two.  He will use Tylenol 2 every 6 hours, Robaxin 500  mg for muscle relaxant, oxycodone for breakthrough pain, and Coumadin to  keep the INR around 2.  This will be followed by pharmacy.  He was started  on Coumadin immediately after surgery for the prevention of DVT.  Also, on  Trinsicon as an iron supplement t.i.d.  Return to see Dr. Charlann Boxer two  weeks  after the date of surgery.  Continue with home medications and diet.      [name].      Madlyn Frankel Charlann Boxer, M.D.  Electronically Signed    DLU/MEDQ  D:  07/05/2006  T:  07/05/2006  Job:  [id number]   cc:   Janae Bridgeman. Eloise Harman., M.D.  Fax: [phone]

## 2011-05-06 NOTE — Op Note (Signed)
NAMEJAVONN, [name]               ACCOUNT NO.:  [id number]   MEDICAL RECORD NO.:  [id number]          PATIENT TYPE:  INP   LOCATION:  1513                         FACILITY:  [facility]   PHYSICIAN:  [name] Frankel. Charlann [name], M.D.  DATE OF BIRTH:  May 19, 1941   DATE OF PROCEDURE:  06/15/2006  DATE OF DISCHARGE:                                 OPERATIVE REPORT   NOTE:  The dictation system was down yesterday.   PREOPERATIVE DIAGNOSIS:  Right hip osteoarthritis.   POSTOPERATIVE DIAGNOSIS:  Right hip osteoarthritis.   PROCEDURE:  Right total hip replacement.   COMPONENTS USED:  DePuy hip system, size 52 Pinnacle cup, 36 metal-on-metal  liner, an SROM 22 x 17 with 22-D large sleeve and a 36 +0 ball.  I did use a  36 +8 stem.   SURGEON:  [name] Frankel. Charlann [name], M.D.   ASSISTANT:  Cherly Beach, P.A.   ANESTHESIA:  General.   BLOOD LOSS:  400 mL.   COMPLICATIONS:  None.   DRAINS:  None.   INDICATION FOR PROCEDURE:  [name] is a 70 year old male who I  have been following for some time for his right hip pain.  Medically, he had  had some issues that were prohibitive of surgery initially, but he was  cleared for surgical intervention.  His pain had progressively limited his  quality of life and he chose to proceed with surgical intervention.  We  discussed risks and benefits including his perioperative comorbidities and  risk of infection, DVT, dislocation component failure, and need for revision  surgery.  Consent was obtained.   PROCEDURE IN DETAIL:  The patient was brought to the operative theater.  Once adequate anesthesia and preoperative antibiotics, 2 g of Ancef, were  administered, the patient was positioned in the left lateral decubitus  position with the right side up.  A lateral-based incision was made for a  posterior approach to the hip.  The short external rotators were taken down  separately from the posterior capsule.  Initially, I did save the posterior  capsule for  protection against the sciatic nerve, but was hoping to make a  capsule repair, but based on the amount of arthritis and debridement of  osteophytes, particularly posterior, superior and anterior, I was unable to  maintain the superior leaflet and so capsulotomy was carried out at the end  of the case.  The hip was dislocated and a neck osteotomy was made using the  SROM hip guide based on the center of the femoral head.  The patient was  noted to have extensive degenerative changes.  Attention was first directed  to the acetabulum.  With exposure obtained with movement of the femur  anterior and medial, I was able to expose the acetabulum without difficulty.  The patient was noted to have severe osteophytosis, posterior, superior and  anterior.  I did just initiate my reaming and was able to get an excellent  bony bed with 51-mm reaming.  I marked the level of reaming so as to  identify from the cup position as well as for osteophyte debridement.  A  final 52 cup was then positioned into about 35-40 degrees of abduction and  15-20 degrees for flexion.  I did remove posterior osteophytes as well as  anterior osteophytes at this point, all without difficulty or complication.  a trial liner was positioned and attention was directed the femur.  Femur  preparation was carried out per protocol for the SROM system and initial  canal opening, irrigation to prevent fat emboli and reaming distally.  I had  to ream all the way to a 17 before I got good bony purchase.  I then reamed  to 17.5 three-quarters of the way down.  I then reamed proximally with a 22-  B and then D to get good bony purchase.  I then milled to a large.  A trial  sleeve was positioned with a 22 +17, 36 +8 stem.  I only added a few degrees  of anteversion based on the native stem with a 36 +0 ball.  I was very happy  with the hip stability and compared to the down leg preoperatively, the leg  lengths did not appear to be affected.   Given this, the trial components  were removed and the final 36 metal liner was impacted into a prepared  acetabular shell and impacted without difficulty.   The final 22-D large sleeve was then impacted into the neck cut without  difficulty followed by positioning of the 22 +17, 36 +8 stem.   Given the position of the final components, I did just use a 36 +0 ball and  this was impacted onto a cleaned and dried trunnion.  The hip was reduced.  The hip was irrigated copiously with normal saline solution throughout the  case and again at this point, there was no hemostasis to be obtained and no  drain was used.  Like I noted before, the calf of the superior capsular  tissue was debrided significantly with osteophyte debridement and thus was  not present for the capsule repair.  I did not reapproximate the short  external rotators.  The iliotibial band was reapproximated using #1 Ethibond  and #1 Vicryl was utilized a running fashion on the gluteal fascia.  The  remainder of the wound was closed in layers with a 4-0 Monocryl on the skin.  The hip was cleaned, dried and dressed sterilely with Steri-Strips, dressing  sponges and tape.  The patient tolerated the procedure without complication  and was transferred to the recovery room, extubated.      [name], M.D.  Electronically Signed     MDO/MEDQ  D:  06/16/2006  T:  06/16/2006  Job:  [id number]

## 2011-06-23 ENCOUNTER — Other Ambulatory Visit (HOSPITAL_COMMUNITY): Payer: Self-pay | Admitting: Nephrology

## 2011-06-23 DIAGNOSIS — N186 End stage renal disease: Secondary | ICD-10-CM

## 2011-06-30 ENCOUNTER — Other Ambulatory Visit (HOSPITAL_COMMUNITY): Payer: Self-pay | Admitting: Nephrology

## 2011-06-30 ENCOUNTER — Ambulatory Visit (HOSPITAL_COMMUNITY)
Admission: RE | Admit: 2011-06-30 | Discharge: 2011-06-30 | Disposition: A | Discharge status: Home / Self Care | Payer: MEDICARE | Source: Ambulatory Visit | Attending: Nephrology | Admitting: Nephrology

## 2011-06-30 DIAGNOSIS — N186 End stage renal disease: Secondary | ICD-10-CM

## 2011-06-30 DIAGNOSIS — I120 Hypertensive chronic kidney disease with stage 5 chronic kidney disease or end stage renal disease: Secondary | ICD-10-CM | POA: Insufficient documentation

## 2011-06-30 DIAGNOSIS — M109 Gout, unspecified: Secondary | ICD-10-CM | POA: Insufficient documentation

## 2011-06-30 DIAGNOSIS — M1990 Unspecified osteoarthritis, unspecified site: Secondary | ICD-10-CM | POA: Insufficient documentation

## 2011-06-30 DIAGNOSIS — T82898A Other specified complication of vascular prosthetic devices, implants and grafts, initial encounter: Secondary | ICD-10-CM | POA: Insufficient documentation

## 2011-06-30 DIAGNOSIS — D649 Anemia, unspecified: Secondary | ICD-10-CM | POA: Insufficient documentation

## 2011-06-30 DIAGNOSIS — I4892 Unspecified atrial flutter: Secondary | ICD-10-CM | POA: Insufficient documentation

## 2011-06-30 DIAGNOSIS — Y832 Surgical operation with anastomosis, bypass or graft as the cause of abnormal reaction of the patient, or of later complication, without mention of misadventure at the time of the procedure: Secondary | ICD-10-CM | POA: Insufficient documentation

## 2011-06-30 MED ORDER — IOHEXOL 300 MG/ML  SOLN
100.0000 mL | Freq: Once | INTRAMUSCULAR | Status: AC | PRN
  Administered 2011-06-30: 50 mL via INTRAVENOUS

## 2011-09-08 LAB — POCT I-STAT 4, (NA,K, GLUC, HGB,HCT)
Glucose, Bld: 160 — ABNORMAL HIGH
HCT: 52
Operator id: 136991
Potassium: 3.9
Sodium: 137

## 2011-09-23 LAB — POCT I-STAT 4, (NA,K, GLUC, HGB,HCT)
HCT: 50
Hemoglobin: 17

## 2011-09-27 LAB — CBC
HCT: 33.6 — ABNORMAL LOW
HCT: 34 — ABNORMAL LOW
Hemoglobin: 10.6 — ABNORMAL LOW
Hemoglobin: 10.7 — ABNORMAL LOW
Hemoglobin: 11.1 — ABNORMAL LOW
Hemoglobin: 11.3 — ABNORMAL LOW
Hemoglobin: 11.4 — ABNORMAL LOW
MCHC: 31.9
MCHC: 32
MCHC: 32
MCHC: 32.2
MCHC: 32.4
MCHC: 32.5
MCV: 87.6
MCV: 88.7
MCV: 88.8
Platelets: 304
Platelets: 372
Platelets: 389
Platelets: 403 — ABNORMAL HIGH
Platelets: 423 — ABNORMAL HIGH
Platelets: 424 — ABNORMAL HIGH
RBC: 3.74 — ABNORMAL LOW
RBC: 3.88 — ABNORMAL LOW
RBC: 3.91 — ABNORMAL LOW
RBC: 3.94 — ABNORMAL LOW
RBC: 3.98 — ABNORMAL LOW
RBC: 4.01 — ABNORMAL LOW
RDW: 17.6 — ABNORMAL HIGH
RDW: 17.7 — ABNORMAL HIGH
RDW: 18 — ABNORMAL HIGH
RDW: 18.4 — ABNORMAL HIGH
RDW: 18.7 — ABNORMAL HIGH
RDW: 19 — ABNORMAL HIGH
WBC: 16.7 — ABNORMAL HIGH
WBC: 19.2 — ABNORMAL HIGH
WBC: 22.1 — ABNORMAL HIGH
WBC: 24.9 — ABNORMAL HIGH

## 2011-09-27 LAB — SAVE SMEAR

## 2011-09-27 LAB — DIFFERENTIAL
Basophils Absolute: 0.1
Lymphocytes Relative: 9 — ABNORMAL LOW
Lymphs Abs: 1.8
Neutrophils Relative %: 77

## 2011-09-27 LAB — CULTURE, BLOOD (ROUTINE X 2)
Culture: NO GROWTH
Culture: NO GROWTH

## 2011-09-27 LAB — URINALYSIS, ROUTINE W REFLEX MICROSCOPIC
Glucose, UA: NEGATIVE
Protein, ur: 30 — AB
Urobilinogen, UA: 1

## 2011-09-27 LAB — IRON AND TIBC
Iron: 201 — ABNORMAL HIGH
TIBC: 372

## 2011-09-27 LAB — PROTEIN ELECTROPH W RFLX QUANT IMMUNOGLOBULINS
Alpha-1-Globulin: 7.6 — ABNORMAL HIGH
Alpha-2-Globulin: 13.7 — ABNORMAL HIGH
Beta 2: 7.2 — ABNORMAL HIGH
Beta Globulin: 7.6 — ABNORMAL HIGH
Gamma Globulin: 18.6

## 2011-09-27 LAB — RENAL FUNCTION PANEL
Albumin: 2.7 — ABNORMAL LOW
Albumin: 2.9 — ABNORMAL LOW
Calcium: 9.5
Calcium: 9.5
Creatinine, Ser: 8.6 — ABNORMAL HIGH
Creatinine, Ser: 8.83 — ABNORMAL HIGH
GFR calc Af Amer: 7 — ABNORMAL LOW
GFR calc Af Amer: 8 — ABNORMAL LOW
GFR calc non Af Amer: 6 — ABNORMAL LOW
GFR calc non Af Amer: 6 — ABNORMAL LOW
Phosphorus: 8 — ABNORMAL HIGH
Sodium: 135

## 2011-09-27 LAB — URINE CULTURE: Special Requests: NEGATIVE

## 2011-09-27 LAB — RETICULOCYTES: Retic Count, Absolute: 165.2

## 2011-09-27 LAB — CLOSTRIDIUM DIFFICILE EIA: C difficile Toxins A+B, EIA: NEGATIVE

## 2011-09-27 LAB — IGG, IGA, IGM
IgA: 580 — ABNORMAL HIGH
IgG (Immunoglobin G), Serum: 1550

## 2011-09-27 LAB — URINE MICROSCOPIC-ADD ON

## 2011-09-27 LAB — FERRITIN: Ferritin: 230 (ref 22–322)

## 2011-09-27 LAB — IMMUNOFIXATION ADD-ON

## 2011-09-28 LAB — CBC
HCT: 28.7 — ABNORMAL LOW
HCT: 29 — ABNORMAL LOW
HCT: 29 — ABNORMAL LOW
HCT: 30 — ABNORMAL LOW
HCT: 32.4 — ABNORMAL LOW
HCT: 32.6 — ABNORMAL LOW
Hemoglobin: 10.4 — ABNORMAL LOW
Hemoglobin: 10.5 — ABNORMAL LOW
Hemoglobin: 10.5 — ABNORMAL LOW
Hemoglobin: 9.4 — ABNORMAL LOW
Hemoglobin: 9.5 — ABNORMAL LOW
Hemoglobin: 9.5 — ABNORMAL LOW
MCHC: 32.1
MCHC: 32.2
MCHC: 32.4
MCHC: 32.4
MCHC: 32.6
MCHC: 32.6
MCHC: 33.1
MCV: 87
MCV: 87.3
MCV: 87.4
MCV: 87.5
MCV: 87.7
MCV: 88.1
MCV: 89
MCV: 89.2
MCV: 90.2
Platelets: 408 — ABNORMAL HIGH
Platelets: 470 — ABNORMAL HIGH
Platelets: 477 — ABNORMAL HIGH
RBC: 3.22 — ABNORMAL LOW
RBC: 3.35 — ABNORMAL LOW
RBC: 3.4 — ABNORMAL LOW
RBC: 3.41 — ABNORMAL LOW
RBC: 3.49 — ABNORMAL LOW
RBC: 3.62 — ABNORMAL LOW
RBC: 3.66 — ABNORMAL LOW
RDW: 17.4 — ABNORMAL HIGH
RDW: 17.4 — ABNORMAL HIGH
RDW: 17.7 — ABNORMAL HIGH
RDW: 17.8 — ABNORMAL HIGH
WBC: 15.4 — ABNORMAL HIGH
WBC: 16.5 — ABNORMAL HIGH
WBC: 18.7 — ABNORMAL HIGH
WBC: 19.1 — ABNORMAL HIGH
WBC: 19.2 — ABNORMAL HIGH
WBC: 20.3 — ABNORMAL HIGH

## 2011-09-28 LAB — URINALYSIS, ROUTINE W REFLEX MICROSCOPIC
Bilirubin Urine: NEGATIVE
Bilirubin Urine: NEGATIVE
Ketones, ur: NEGATIVE
Ketones, ur: NEGATIVE
Nitrite: NEGATIVE
Nitrite: NEGATIVE
Protein, ur: 30 — AB
Protein, ur: NEGATIVE
Urobilinogen, UA: 0.2
Urobilinogen, UA: 1
pH: 5

## 2011-09-28 LAB — COMPREHENSIVE METABOLIC PANEL
ALT: 43
AST: 23
BUN: 148 — ABNORMAL HIGH
BUN: 83 — ABNORMAL HIGH
CO2: 22
CO2: 24
Calcium: 9.6
Calcium: 9.6
Calcium: 9.8
Chloride: 101
Chloride: 99
Creatinine, Ser: 4.12 — ABNORMAL HIGH
GFR calc non Af Amer: 13 — ABNORMAL LOW
GFR calc non Af Amer: 15 — ABNORMAL LOW
Glucose, Bld: 181 — ABNORMAL HIGH
Glucose, Bld: 224 — ABNORMAL HIGH
Sodium: 137
Total Bilirubin: 1
Total Bilirubin: 1.3 — ABNORMAL HIGH
Total Protein: 7.9

## 2011-09-28 LAB — RENAL FUNCTION PANEL
Albumin: 2.3 — ABNORMAL LOW
Albumin: 2.5 — ABNORMAL LOW
BUN: 134 — ABNORMAL HIGH
BUN: 140 — ABNORMAL HIGH
BUN: 91 — ABNORMAL HIGH
CO2: 25
CO2: 26
Calcium: 9.6
Chloride: 99
Creatinine, Ser: 5.5 — ABNORMAL HIGH
Creatinine, Ser: 5.52 — ABNORMAL HIGH
GFR calc Af Amer: 13 — ABNORMAL LOW
Glucose, Bld: 205 — ABNORMAL HIGH
Glucose, Bld: 87
Phosphorus: 7.7 — ABNORMAL HIGH
Potassium: 4.3
Sodium: 135

## 2011-09-28 LAB — BASIC METABOLIC PANEL
BUN: 136 — ABNORMAL HIGH
BUN: 89 — ABNORMAL HIGH
CO2: 25
CO2: 28
Calcium: 9.5
Calcium: 9.6
Chloride: 104
Chloride: 88 — ABNORMAL LOW
Chloride: 92 — ABNORMAL LOW
Chloride: 92 — ABNORMAL LOW
Chloride: 93 — ABNORMAL LOW
Creatinine, Ser: 4.42 — ABNORMAL HIGH
Creatinine, Ser: 4.86 — ABNORMAL HIGH
Creatinine, Ser: 5.62 — ABNORMAL HIGH
Creatinine, Ser: 6.19 — ABNORMAL HIGH
GFR calc Af Amer: 11 — ABNORMAL LOW
GFR calc Af Amer: 12 — ABNORMAL LOW
GFR calc Af Amer: 15 — ABNORMAL LOW
GFR calc Af Amer: 16 — ABNORMAL LOW
GFR calc Af Amer: 18 — ABNORMAL LOW
GFR calc non Af Amer: 10 — ABNORMAL LOW
GFR calc non Af Amer: 15 — ABNORMAL LOW
Glucose, Bld: 74
Potassium: 3.7
Potassium: 4.2
Sodium: 133 — ABNORMAL LOW
Sodium: 135
Sodium: 141

## 2011-09-28 LAB — HEPARIN LEVEL (UNFRACTIONATED)
Heparin Unfractionated: 0.23 — ABNORMAL LOW
Heparin Unfractionated: 0.27 — ABNORMAL LOW
Heparin Unfractionated: 0.48
Heparin Unfractionated: 0.51

## 2011-09-28 LAB — CARDIAC PANEL(CRET KIN+CKTOT+MB+TROPI)
CK, MB: 2.7
Relative Index: INVALID
Total CK: 54

## 2011-09-28 LAB — PTH, INTACT AND CALCIUM
Calcium, Total (PTH): 7.6 — ABNORMAL LOW
Calcium, Total (PTH): 9.5
PTH: 111.5 — ABNORMAL HIGH

## 2011-09-28 LAB — IRON AND TIBC: Iron: 40 — ABNORMAL LOW

## 2011-09-28 LAB — DIFFERENTIAL
Basophils Absolute: 0.1
Basophils Relative: 0
Eosinophils Absolute: 0.2
Eosinophils Relative: 1
Eosinophils Relative: 1
Lymphocytes Relative: 6 — ABNORMAL LOW
Lymphs Abs: 1.1
Lymphs Abs: 1.2
Monocytes Relative: 7
Neutrophils Relative %: 86 — ABNORMAL HIGH

## 2011-09-28 LAB — CK TOTAL AND CKMB (NOT AT ARMC)
Relative Index: INVALID
Total CK: 29

## 2011-09-28 LAB — PROTIME-INR
INR: 1.2
INR: 1.4
INR: 1.7 — ABNORMAL HIGH
Prothrombin Time: 14.4
Prothrombin Time: 20.2 — ABNORMAL HIGH

## 2011-09-28 LAB — IRON: Iron: 22 — ABNORMAL LOW

## 2011-09-28 LAB — TSH: TSH: 1.303

## 2011-09-28 LAB — B-NATRIURETIC PEPTIDE (CONVERTED LAB)
Pro B Natriuretic peptide (BNP): 391 — ABNORMAL HIGH
Pro B Natriuretic peptide (BNP): 469 — ABNORMAL HIGH
Pro B Natriuretic peptide (BNP): 500 — ABNORMAL HIGH

## 2011-09-28 LAB — URINE MICROSCOPIC-ADD ON

## 2011-09-28 LAB — PHOSPHORUS: Phosphorus: 6.9 — ABNORMAL HIGH

## 2011-09-28 LAB — TROPONIN I
Troponin I: 0.04
Troponin I: 0.14 — ABNORMAL HIGH

## 2011-09-28 LAB — FERRITIN: Ferritin: 474 — ABNORMAL HIGH (ref 22–322)

## 2012-03-12 ENCOUNTER — Other Ambulatory Visit (HOSPITAL_COMMUNITY): Payer: Self-pay | Admitting: Nephrology

## 2012-03-12 ENCOUNTER — Ambulatory Visit (HOSPITAL_COMMUNITY)
Admission: RE | Admit: 2012-03-12 | Discharge: 2012-03-12 | Disposition: A | Discharge status: Home / Self Care | Payer: MEDICARE | Source: Ambulatory Visit | Attending: Nephrology | Admitting: Nephrology

## 2012-03-12 ENCOUNTER — Encounter (HOSPITAL_COMMUNITY): Payer: Self-pay

## 2012-03-12 DIAGNOSIS — E119 Type 2 diabetes mellitus without complications: Secondary | ICD-10-CM | POA: Insufficient documentation

## 2012-03-12 DIAGNOSIS — T82898A Other specified complication of vascular prosthetic devices, implants and grafts, initial encounter: Secondary | ICD-10-CM | POA: Insufficient documentation

## 2012-03-12 DIAGNOSIS — N186 End stage renal disease: Secondary | ICD-10-CM

## 2012-03-12 DIAGNOSIS — Y832 Surgical operation with anastomosis, bypass or graft as the cause of abnormal reaction of the patient, or of later complication, without mention of misadventure at the time of the procedure: Secondary | ICD-10-CM | POA: Insufficient documentation

## 2012-03-12 HISTORY — DX: Unspecified atrial flutter: I48.92

## 2012-03-12 HISTORY — DX: Anemia, unspecified: D64.9

## 2012-03-12 HISTORY — DX: Essential (primary) hypertension: I10

## 2012-03-12 HISTORY — DX: Obesity, unspecified: E66.9

## 2012-03-12 HISTORY — DX: Heart failure, unspecified: I50.9

## 2012-03-12 HISTORY — DX: Chronic obstructive pulmonary disease, unspecified: J44.9

## 2012-03-12 MED ORDER — HEPARIN SODIUM (PORCINE) 1000 UNIT/ML IJ SOLN
INTRAMUSCULAR | Status: AC
  Administered 2012-03-12: 3000 [IU]
  Filled 2012-03-12: qty 1

## 2012-03-12 MED ORDER — IOHEXOL 300 MG/ML  SOLN
100.0000 mL | Freq: Once | INTRAMUSCULAR | Status: AC | PRN
  Administered 2012-03-12: 60 mL via INTRAVENOUS

## 2012-03-12 MED ORDER — ALTEPLASE 100 MG IV SOLR
2.0000 mg | Freq: Once | INTRAVENOUS | Status: AC
  Administered 2012-03-12: 2 mg
  Filled 2012-03-12: qty 2

## 2012-03-12 NOTE — H&P (Signed)
[name] is an 71 y.o. male.   Chief Complaint: left arm graft clotted HPI: ESRD; DM Scheduled for left arm graft thrombolysis and possible angioplasty/stent placement. Possible dialysis catheter placement if necessary.  PMH: DM; ESRD  SH:  Rt hip replaced  No family history on file. Social History: nonsmoker; quit yrs ago  Allergies: sulfa; pcn; glucophage  No current outpatient prescriptions on file as of 03/12/2012.   Medications Prior to Admission  Medication Dose Route Frequency Provider Last Rate Last Dose  . alteplase (ACTIVASE) injection 2 mg  2 mg Intracatheter Once Dayne Oley Balm III, MD        No results found for this or any previous visit (from the past 48 hour(s)). No results found.  Review of Systems  Constitutional: Negative for fever.  Respiratory: Negative for cough.   Cardiovascular: Negative for chest pain.  Gastrointestinal: Negative for nausea and vomiting.    Blood pressure 152/73, pulse 102, temperature 98.3 F (36.8 C), resp. rate 18. Physical Exam  Constitutional: He is oriented to person, place, and time. He appears well-developed and well-nourished.  Cardiovascular: Normal rate, regular rhythm and normal heart sounds.   Respiratory: Effort normal and breath sounds normal. He has no wheezes.  GI: Soft. Bowel sounds are normal. There is no tenderness.  Musculoskeletal:       Uses walker and wc  Neurological: He is alert and oriented to person, place, and time.  Skin: Skin is warm and dry.     Assessment/Plan Left arm graft clotted. Scheduled today for thrombolysis and possible pta/stent placement. Possible dialysis cathter placement if necessary Pt aware of procedure benefits and risks and agreeable to proceed. Consent signed.  [name] 03/12/2012, 11:32 AM

## 2012-03-12 NOTE — Procedures (Signed)
Declot LFA graft 7mm PTA graft and venous stenoses No complication No blood loss. See complete dictation in [facility].

## 2012-03-12 NOTE — Discharge Instructions (Signed)
Arteriovenous (AV) Access for Hemodialysis An arteriovenous (AV) access is a surgically created or placed tube that allows for repeated access to the blood in your body. This access is required for hemodialysis, a type of dialysis. Dialysis is a treatment process that filters and cleans the blood in order to eliminate toxic wastes from the body when the kidneys fail to do this on their own. There are several different access methods used for hemodialysis. ACCESS METHODS  Double lumen catheter. A flexible tube with 2 channels may be used on a temporary or long-term basis. A long-term use catheter often has a cuff that holds it in place. The catheter is surgically placed and tunneled under the skin. This catheter is often placed when dialysis is needed in an emergency, such as when the kidneys suddenly stop working. It may also be needed when a permanent AV access fails or has not yet been placed. A catheter is usually placed into one of the following:   Large vein under your collarbone (subclavian vein).   Large vein in your neck (jugular vein).   Temporarily, in the large vein in your groin (femoral vein).   AV graft. A man-made (synthetic) material may be used to connect an artery and vein in the arm or thigh. This graft takes around 2 weeks to develop (mature) and is often placed a few weeks prior to use. A graft can generally last from 1 to 2 years.   AV fistula. A minor surgical procedure may be done to connect an artery and vein, creating a fistula. This causes arterial blood to flow directly into a vein. The vein gets larger, allowing easier access for dialysis. The fistula takes around 12 weeks to mature and must be placed several months before dialysis is anticipated. A fistula provides the best access for hemodialysis and can last for several years.  It is critical that veins in patients at high risk to develop kidney failure are preserved. This may include avoiding blood pressure checks and  intravenous (IV) or lab draws from the arm. This maximizes the chance for creating a functioning AV access when needed. Although a fistula is the most desirable access to use for hemodialysis, it may not be possible. If the veins are not large enough or there is no time to wait for a fistula to mature, a graft or catheter may be used. RISKS AND COMPLICATIONS   Double lumen catheter. A catheter may develop serious infections. It may also develop a clot (thrombosis) and fail. A catheter can cause clots in the vein in which it is placed. A subclavian vein catheter is the most likely to cause thrombosis. When the subclavian vein clots, it makes it very difficult for the patient to sustain an AV graft or fistula. When possible, catheters should be avoided and a more permanent AV access should be placed.   AV graft. A graft may swell after surgery, but this should decrease as it heals. A graft may stop working properly due to thrombosis or the diameter of the tube getting smaller. The tube can eventually become blocked (stenosis). If a partial blockage is found relatively early, it can be treated. Left untreated, the stenosis will progress until the vessel is completely blocked. Infection may also occur.   AV fistula. Infection and thrombosis are the biggest risks with a fistula. However, the rates for infection and stenosis are lower with fistulas than the other 2 methods. Frequent thrombosis may require creating a backup fistula at another site.   This will allow for dialysis when one access is blocked.  HOME CARE INSTRUCTIONS   Keep the site of the cut (incision) clean and dry while it heals. This helps prevent infection.   If you have a catheter, do not shower while the incision is healing. After it is healed, ask your caregiver for recommendations on showering. The bandage (dressing) will be changed at the dialysis center. Do not remove this dressing. However, if it becomes wet or loose, a sterile gauze  dressing may be placed over the site and secured by placing adhesive tape on the edges.   If you have a graft or fistula, clean the site daily until it heals completely. Stitches will be removed, usually after about 10 to 14 days. Once the access is being used for dialysis, a small dressing will be placed over the needle sites after the treatment is done. Keep this dressing on for at least 12 hours. Keep your arm or thigh clean and dry during this time.   A small amount of bleeding is normal, especially if the access is new. If you have a large amount of bleeding and cannot stop it, this is not normal. Call your caregiver right away. You will be taught how to hold your graft or fistula sites to stop the bleeding.  If you have a graft or fistula:  A "bruit" is a noise that is heard with a stethoscope and a "thrill" is a vibration felt over the graft or fistula. The presence of the bruit and thrill indicate that the access is working. You will be taught to feel for the thrill each day. If this is not felt, the access may be clotted. Call your caregiver.   You may use the arm freely after the site heals. Keep the following in mind:   Avoid pressure on the arm.   Avoid lifting heavy objects with the arm.   Avoid sleeping on the arm with the graft or fistula.   Avoid wearing tight-sleeved shirts or jewelry around the graft or fistula.   Do not allow blood pressure monitoring or needle punctures on the side where the graft or fistula is located.   With permission from your caregiver, you may do exercises to help with blood flow through a fistula. These exercises involve squeezing a rubber ball or other soft objects as instructed.  SEEK MEDICAL CARE IF:   Chills develop.   You have an oral temperature above 102 F (38.9 C).   Swelling around the graft or fistula gets worse.   New pain develops.   Unusual bleeding develops.   Pus or other fluid (drainage) is seen at the AV access site.    Skin redness or red streaking is seen on the skin around, above, or below the AV access.  SEEK IMMEDIATE MEDICAL CARE IF:   Pain, numbness, or an unusual pale skin color develops in the hand on the side of your fistula.   Dizziness or weakness develops that you have not had before.   Shortness of breath develops.   Chest pain develops.   The AV access has bleeding that cannot be easily controlled.  Wear a medical alert bracelet to let caregivers know you are a dialysis patient, so they can care for your veins appropriately. Document Released: 02/25/2003 Document Revised: 11/24/2011 Document Reviewed: 05/04/2010 [employer] Patient Information 2012 [employer]. 

## 2012-03-13 ENCOUNTER — Telehealth (HOSPITAL_COMMUNITY): Payer: Self-pay | Admitting: Radiology

## 2012-05-16 ENCOUNTER — Other Ambulatory Visit (HOSPITAL_COMMUNITY): Payer: Self-pay | Admitting: Nephrology

## 2012-05-16 DIAGNOSIS — N186 End stage renal disease: Secondary | ICD-10-CM

## 2012-05-17 ENCOUNTER — Encounter (HOSPITAL_COMMUNITY): Payer: Self-pay

## 2012-05-17 ENCOUNTER — Other Ambulatory Visit (HOSPITAL_COMMUNITY): Payer: Self-pay | Admitting: Nephrology

## 2012-05-17 ENCOUNTER — Ambulatory Visit (HOSPITAL_COMMUNITY)
Admission: RE | Admit: 2012-05-17 | Discharge: 2012-05-17 | Disposition: A | Discharge status: Home / Self Care | Payer: MEDICARE | Source: Ambulatory Visit | Attending: Nephrology | Admitting: Nephrology

## 2012-05-17 VITALS — BP 166/76 | HR 97 | Temp 98.1°F | Resp 14

## 2012-05-17 DIAGNOSIS — N186 End stage renal disease: Secondary | ICD-10-CM | POA: Insufficient documentation

## 2012-05-17 DIAGNOSIS — Y849 Medical procedure, unspecified as the cause of abnormal reaction of the patient, or of later complication, without mention of misadventure at the time of the procedure: Secondary | ICD-10-CM | POA: Insufficient documentation

## 2012-05-17 DIAGNOSIS — T82898A Other specified complication of vascular prosthetic devices, implants and grafts, initial encounter: Secondary | ICD-10-CM | POA: Insufficient documentation

## 2012-05-17 LAB — POCT I-STAT 4, (NA,K, GLUC, HGB,HCT)
Glucose, Bld: 139 mg/dL — ABNORMAL HIGH (ref 70–99)
HCT: 35 % — ABNORMAL LOW (ref 39.0–52.0)
Hemoglobin: 11.9 g/dL — ABNORMAL LOW (ref 13.0–17.0)
Potassium: 5.3 mEq/L — ABNORMAL HIGH (ref 3.5–5.1)

## 2012-05-17 LAB — GLUCOSE, CAPILLARY: Glucose-Capillary: 150 mg/dL — ABNORMAL HIGH (ref 70–99)

## 2012-05-17 MED ORDER — ALTEPLASE 100 MG IV SOLR
2.0000 mg | INTRAVENOUS | Status: DC
  Filled 2012-05-17: qty 2

## 2012-05-17 MED ORDER — HEPARIN SODIUM (PORCINE) 1000 UNIT/ML IJ SOLN
INTRAMUSCULAR | Status: AC | PRN
  Administered 2012-05-17: 3000 [IU]

## 2012-05-17 MED ORDER — ALTEPLASE 100 MG IV SOLR
INTRAVENOUS | Status: AC | PRN
  Administered 2012-05-17: 2 mg

## 2012-05-17 MED ORDER — HEPARIN SODIUM (PORCINE) 1000 UNIT/ML IJ SOLN
INTRAMUSCULAR | Status: AC
  Filled 2012-05-17: qty 1

## 2012-05-17 MED ORDER — IOHEXOL 300 MG/ML  SOLN
100.0000 mL | Freq: Once | INTRAMUSCULAR | Status: AC | PRN
  Administered 2012-05-17: 60 mL via INTRAVENOUS

## 2012-05-17 NOTE — Procedures (Signed)
Declot L graft, venous PTA No complication No blood loss. See complete dictation in [facility].

## 2012-05-17 NOTE — H&P (Signed)
[name] is an 71 y.o. male.  Chief Complaint: left arm graft clotted   HPI: ESRD; DM  Scheduled for left arm graft thrombolysis and possible angioplasty/stent placement. Possible dialysis catheter placement if necessary.   PMH: DM; ESRD   SH: Rt hip replaced   No family history on file.   Social History: nonsmoker; quit yrs ago   Allergies: sulfa; pcn; glucophage   Meds: Novolog, Renavite, Zyloprim, ASA    Review of Systems  Constitutional: Negative for fever.  Respiratory: Negative for cough.  Cardiovascular: Negative for chest pain.  Gastrointestinal: Negative for nausea and vomiting.   Physical Exam: BP 154/71  Pulse 80  Temp 98.1 F (36.7 C)  Resp 20  SpO2 100%  Constitutional: He is oriented to person, place, and time. He appears well-developed and well-nourished.  Cardiovascular: Normal rate, regular rhythm and normal heart sounds.  Respiratory: Effort normal and breath sounds normal. He has no wheezes.  GI: Soft. Bowel sounds are normal. There is no tenderness.  Musculoskeletal: (L)forearm AVG with no palpable pulse./ Uses walker and wc  Neurological: He is alert and oriented to person, place, and time.  Skin: Skin is warm and dry.   Assessment/Plan  Left arm graft clotted.  Scheduled today for thrombolysis and possible pta/stent placement.  Possible dialysis cathter placement if necessary  Pt aware of procedure benefits and risks and agreeable to proceed.  Consent signed.  Barbee Shropshire [facility] 05/17/2012 12:19 PM

## 2012-05-17 NOTE — ED Notes (Signed)
Pt requests no sedation for declot today.  Has been NPO  Glucose 150 at 1215.  No IV access.  To draw K after access by MD.

## 2012-05-18 ENCOUNTER — Telehealth (HOSPITAL_COMMUNITY): Payer: Self-pay | Admitting: *Deleted

## 2012-05-18 NOTE — Telephone Encounter (Signed)
Post procedure follow up call.  Spoke with pt's wife.  Says he's doing fine, no problems

## 2012-05-21 ENCOUNTER — Encounter (HOSPITAL_COMMUNITY): Payer: Self-pay | Admitting: Certified Registered Nurse Anesthetist

## 2012-05-21 ENCOUNTER — Ambulatory Visit (HOSPITAL_COMMUNITY): Payer: Medicare Other | Admitting: Certified Registered Nurse Anesthetist

## 2012-05-21 ENCOUNTER — Other Ambulatory Visit: Payer: Self-pay | Admitting: *Deleted

## 2012-05-21 ENCOUNTER — Encounter (HOSPITAL_COMMUNITY)
Admission: AD | Disposition: A | Discharge status: Home / Self Care | Payer: Self-pay | Source: Ambulatory Visit | Attending: Vascular Surgery

## 2012-05-21 ENCOUNTER — Ambulatory Visit (HOSPITAL_COMMUNITY)
Admission: AD | Admit: 2012-05-21 | Discharge: 2012-05-21 | Disposition: A | Discharge status: Home / Self Care | Payer: Medicare Other | Source: Ambulatory Visit | Attending: Vascular Surgery | Admitting: Vascular Surgery

## 2012-05-21 ENCOUNTER — Encounter (HOSPITAL_COMMUNITY): Payer: Self-pay | Admitting: *Deleted

## 2012-05-21 ENCOUNTER — Ambulatory Visit (HOSPITAL_COMMUNITY): Payer: Medicare Other

## 2012-05-21 DIAGNOSIS — I509 Heart failure, unspecified: Secondary | ICD-10-CM | POA: Insufficient documentation

## 2012-05-21 DIAGNOSIS — I4892 Unspecified atrial flutter: Secondary | ICD-10-CM | POA: Insufficient documentation

## 2012-05-21 DIAGNOSIS — J4489 Other specified chronic obstructive pulmonary disease: Secondary | ICD-10-CM | POA: Insufficient documentation

## 2012-05-21 DIAGNOSIS — T82898A Other specified complication of vascular prosthetic devices, implants and grafts, initial encounter: Secondary | ICD-10-CM

## 2012-05-21 DIAGNOSIS — N186 End stage renal disease: Secondary | ICD-10-CM

## 2012-05-21 DIAGNOSIS — J449 Chronic obstructive pulmonary disease, unspecified: Secondary | ICD-10-CM | POA: Insufficient documentation

## 2012-05-21 DIAGNOSIS — Y832 Surgical operation with anastomosis, bypass or graft as the cause of abnormal reaction of the patient, or of later complication, without mention of misadventure at the time of the procedure: Secondary | ICD-10-CM | POA: Insufficient documentation

## 2012-05-21 DIAGNOSIS — N189 Chronic kidney disease, unspecified: Secondary | ICD-10-CM | POA: Insufficient documentation

## 2012-05-21 DIAGNOSIS — M109 Gout, unspecified: Secondary | ICD-10-CM | POA: Insufficient documentation

## 2012-05-21 DIAGNOSIS — I129 Hypertensive chronic kidney disease with stage 1 through stage 4 chronic kidney disease, or unspecified chronic kidney disease: Secondary | ICD-10-CM | POA: Insufficient documentation

## 2012-05-21 DIAGNOSIS — E119 Type 2 diabetes mellitus without complications: Secondary | ICD-10-CM | POA: Insufficient documentation

## 2012-05-21 DIAGNOSIS — D649 Anemia, unspecified: Secondary | ICD-10-CM | POA: Insufficient documentation

## 2012-05-21 DIAGNOSIS — E669 Obesity, unspecified: Secondary | ICD-10-CM | POA: Insufficient documentation

## 2012-05-21 LAB — POCT I-STAT 4, (NA,K, GLUC, HGB,HCT)
Hemoglobin: 11.9 g/dL — ABNORMAL LOW (ref 13.0–17.0)
Sodium: 139 mEq/L (ref 135–145)

## 2012-05-21 LAB — GLUCOSE, CAPILLARY: Glucose-Capillary: 122 mg/dL — ABNORMAL HIGH (ref 70–99)

## 2012-05-21 LAB — PROTIME-INR: Prothrombin Time: 14 seconds (ref 11.6–15.2)

## 2012-05-21 SURGERY — THROMBECTOMY AND REVISION OF ARTERIOVENTOUS (AV) GORETEX  GRAFT
Anesthesia: Monitor Anesthesia Care | Site: Arm Lower | Laterality: Left | Wound class: Clean

## 2012-05-21 MED ORDER — SODIUM POLYSTYRENE SULFONATE 15 GM/60ML PO SUSP
15.0000 g | Freq: Once | ORAL | Status: DC
  Filled 2012-05-21: qty 60

## 2012-05-21 MED ORDER — HEPARIN SODIUM (PORCINE) 1000 UNIT/ML IJ SOLN
INTRAMUSCULAR | Status: DC | PRN
  Administered 2012-05-21: 5000 [IU] via INTRAVENOUS

## 2012-05-21 MED ORDER — FENTANYL CITRATE 0.05 MG/ML IJ SOLN
INTRAMUSCULAR | Status: DC | PRN
  Administered 2012-05-21 (×8): 25 ug via INTRAVENOUS

## 2012-05-21 MED ORDER — MUPIROCIN 2 % EX OINT
TOPICAL_OINTMENT | Freq: Two times a day (BID) | CUTANEOUS | Status: DC
  Administered 2012-05-21: 1 via NASAL
  Filled 2012-05-21: qty 22

## 2012-05-21 MED ORDER — MUPIROCIN 2 % EX OINT
TOPICAL_OINTMENT | CUTANEOUS | Status: AC
  Administered 2012-05-21: 1 via NASAL
  Filled 2012-05-21: qty 22

## 2012-05-21 MED ORDER — THROMBIN 20000 UNITS EX KIT
PACK | OROMUCOSAL | Status: DC | PRN
  Administered 2012-05-21: 14:00:00 via TOPICAL

## 2012-05-21 MED ORDER — 0.9 % SODIUM CHLORIDE (POUR BTL) OPTIME
TOPICAL | Status: DC | PRN
  Administered 2012-05-21: 1000 mL

## 2012-05-21 MED ORDER — HYDROMORPHONE HCL PF 1 MG/ML IJ SOLN: 0.2500 mg | INTRAMUSCULAR | Status: DC | PRN

## 2012-05-21 MED ORDER — SODIUM CHLORIDE 0.9 % IV SOLN: INTRAVENOUS | Status: DC

## 2012-05-21 MED ORDER — CARVEDILOL 3.125 MG PO TABS: 3.1250 mg | ORAL_TABLET | Freq: Once | ORAL | Status: DC

## 2012-05-21 MED ORDER — VANCOMYCIN HCL IN DEXTROSE 1-5 GM/200ML-% IV SOLN
1000.0000 mg | INTRAVENOUS | Status: AC
  Administered 2012-05-21: 1000 mg via INTRAVENOUS
  Filled 2012-05-21: qty 200

## 2012-05-21 MED ORDER — SODIUM CHLORIDE 0.9 % IV SOLN
INTRAVENOUS | Status: DC | PRN
  Administered 2012-05-21: 12:00:00 via INTRAVENOUS

## 2012-05-21 MED ORDER — HEPARIN SODIUM (PORCINE) 5000 UNIT/ML IJ SOLN
INTRAMUSCULAR | Status: DC | PRN
  Administered 2012-05-21: 12:00:00

## 2012-05-21 MED ORDER — MIDAZOLAM HCL 5 MG/5ML IJ SOLN
INTRAMUSCULAR | Status: DC | PRN
  Administered 2012-05-21 (×2): 1 mg via INTRAVENOUS

## 2012-05-21 MED ORDER — LIDOCAINE HCL (PF) 1 % IJ SOLN
INTRAMUSCULAR | Status: DC | PRN
  Administered 2012-05-21: 41 mL

## 2012-05-21 MED ORDER — ONDANSETRON HCL 4 MG/2ML IJ SOLN
INTRAMUSCULAR | Status: DC | PRN
  Administered 2012-05-21: 4 mg via INTRAVENOUS

## 2012-05-21 MED ORDER — PROTAMINE SULFATE 10 MG/ML IV SOLN
INTRAVENOUS | Status: DC | PRN
  Administered 2012-05-21: 50 mg via INTRAVENOUS

## 2012-05-21 MED ORDER — PROPOFOL 10 MG/ML IV EMUL
INTRAVENOUS | Status: DC | PRN
  Administered 2012-05-21: 50 ug/kg/min via INTRAVENOUS

## 2012-05-21 MED ORDER — CARVEDILOL 3.125 MG PO TABS
ORAL_TABLET | ORAL | Status: AC
  Administered 2012-05-21: 3.125 mg via ORAL
  Filled 2012-05-21: qty 1

## 2012-05-21 MED ORDER — OXYCODONE HCL 5 MG PO TABS: 5.0000 mg | ORAL_TABLET | Freq: Four times a day (QID) | ORAL | Status: AC | PRN

## 2012-05-21 SURGICAL SUPPLY — 44 items
ADH SKN CLS APL DERMABOND .7 (GAUZE/BANDAGES/DRESSINGS) ×1
CANISTER SUCTION 2500CC (MISCELLANEOUS) ×2 IMPLANT
CATH EMB 4FR 80CM (CATHETERS) ×5 IMPLANT
CATH EMB 5FR 80CM (CATHETERS) ×3 IMPLANT
CLIP TI MEDIUM 6 (CLIP) ×2 IMPLANT
CLIP TI WIDE RED SMALL 6 (CLIP) ×2 IMPLANT
CLOTH BEACON ORANGE TIMEOUT ST (SAFETY) ×2 IMPLANT
COVER SURGICAL LIGHT HANDLE (MISCELLANEOUS) ×4 IMPLANT
DECANTER SPIKE VIAL GLASS SM (MISCELLANEOUS) ×2 IMPLANT
DERMABOND ADVANCED (GAUZE/BANDAGES/DRESSINGS) ×1
DERMABOND ADVANCED .7 DNX12 (GAUZE/BANDAGES/DRESSINGS) ×1 IMPLANT
DRAPE X-RAY CASS 24X20 (DRAPES) IMPLANT
ELECT REM PT RETURN 9FT ADLT (ELECTROSURGICAL) ×2
ELECTRODE REM PT RTRN 9FT ADLT (ELECTROSURGICAL) ×1 IMPLANT
GAUZE SPONGE 2X2 8PLY STRL LF (GAUZE/BANDAGES/DRESSINGS) ×1 IMPLANT
GEL ULTRASOUND 20GR AQUASONIC (MISCELLANEOUS) IMPLANT
GLOVE BIO SURGEON STRL SZ 6.5 (GLOVE) ×2 IMPLANT
GLOVE BIO SURGEON STRL SZ7.5 (GLOVE) ×2 IMPLANT
GLOVE BIOGEL PI IND STRL 7.5 (GLOVE) IMPLANT
GLOVE BIOGEL PI INDICATOR 7.5 (GLOVE) ×1
GLOVE SURG SS PI 7.5 STRL IVOR (GLOVE) ×1 IMPLANT
GOWN PREVENTION PLUS XLARGE (GOWN DISPOSABLE) ×2 IMPLANT
GOWN STRL NON-REIN LRG LVL3 (GOWN DISPOSABLE) ×4 IMPLANT
GRAFT GORETEX 6X10 (Vascular Products) ×1 IMPLANT
KIT BASIN OR (CUSTOM PROCEDURE TRAY) ×2 IMPLANT
KIT ROOM TURNOVER OR (KITS) ×2 IMPLANT
LOOP VESSEL MINI RED (MISCELLANEOUS) IMPLANT
NS IRRIG 1000ML POUR BTL (IV SOLUTION) ×2 IMPLANT
PACK CV ACCESS (CUSTOM PROCEDURE TRAY) ×2 IMPLANT
PAD ARMBOARD 7.5X6 YLW CONV (MISCELLANEOUS) ×4 IMPLANT
SET COLLECT BLD 21X3/4 12 (NEEDLE) IMPLANT
SPONGE GAUZE 2X2 STER 10/PKG (GAUZE/BANDAGES/DRESSINGS) ×1
SPONGE SURGIFOAM ABS GEL 100 (HEMOSTASIS) ×1 IMPLANT
STOPCOCK 4 WAY LG BORE MALE ST (IV SETS) IMPLANT
SUT PROLENE 6 0 CC (SUTURE) ×4 IMPLANT
SUT VIC AB 3-0 SH 27 (SUTURE) ×4
SUT VIC AB 3-0 SH 27X BRD (SUTURE) ×1 IMPLANT
SUT VICRYL 4-0 PS2 18IN ABS (SUTURE) ×3 IMPLANT
SYR 3ML LL SCALE MARK (SYRINGE) ×1 IMPLANT
TOWEL OR 17X24 6PK STRL BLUE (TOWEL DISPOSABLE) ×2 IMPLANT
TOWEL OR 17X26 10 PK STRL BLUE (TOWEL DISPOSABLE) ×2 IMPLANT
TUBING EXTENTION W/L.L. (IV SETS) IMPLANT
UNDERPAD 30X30 INCONTINENT (UNDERPADS AND DIAPERS) ×2 IMPLANT
WATER STERILE IRR 1000ML POUR (IV SOLUTION) ×2 IMPLANT

## 2012-05-21 NOTE — Anesthesia Postprocedure Evaluation (Signed)
  Anesthesia Post-op Note  Patient: [name]  Procedure(s) Performed: Procedure(s) (LRB): THROMBECTOMY AND REVISION OF ARTERIOVENTOUS (AV) GORETEX  GRAFT (Left)  Patient Location: PACU  Anesthesia Type: General  Level of Consciousness: awake  Airway and Oxygen Therapy: Patient Spontanous Breathing  Post-op Pain: mild  Post-op Assessment: Post-op Vital signs reviewed  Post-op Vital Signs: Reviewed  Complications: No apparent anesthesia complications

## 2012-05-21 NOTE — Anesthesia Procedure Notes (Addendum)
Procedure Name: MAC Date/Time: 05/21/2012 11:40 AM Performed by: Margaree Mackintosh Pre-anesthesia Checklist: Patient identified, Timeout performed, Emergency Drugs available, Suction available and Patient being monitored Patient Re-evaluated:Patient Re-evaluated prior to inductionOxygen Delivery Method: Simple face mask Intubation Type: IV induction Placement Confirmation: positive ETCO2 Dental Injury: Teeth and Oropharynx as per pre-operative assessment

## 2012-05-21 NOTE — Discharge Instructions (Signed)
    05/21/2012 [name] [id number] 1941-11-18  Surgeon(s): Sherren Kerns, MD  Procedure(s): THROMBECTOMY AND REVISION OF ARTERIOVENTOUS (AV) GORETEX  GRAFT LEFT     x May stick graft on designated area only: DO NOT STICK OVER INCISIONS

## 2012-05-21 NOTE — Preoperative (Signed)
Beta Blockers  Took Coreg this am Reason not to administer Beta Blockers:Not Applicable

## 2012-05-21 NOTE — Progress Notes (Signed)
2 wounds with skin glue on the left forearm and upper arm ... Bruit and thrill present.Marland KitchenMarland Kitchen

## 2012-05-21 NOTE — Op Note (Addendum)
Procedure: Thrombectomy and revision of leftt forearm AV graft  Preoperative diagnosis: Thrombosed AV graft left arm  Postoperative diagnosis: Same  Anesthesia: Local with IV sedation  Assistant: Doreatha Massed PA-C  Operative findings: 4 mm outflow vein, revised with 6 mm interposition graft, some graft degeneration especially arterial limb  Operative details: After obtaining informed consent, the patient was taken to the operating room. The patient was placed in supine position on the operating room table. After adequate sedation, the patient's entire left upper extremity was prepped and draped in the usual sterile fashion. Local anesthesia was infiltrated at a pre-existing scar in the distal upper arm. A longitudinal incision was made in this location through a pre-existing scar. The incision was carried into the subcutaneous tissues down to level the graft. The graft was dissected free circumferentially. Dissection was carried down to the level of the venous anastomosis. There was obvious sclerosis of the outer wall of the vein in this location. This was dissected free circumferentially. The patient was given 5,000 units of intravenous heparin. A transverse graftotomy was made just above the level of the anastomosis. A #4 Fogarty catheter was used to thrombectomize the venous limb of the graft. There was some venous backbleeding but there were still intimal hyperplasia within the vein and the vein was quite thickened. Therefore the vein was opened longitudinally past the segment intimal hyperplasia. The thickened portion of vein was debrided away. The arterial limb the graft was then thrombectomized with a #4 Fogarty catheter. Multiple passes were made but I could not pass the catheter through the arterial anastomosis.  Therefore local anesthesia was infiltrated over a preexisting antecubital scar and a incision made and carried down to the level of the arterial limb of the graft which was on the  ulnar aspect of the forearm. A transfer graftotomy was made in this location and the graft thrombectomized.  I also passed 5 mm dilators through this limb of the graft to make sure there was no resistant pseudointima.  There was some graft degeneration especially in the arterial limb.  The arterial plug was removed and good arterial inflow achieved. The graft was clamped proximally and the graftotomy repaired with a running 6 0 Prolene.  Attention was then turned back to the upper arm.  The vein was controlled distally with a fine bulldog clamp. A new 6 mm interpostion graft was brought onto the operative field and beveled and sewn end to end to the vein using a running 6 0 Prolene. At completion of the anastomosis the venous limb was thoroughly flushed with heparinized saline and reoccluded. The graft was cut to length in preparation for sewing to the proximal graft. This was then sewn end-to-end to the proximal arterial limb of the graft using running 6-0 Prolene suture. Just prior to completion, the anastomosis was forebled backbled and thoroughly flushed. The anastomosis was secured; clamps were released; and there was a palpable thrill in the graft immediately. Hemostasis was obtained with direct pressure and the assistance of 50 mg of protamine. The subcutaneous tissues were reapproximated with running 3-0 Vicryl suture. The skin was closed with a 4 0 Vicryl subcuticular stitch. Dermabond was applied the incision. The patient tolerated the procedure well and there were no complications. Instrument sponge and needle counts were correct at the end of the case. The patient was taken to the recovery room in stable condition. The patient had a palpable radial pulse at the end of the case.  Fabienne Bruns, MD [facility] [facility] Office: [phone] Pager: [phone]

## 2012-05-21 NOTE — Transfer of Care (Signed)
Immediate Anesthesia Transfer of Care Note  Patient: [name]  Procedure(s) Performed: Procedure(s) (LRB): THROMBECTOMY AND REVISION OF ARTERIOVENTOUS (AV) GORETEX  GRAFT (Left)  Patient Location: PACU  Anesthesia Type: MAC  Level of Consciousness: awake, alert  and oriented  Airway & Oxygen Therapy: Patient Spontanous Breathing  Post-op Assessment: Report given to PACU RN and Post -op Vital signs reviewed and stable  Post vital signs: Reviewed and stable  Complications: No apparent anesthesia complications

## 2012-05-21 NOTE — Progress Notes (Signed)
Pt given bottle of  Kaexolate to take at home this pm due to elevated  Potassium .Marland KitchenMarland Kitchen

## 2012-05-21 NOTE — H&P (Signed)
  [facility] SHORT STAY H&P  CC:  Clotted left arm graft  HPI: Graft declotted in IR late last week now reoccluded  Past Medical History  Diagnosis Date  . Diabetes mellitus   . Chronic kidney disease   . Hypertension   . Atrial flutter   . Obesity   . COPD (chronic obstructive pulmonary disease)   . Gout   . CHF (congestive heart failure)   . Anemia     FH:  Non-Contributory  Social HX History  Substance Use Topics  . Smoking status: Former Games developer  . Smokeless tobacco: Not on file  . Alcohol Use: No    Allergies Allergies  Allergen Reactions  . Penicillins Rash  . Sulfa Antibiotics Rash  . Glucophage (Metformin Hydrochloride) Rash    Medications No current facility-administered medications on file prior to encounter.   Current Outpatient Prescriptions on File Prior to Encounter  Medication Sig Dispense Refill  . allopurinol (ZYLOPRIM) 300 MG tablet Take 150 mg by mouth daily.       Marland Kitchen aspirin 81 MG tablet Take 81 mg by mouth daily.      . carvedilol (COREG) 3.125 MG tablet Take 3.125 mg by mouth daily.      . fish oil-omega-3 fatty acids 1000 MG capsule Take 1 g by mouth daily.       . insulin aspart (NOVOLOG) 100 UNIT/ML injection Inject 5 Units into the skin 3 (three) times a week. Patient takes prior to dialysis and when glucose is high        LabsPHYSICAL EXAM  Filed Vitals:   05/21/12 1050  BP: 146/69  Pulse: 75  Temp:   Resp:     General:  WDWN in NAD HENT: WNL Eyes: Pupils equal Pulmonary: normal non-labored breathing , without Rales, rhonchi,  wheezing Cardiac: RRR, Vascular Exam/Pulses: 2+ left radial pulse, occluded left forearm graft, scars c/w revisision to proximal upper arm Neuro A&O x 3; good sensation; motion in all extremities  Impression: Clotted left forearm graft  Plan: Thrombectomy Revision left forearm graft today  [name] E @TODAY @ 11:19 AM

## 2012-05-21 NOTE — Anesthesia Preprocedure Evaluation (Signed)
Anesthesia Evaluation    Reviewed: Allergy & Precautions, H&P , NPO status   Airway Mallampati: II      Dental   Pulmonary COPD COPD inhaler,  breath sounds clear to auscultation        Cardiovascular hypertension, Pt. on medications +CHF + dysrhythmias Rate:Normal     Neuro/Psych    GI/Hepatic negative GI ROS, Neg liver ROS,   Endo/Other  Diabetes mellitus-, Type 2  Renal/GU negative Renal ROS     Musculoskeletal   Abdominal   Peds  Hematology negative hematology ROS (+)   Anesthesia Other Findings   Reproductive/Obstetrics                           Anesthesia Physical Anesthesia Plan  ASA: III  Anesthesia Plan: MAC   Post-op Pain Management:    Induction: Intravenous  Airway Management Planned:   Additional Equipment:   Intra-op Plan:   Post-operative Plan:   Informed Consent:   Plan Discussed with: CRNA  Anesthesia Plan Comments:         Anesthesia Quick Evaluation

## 2012-12-19 DIAGNOSIS — Z9289 Personal history of other medical treatment: Secondary | ICD-10-CM

## 2012-12-19 HISTORY — DX: Personal history of other medical treatment: Z92.89

## 2013-04-04 ENCOUNTER — Other Ambulatory Visit: Payer: Self-pay | Admitting: *Deleted

## 2013-04-05 ENCOUNTER — Encounter (HOSPITAL_COMMUNITY): Payer: Self-pay | Admitting: Pharmacy Technician

## 2013-04-09 ENCOUNTER — Ambulatory Visit (HOSPITAL_COMMUNITY)
Admission: RE | Admit: 2013-04-09 | Discharge: 2013-04-09 | Disposition: A | Discharge status: Home / Self Care | Payer: Medicare Other | Source: Ambulatory Visit | Attending: Surgery | Admitting: Surgery

## 2013-04-09 ENCOUNTER — Encounter (HOSPITAL_COMMUNITY)
Admission: RE | Disposition: A | Discharge status: Home / Self Care | Payer: Self-pay | Source: Ambulatory Visit | Attending: Surgery

## 2013-04-09 DIAGNOSIS — M109 Gout, unspecified: Secondary | ICD-10-CM | POA: Insufficient documentation

## 2013-04-09 DIAGNOSIS — Z79899 Other long term (current) drug therapy: Secondary | ICD-10-CM | POA: Insufficient documentation

## 2013-04-09 DIAGNOSIS — Z6830 Body mass index (BMI) 30.0-30.9, adult: Secondary | ICD-10-CM | POA: Insufficient documentation

## 2013-04-09 DIAGNOSIS — Z882 Allergy status to sulfonamides status: Secondary | ICD-10-CM | POA: Insufficient documentation

## 2013-04-09 DIAGNOSIS — D649 Anemia, unspecified: Secondary | ICD-10-CM | POA: Insufficient documentation

## 2013-04-09 DIAGNOSIS — I4892 Unspecified atrial flutter: Secondary | ICD-10-CM | POA: Insufficient documentation

## 2013-04-09 DIAGNOSIS — Z87891 Personal history of nicotine dependence: Secondary | ICD-10-CM | POA: Insufficient documentation

## 2013-04-09 DIAGNOSIS — J4489 Other specified chronic obstructive pulmonary disease: Secondary | ICD-10-CM | POA: Insufficient documentation

## 2013-04-09 DIAGNOSIS — I871 Compression of vein: Secondary | ICD-10-CM | POA: Insufficient documentation

## 2013-04-09 DIAGNOSIS — E669 Obesity, unspecified: Secondary | ICD-10-CM | POA: Insufficient documentation

## 2013-04-09 DIAGNOSIS — Z880 Allergy status to penicillin: Secondary | ICD-10-CM | POA: Insufficient documentation

## 2013-04-09 DIAGNOSIS — J449 Chronic obstructive pulmonary disease, unspecified: Secondary | ICD-10-CM | POA: Insufficient documentation

## 2013-04-09 DIAGNOSIS — Z888 Allergy status to other drugs, medicaments and biological substances status: Secondary | ICD-10-CM | POA: Insufficient documentation

## 2013-04-09 DIAGNOSIS — Y832 Surgical operation with anastomosis, bypass or graft as the cause of abnormal reaction of the patient, or of later complication, without mention of misadventure at the time of the procedure: Secondary | ICD-10-CM | POA: Insufficient documentation

## 2013-04-09 DIAGNOSIS — Y658 Other specified misadventures during surgical and medical care: Secondary | ICD-10-CM | POA: Insufficient documentation

## 2013-04-09 DIAGNOSIS — IMO0002 Reserved for concepts with insufficient information to code with codable children: Secondary | ICD-10-CM | POA: Insufficient documentation

## 2013-04-09 DIAGNOSIS — E119 Type 2 diabetes mellitus without complications: Secondary | ICD-10-CM | POA: Insufficient documentation

## 2013-04-09 DIAGNOSIS — I509 Heart failure, unspecified: Secondary | ICD-10-CM | POA: Insufficient documentation

## 2013-04-09 DIAGNOSIS — T82898A Other specified complication of vascular prosthetic devices, implants and grafts, initial encounter: Secondary | ICD-10-CM

## 2013-04-09 DIAGNOSIS — I120 Hypertensive chronic kidney disease with stage 5 chronic kidney disease or end stage renal disease: Secondary | ICD-10-CM | POA: Insufficient documentation

## 2013-04-09 DIAGNOSIS — N186 End stage renal disease: Secondary | ICD-10-CM | POA: Insufficient documentation

## 2013-04-09 DIAGNOSIS — I999 Unspecified disorder of circulatory system: Secondary | ICD-10-CM | POA: Insufficient documentation

## 2013-04-09 HISTORY — PX: SHUNTOGRAM: SHX5491

## 2013-04-09 LAB — GLUCOSE, CAPILLARY: Glucose-Capillary: 128 mg/dL — ABNORMAL HIGH (ref 70–99)

## 2013-04-09 LAB — POCT I-STAT, CHEM 8
BUN: 26 mg/dL — ABNORMAL HIGH (ref 6–23)
Calcium, Ion: 1.09 mmol/L — ABNORMAL LOW (ref 1.13–1.30)
Chloride: 101 mEq/L (ref 96–112)
Glucose, Bld: 167 mg/dL — ABNORMAL HIGH (ref 70–99)
Potassium: 4.8 mEq/L (ref 3.5–5.1)

## 2013-04-09 SURGERY — ASSESSMENT, SHUNT FUNCTION, WITH CONTRAST RADIOGRAPHIC STUDY

## 2013-04-09 MED ORDER — LIDOCAINE HCL (PF) 1 % IJ SOLN
INTRAMUSCULAR | Status: AC
  Filled 2013-04-09: qty 30

## 2013-04-09 MED ORDER — SODIUM CHLORIDE 0.9 % IJ SOLN: 3.0000 mL | INTRAMUSCULAR | Status: DC | PRN

## 2013-04-09 NOTE — Op Note (Signed)
[facility] [place]  Patient name: [name] MRN: [id number] DOB: 01-29-1941 Sex: male  04/09/2013 Pre-operative Diagnosis: Poorly Functioning left forearm graft Post-operative diagnosis:  Same Surgeon:  Jorge Ny Procedure Performed:  1.  ultrasound-guided access, left forearm dialysis graft  2.  shuntogram  3.  angioplasty, left brachial vein x2  4.  followup angiogram    Indications:  The patient had a left forearm graft placed in June of 2013. He is having trouble with his flow rates.  Procedure:  The patient was identified in the holding area and taken to room 8.  The patient was then placed supine on the table and prepped and draped in the usual sterile fashion.  A time out was called.  Ultrasound was used to evaluate the fistula.  The vein was patent and compressible.  A digital ultrasound image was acquired.  The fistula was then accessed under ultrasound guidance using a micropuncture needle.  An 018 wire was then asvanced without resistance and a micropuncture sheath was placed.  Contrast injections were then performed through the sheath.  Findings:  The central venous system is widely patent. Within the outflow vein near the antecubital crease, there are 2 areas of approximately 50% stenosis. The arterial anastomosis appears to be widely patent. On the medial half of the graft there is aneurysmal degeneration which is very mild. Along with this is slight narrowing within the graft itself.   Intervention:  After the above images were obtained, the decision was made to proceed with intervention. Over an 035 [place] wire, a 6 [place] sheath was placed. I did not administer heparin. I used a 7 x 40 Mustang balloon to perform angioplasty in 2 separate areas on the brachial vein near the antecubital crease. Each inflation was taken to 12 atmospheres and held for 30 seconds. Completion angiogram revealed a solution of the stenosis.  The patient has very  thin friable skin. He developed a small hematoma from subcutaneous infiltration of the lidocaine. I therefore elected not to place a second access which would've been required to treat the in graft narrowing. Rather I elected to terminate the procedure. The sheath and wire were removed and a suture was placed for hemostasis.  Impression:  #1  successful angioplasty of 2 focal separate stenosis within the venous outflow tract using a 7 x 40 balloon.  #2  no evidence of central venous stenosis  #3  mild in the graft narrowing near a degenerating portion on the medial side of the graft. This was not treated today as the patient had very friable skin and I did not want to make a second access. If he continues to have difficulties this would be an area that could be treated in the future. He remains a candidate for percutaneous intervention   V. Durene Cal, M.D. [facility] [place] Office: [phone] Pager:  [phone]

## 2013-04-09 NOTE — H&P (Signed)
  [facility] [place]   Patient name: [name] MRN: [id number] DOB: 1941-11-26 Sex: male    No chief complaint on file.   HISTORY OF PRESENT ILLNESS: 72 year old gentleman with end-stage renal disease. He has a left forearm Gore-Tex graft placed in June of 2013. He is having trouble with access flow rates. He comes in for fistulogram  Past Medical History  Diagnosis Date  . Diabetes mellitus   . Chronic kidney disease   . Hypertension   . Atrial flutter   . Obesity   . COPD (chronic obstructive pulmonary disease)   . Gout   . CHF (congestive heart failure)   . Anemia     Past Surgical History  Procedure Laterality Date  . Joint replacement      rt hip  . Arteriovenous graft placement  2008    left arm    History   Social History  . Marital Status: Married    Spouse Name: N/A    Number of Children: N/A  . Years of Education: N/A   Occupational History  . Not on file.   Social History Main Topics  . Smoking status: Former Games developer  . Smokeless tobacco: Not on file  . Alcohol Use: No  . Drug Use: No  . Sexually Active:    Other Topics Concern  . Not on file   Social History Narrative  . No narrative on file    No family history on file.  Allergies as of 04/04/2013 - Review Complete 05/21/2012  Allergen Reaction Noted  . Penicillins Rash 03/12/2012  . Sulfa antibiotics Rash 03/12/2012  . Glucophage (metformin hydrochloride) Rash 03/12/2012    No current facility-administered medications on file prior to encounter.   Current Outpatient Prescriptions on File Prior to Encounter  Medication Sig Dispense Refill  . allopurinol (ZYLOPRIM) 300 MG tablet Take 150 mg by mouth daily.       Marland Kitchen b complex-vitamin c-folic acid (NEPHRO-VITE) 0.8 MG TABS Take 0.8 mg by mouth daily.      . iron polysaccharides (NIFEREX 60) 40-20 MG capsule Take 1 capsule by mouth daily with breakfast.      . vitamin C (ASCORBIC ACID) 500 MG tablet Take 500  mg by mouth daily.         REVIEW OF SYSTEMS: On the  PHYSICAL EXAMINATION:   Vital signs are BP 108/66  Pulse 78  Temp(Src) 98.1 F (36.7 C) (Oral)  Resp 16  Ht 6' (1.829 m)  Wt 225 lb (102.059 kg)  BMI 30.51 kg/m2  SpO2 93% General: The patient appears their stated age. HEENT:  No gross abnormalities Pulmonary:  Non labored breathing  Musculoskeletal: There are no major deformities. Neurologic: No focal weakness or paresthesias are detected, Skin: There are no ulcer or rashes noted. Psychiatric: The patient has normal affect. Cardiovascular: There is a regular rate and rhythm without significant murmur appreciated. Palpable thrill within the forearm graft. No arm swelling   Diagnostic Studies None  Assessment: End-stage renal disease with poorly functioning left forearm graft Plan: I discussed proceeding with a shuntogram and intervention as indicated.  Jorge Ny, M.D. [facility] [place] Office: [phone] Pager:  [phone]

## 2013-04-10 LAB — GLUCOSE, CAPILLARY: Glucose-Capillary: 156 mg/dL — ABNORMAL HIGH (ref 70–99)

## 2013-05-16 ENCOUNTER — Other Ambulatory Visit (HOSPITAL_COMMUNITY): Payer: Self-pay | Admitting: Nephrology

## 2013-05-16 DIAGNOSIS — N186 End stage renal disease: Secondary | ICD-10-CM

## 2013-05-17 ENCOUNTER — Other Ambulatory Visit (HOSPITAL_COMMUNITY): Payer: Self-pay | Admitting: Nephrology

## 2013-05-17 ENCOUNTER — Ambulatory Visit (HOSPITAL_COMMUNITY)
Admission: RE | Admit: 2013-05-17 | Discharge: 2013-05-17 | Disposition: A | Discharge status: Home / Self Care | Payer: Medicare Other | Source: Ambulatory Visit | Attending: Nephrology | Admitting: Nephrology

## 2013-05-17 DIAGNOSIS — T82898A Other specified complication of vascular prosthetic devices, implants and grafts, initial encounter: Secondary | ICD-10-CM | POA: Insufficient documentation

## 2013-05-17 DIAGNOSIS — I509 Heart failure, unspecified: Secondary | ICD-10-CM | POA: Insufficient documentation

## 2013-05-17 DIAGNOSIS — Z880 Allergy status to penicillin: Secondary | ICD-10-CM | POA: Insufficient documentation

## 2013-05-17 DIAGNOSIS — D649 Anemia, unspecified: Secondary | ICD-10-CM | POA: Insufficient documentation

## 2013-05-17 DIAGNOSIS — Z888 Allergy status to other drugs, medicaments and biological substances status: Secondary | ICD-10-CM | POA: Insufficient documentation

## 2013-05-17 DIAGNOSIS — I82609 Acute embolism and thrombosis of unspecified veins of unspecified upper extremity: Secondary | ICD-10-CM | POA: Insufficient documentation

## 2013-05-17 DIAGNOSIS — E119 Type 2 diabetes mellitus without complications: Secondary | ICD-10-CM | POA: Insufficient documentation

## 2013-05-17 DIAGNOSIS — J449 Chronic obstructive pulmonary disease, unspecified: Secondary | ICD-10-CM | POA: Insufficient documentation

## 2013-05-17 DIAGNOSIS — I4891 Unspecified atrial fibrillation: Secondary | ICD-10-CM | POA: Insufficient documentation

## 2013-05-17 DIAGNOSIS — N186 End stage renal disease: Secondary | ICD-10-CM | POA: Insufficient documentation

## 2013-05-17 DIAGNOSIS — Y832 Surgical operation with anastomosis, bypass or graft as the cause of abnormal reaction of the patient, or of later complication, without mention of misadventure at the time of the procedure: Secondary | ICD-10-CM | POA: Insufficient documentation

## 2013-05-17 DIAGNOSIS — Z79899 Other long term (current) drug therapy: Secondary | ICD-10-CM | POA: Insufficient documentation

## 2013-05-17 DIAGNOSIS — E669 Obesity, unspecified: Secondary | ICD-10-CM | POA: Insufficient documentation

## 2013-05-17 DIAGNOSIS — J4489 Other specified chronic obstructive pulmonary disease: Secondary | ICD-10-CM | POA: Insufficient documentation

## 2013-05-17 DIAGNOSIS — I871 Compression of vein: Secondary | ICD-10-CM | POA: Insufficient documentation

## 2013-05-17 DIAGNOSIS — I771 Stricture of artery: Secondary | ICD-10-CM | POA: Insufficient documentation

## 2013-05-17 DIAGNOSIS — M109 Gout, unspecified: Secondary | ICD-10-CM | POA: Insufficient documentation

## 2013-05-17 DIAGNOSIS — I120 Hypertensive chronic kidney disease with stage 5 chronic kidney disease or end stage renal disease: Secondary | ICD-10-CM | POA: Insufficient documentation

## 2013-05-17 DIAGNOSIS — Z794 Long term (current) use of insulin: Secondary | ICD-10-CM | POA: Insufficient documentation

## 2013-05-17 DIAGNOSIS — Z882 Allergy status to sulfonamides status: Secondary | ICD-10-CM | POA: Insufficient documentation

## 2013-05-17 LAB — GLUCOSE, CAPILLARY: Glucose-Capillary: 67 mg/dL — ABNORMAL LOW (ref 70–99)

## 2013-05-17 LAB — POCT I-STAT 4, (NA,K, GLUC, HGB,HCT)
HCT: 26 % — ABNORMAL LOW (ref 39.0–52.0)
Hemoglobin: 8.8 g/dL — ABNORMAL LOW (ref 13.0–17.0)

## 2013-05-17 MED ORDER — ALTEPLASE 100 MG IV SOLR
2.0000 mg | Freq: Once | INTRAVENOUS | Status: AC
  Administered 2013-05-17: 2 mg
  Filled 2013-05-17: qty 2

## 2013-05-17 MED ORDER — GLUCOSE-VITAMIN C 4-6 GM-MG PO CHEW
CHEWABLE_TABLET | ORAL | Status: AC
  Administered 2013-05-17: 4 via ORAL
  Filled 2013-05-17: qty 3

## 2013-05-17 MED ORDER — IOHEXOL 300 MG/ML  SOLN
100.0000 mL | Freq: Once | INTRAMUSCULAR | Status: AC | PRN
  Administered 2013-05-17: 40 mL via INTRAVENOUS

## 2013-05-17 MED ORDER — HEPARIN SODIUM (PORCINE) 1000 UNIT/ML IJ SOLN
INTRAMUSCULAR | Status: DC | PRN
  Administered 2013-05-17: 3000 [IU] via INTRAVENOUS

## 2013-05-17 MED ORDER — GLUCOSE-VITAMIN C 4-6 GM-MG PO CHEW
CHEWABLE_TABLET | ORAL | Status: AC
  Filled 2013-05-17: qty 1

## 2013-05-17 MED ORDER — GLUCOSE-VITAMIN C 4-6 GM-MG PO CHEW: 4.0000 | CHEWABLE_TABLET | ORAL | Status: DC | PRN

## 2013-05-17 MED ORDER — HEPARIN SODIUM (PORCINE) 1000 UNIT/ML IJ SOLN
INTRAMUSCULAR | Status: AC
  Filled 2013-05-17: qty 1

## 2013-05-17 NOTE — Procedures (Signed)
Declot L FA graft Venous PTA No complication No blood loss. See complete dictation in [facility].

## 2013-05-17 NOTE — H&P (Signed)
[name] is an 72 y.o. male.   Chief Complaint: clotted left arm dialysis graft HPI: Patient with ESRD and thrombosed left arm AVG presents today for thrombolysis, possible angioplasty/stenting of graft or placement of new catheter if needed.  Past Medical History  Diagnosis Date  . Diabetes mellitus   . Chronic kidney disease   . Hypertension   . Atrial flutter   . Obesity   . COPD (chronic obstructive pulmonary disease)   . Gout   . CHF (congestive heart failure)   . Anemia     Past Surgical History  Procedure Laterality Date  . Joint replacement      rt hip  . Arteriovenous graft placement  2008    left arm    No family history on file. Social History:  reports that he has quit smoking. He does not have any smokeless tobacco history on file. He reports that he does not drink alcohol or use illicit drugs.  Allergies:  Allergies  Allergen Reactions  . Penicillins Rash  . Sulfa Antibiotics Rash  . Glucophage (Metformin Hydrochloride) Rash    Current outpatient prescriptions:allopurinol (ZYLOPRIM) 300 MG tablet, Take 150 mg by mouth daily. , Disp: , Rfl: ;  aspirin EC 81 MG tablet, Take 81 mg by mouth daily., Disp: , Rfl: ;  b complex-vitamin c-folic acid (NEPHRO-VITE) 0.8 MG TABS, Take 0.8 mg by mouth daily., Disp: , Rfl:  insulin aspart (NOVOLOG) 100 UNIT/ML injection, Inject 5 Units into the skin 3 (three) times a week. Takes prior to dialysis (Mondays, Wednesdays, and Fridays) when glucose is high., Disp: , Rfl: ;  iron polysaccharides (NIFEREX 60) 40-20 MG capsule, Take 1 capsule by mouth daily with breakfast., Disp: , Rfl: ;  omega-3 acid ethyl esters (LOVAZA) 1 G capsule, Take 1 g by mouth daily., Disp: , Rfl:  vitamin C (ASCORBIC ACID) 500 MG tablet, Take 500 mg by mouth daily., Disp: , Rfl:  Current facility-administered medications:glucose-Vitamin C 4-0.006 GM per chewable tablet 4 tablet, 4 tablet, Oral, PRN, Dayne Oley Balm III, MD, 4 tablet at 05/17/13  1023  CBG 58 05/17/13- pt given oral glucose tabs Review of Systems  Constitutional: Negative for fever and chills.  Respiratory: Negative for cough and shortness of breath.   Cardiovascular: Negative for chest pain.  Gastrointestinal: Negative for nausea, vomiting and abdominal pain.  Musculoskeletal: Negative for back pain.  Neurological: Negative for headaches.    Blood pressure 117/77, pulse 77, temperature 98.4 F (36.9 C), temperature source Oral, resp. rate 16. Physical Exam  Constitutional: He is oriented to person, place, and time. He appears well-developed and well-nourished.  Cardiovascular: Normal rate.   occ ectopy noted; left arm AVG with no thrill/ bruit and mild surrounding edema  Respiratory: Effort normal.  Distant BS throughout  GI: Bowel sounds are normal.  Musculoskeletal: Normal range of motion.  Neurological: He is alert and oriented to person, place, and time.     Assessment/Plan Pt with ESRD and thrombosed left arm AVG. Plan is for thrombolysis, possible angioplasty/stenting of graft or placement of new catheter if needed. Details of above d/w pt with his understanding and consent.  [name],D KEVIN 05/17/2013, 10:26 AM

## 2013-05-17 NOTE — ED Notes (Signed)
Pt not receiving sedation. K+ to be drawn

## 2013-05-28 ENCOUNTER — Other Ambulatory Visit (HOSPITAL_COMMUNITY): Payer: Self-pay | Admitting: Nephrology

## 2013-05-28 ENCOUNTER — Encounter (HOSPITAL_COMMUNITY): Payer: Self-pay | Admitting: Pharmacy Technician

## 2013-05-28 DIAGNOSIS — N186 End stage renal disease: Secondary | ICD-10-CM

## 2013-05-29 ENCOUNTER — Ambulatory Visit (HOSPITAL_COMMUNITY)
Admission: RE | Admit: 2013-05-29 | Discharge: 2013-05-29 | Disposition: A | Discharge status: Home / Self Care | Payer: Medicare Other | Source: Ambulatory Visit | Attending: Nephrology | Admitting: Nephrology

## 2013-05-29 ENCOUNTER — Encounter (HOSPITAL_COMMUNITY): Payer: Self-pay

## 2013-05-29 ENCOUNTER — Other Ambulatory Visit (HOSPITAL_COMMUNITY): Payer: Self-pay | Admitting: Nephrology

## 2013-05-29 VITALS — BP 134/70 | HR 88 | Temp 98.8°F | Resp 18

## 2013-05-29 DIAGNOSIS — I871 Compression of vein: Secondary | ICD-10-CM | POA: Insufficient documentation

## 2013-05-29 DIAGNOSIS — J449 Chronic obstructive pulmonary disease, unspecified: Secondary | ICD-10-CM | POA: Insufficient documentation

## 2013-05-29 DIAGNOSIS — N186 End stage renal disease: Secondary | ICD-10-CM

## 2013-05-29 DIAGNOSIS — I509 Heart failure, unspecified: Secondary | ICD-10-CM | POA: Insufficient documentation

## 2013-05-29 DIAGNOSIS — J4489 Other specified chronic obstructive pulmonary disease: Secondary | ICD-10-CM | POA: Insufficient documentation

## 2013-05-29 DIAGNOSIS — I82609 Acute embolism and thrombosis of unspecified veins of unspecified upper extremity: Secondary | ICD-10-CM | POA: Insufficient documentation

## 2013-05-29 DIAGNOSIS — T82898A Other specified complication of vascular prosthetic devices, implants and grafts, initial encounter: Secondary | ICD-10-CM | POA: Insufficient documentation

## 2013-05-29 DIAGNOSIS — Z888 Allergy status to other drugs, medicaments and biological substances status: Secondary | ICD-10-CM | POA: Insufficient documentation

## 2013-05-29 DIAGNOSIS — D649 Anemia, unspecified: Secondary | ICD-10-CM | POA: Insufficient documentation

## 2013-05-29 DIAGNOSIS — Z882 Allergy status to sulfonamides status: Secondary | ICD-10-CM | POA: Insufficient documentation

## 2013-05-29 DIAGNOSIS — M109 Gout, unspecified: Secondary | ICD-10-CM | POA: Insufficient documentation

## 2013-05-29 DIAGNOSIS — E669 Obesity, unspecified: Secondary | ICD-10-CM | POA: Insufficient documentation

## 2013-05-29 DIAGNOSIS — Z880 Allergy status to penicillin: Secondary | ICD-10-CM | POA: Insufficient documentation

## 2013-05-29 DIAGNOSIS — Y832 Surgical operation with anastomosis, bypass or graft as the cause of abnormal reaction of the patient, or of later complication, without mention of misadventure at the time of the procedure: Secondary | ICD-10-CM | POA: Insufficient documentation

## 2013-05-29 DIAGNOSIS — I120 Hypertensive chronic kidney disease with stage 5 chronic kidney disease or end stage renal disease: Secondary | ICD-10-CM | POA: Insufficient documentation

## 2013-05-29 DIAGNOSIS — Z87891 Personal history of nicotine dependence: Secondary | ICD-10-CM | POA: Insufficient documentation

## 2013-05-29 DIAGNOSIS — I4892 Unspecified atrial flutter: Secondary | ICD-10-CM | POA: Insufficient documentation

## 2013-05-29 DIAGNOSIS — E119 Type 2 diabetes mellitus without complications: Secondary | ICD-10-CM | POA: Insufficient documentation

## 2013-05-29 MED ORDER — ALTEPLASE 2 MG IJ SOLR
2.0000 mg | INTRAMUSCULAR | Status: AC
  Administered 2013-05-29: 2 mg
  Filled 2013-05-29: qty 2

## 2013-05-29 MED ORDER — HEPARIN SODIUM (PORCINE) 1000 UNIT/ML IJ SOLN
INTRAMUSCULAR | Status: AC
  Administered 2013-05-29: 3000 [IU]
  Filled 2013-05-29: qty 1

## 2013-05-29 MED ORDER — IOHEXOL 300 MG/ML  SOLN
100.0000 mL | Freq: Once | INTRAMUSCULAR | Status: AC | PRN
  Administered 2013-05-29: 50 mL via INTRAVENOUS

## 2013-05-29 NOTE — ED Notes (Signed)
Pt requests no sedation

## 2013-05-29 NOTE — Procedures (Signed)
Successful LUE AVG DECLOT W PTA NO COMP STABLE FULL REPORT IN PACS NEEDS NEW ACCESS WHEN REOCCLUDES BECAUSE OF GRAFT DETERIORATION ACCESS IS 72 YEARS OLD

## 2013-05-29 NOTE — H&P (Signed)
[name] is an 72 y.o. male.   Chief Complaint: left arm dialysis graft clotted Last use 6/9: successful Last intervention: 05/17/13 Pt scheduled for left arm dialysis graft thrombolysis and possible angioplasty/stent placement. Possible dialysis catheter placement HPI: ESRD; DM; HTN; a flutter; COPD; CHF  Past Medical History  Diagnosis Date  . Diabetes mellitus   . Chronic kidney disease   . Hypertension   . Atrial flutter   . Obesity   . COPD (chronic obstructive pulmonary disease)   . Gout   . CHF (congestive heart failure)   . Anemia     Past Surgical History  Procedure Laterality Date  . Joint replacement      rt hip  . Arteriovenous graft placement  2008    left arm    History reviewed. No pertinent family history. Social History:  reports that he has quit smoking. His smoking use included Cigarettes. He smoked 0.00 packs per day. He does not have any smokeless tobacco history on file. He reports that he does not drink alcohol or use illicit drugs.  Allergies:  Allergies  Allergen Reactions  . Penicillins Rash  . Sulfa Antibiotics Rash  . Glucophage (Metformin Hydrochloride) Rash     (Not in a hospital admission)  Results for orders placed during the hospital encounter of 05/29/13 (from the past 48 hour(s))  GLUCOSE, CAPILLARY     Status: Abnormal   Collection Time    05/29/13  7:09 AM      Result Value Range   Glucose-Capillary 163 (*) 70 - 99 mg/dL   No results found.  Review of Systems  Constitutional: Negative for fever.  Respiratory: Negative for shortness of breath.   Cardiovascular: Negative for chest pain.  Gastrointestinal: Negative for nausea, vomiting and abdominal pain.  Neurological: Positive for weakness. Negative for dizziness and headaches.    Blood pressure 123/65, pulse 80, temperature 98.8 F (37.1 C), temperature source Oral, resp. rate 18, SpO2 96.00%. Physical Exam  Constitutional: He is oriented to person, place, and  time. He appears well-developed.  Cardiovascular: Normal rate, regular rhythm and normal heart sounds.   No murmur heard. Respiratory: Effort normal. He has no wheezes.  GI: Soft. Bowel sounds are normal. There is no tenderness.  Musculoskeletal:  Uses walker; Rt forearm dialysis graft clotted  Neurological: He is alert and oriented to person, place, and time.  Psychiatric: He has a normal mood and affect. His behavior is normal. Judgment and thought content normal.     Assessment/Plan Left arm dialysis graft clotted Scheduled for thrombolysis and poss pta/stent. Poss catheter if needed Pt aware of procedure benefits and risks and agreeable to proceed Consent signed and in chart  Brigitt Mcclish A 05/29/2013, 7:40 AM

## 2013-05-30 ENCOUNTER — Telehealth (HOSPITAL_COMMUNITY): Payer: Self-pay | Admitting: *Deleted

## 2013-06-17 ENCOUNTER — Encounter: Payer: Self-pay | Admitting: Vascular Surgery

## 2013-06-18 ENCOUNTER — Ambulatory Visit (INDEPENDENT_AMBULATORY_CARE_PROVIDER_SITE_OTHER): Payer: Medicare Other | Admitting: Vascular Surgery

## 2013-06-18 ENCOUNTER — Encounter: Payer: Self-pay | Admitting: Vascular Surgery

## 2013-06-18 ENCOUNTER — Other Ambulatory Visit: Payer: Self-pay | Admitting: *Deleted

## 2013-06-18 ENCOUNTER — Encounter: Payer: Self-pay | Admitting: *Deleted

## 2013-06-18 ENCOUNTER — Encounter (HOSPITAL_COMMUNITY): Payer: Self-pay | Admitting: Pharmacy Technician

## 2013-06-18 VITALS — BP 133/70 | HR 92 | Ht 72.0 in | Wt 233.6 lb

## 2013-06-18 DIAGNOSIS — N186 End stage renal disease: Secondary | ICD-10-CM | POA: Insufficient documentation

## 2013-06-18 NOTE — Progress Notes (Signed)
The patient has today for evaluation of AV access for hemodialysis. He has a long history of a left forearm loop graft. This is had multiple surgical revisions is also had multiple thrombolyzes easily and angioplasties. His initial graft was placed in 2008 with Dr. Hart Rochester. Most recently underwent lysis and angioplasty in interventional radiology at [place] on 05/29/2013. I have reviewed these films. This does show degeneration and the graft itself and also significant narrowing in his venous outflow through the brachial vein with multiple prior angioplasties.  Past Medical History  Diagnosis Date  . Diabetes mellitus   . Chronic kidney disease   . Hypertension   . Atrial flutter   . Obesity   . COPD (chronic obstructive pulmonary disease)   . Gout   . CHF (congestive heart failure)   . Anemia     History  Substance Use Topics  . Smoking status: Former Smoker    Types: Cigarettes    Quit date: 12/19/1984  . Smokeless tobacco: Not on file  . Alcohol Use: No    Family History  Problem Relation Age of Onset  . Heart attack Mother   . Heart attack Father   . Diabetes Sister   . Diabetes Brother     Allergies  Allergen Reactions  . Penicillins Rash  . Sulfa Antibiotics Rash  . Glucophage (Metformin Hydrochloride) Rash    Current outpatient prescriptions:allopurinol (ZYLOPRIM) 300 MG tablet, Take 150 mg by mouth daily. , Disp: , Rfl: ;  aspirin EC 81 MG tablet, Take 81 mg by mouth daily., Disp: , Rfl: ;  b complex-vitamin c-folic acid (NEPHRO-VITE) 0.8 MG TABS, Take 0.8 mg by mouth daily., Disp: , Rfl: ;  fish oil-omega-3 fatty acids 1000 MG capsule, Take 1 g by mouth daily., Disp: , Rfl:  insulin aspart (NOVOLOG) 100 UNIT/ML injection, Inject 5 Units into the skin 3 (three) times a week. Takes prior to dialysis (Mondays, Wednesdays, and Fridays) when glucose is high., Disp: , Rfl: ;  Multiple Vitamin (MULTIVITAMIN WITH MINERALS) TABS, Take 1 tablet by mouth daily., Disp: ,  Rfl: ;  vitamin C (ASCORBIC ACID) 500 MG tablet, Take 500 mg by mouth daily., Disp: , Rfl:  iron polysaccharides (NIFEREX 60) 40-20 MG capsule, Take 1 capsule by mouth daily with breakfast., Disp: , Rfl:   BP 133/70  Pulse 92  Ht 6' (1.829 m)  Wt 233 lb 9.6 oz (105.96 kg)  BMI 31.67 kg/m2  SpO2 94%  Body mass index is 31.67 kg/(m^2).       Physical exam obese white male no acute distress Respirations nonlabored Neurologically grossly intact Walks with the aid of a walker Does have a left forearm loop graft with very good thrill and no evidence of skin breakdown over the graft. Accessible.  Impression and plan: Failing left forearm loop graft. The field every several weeks for the past several months. I discussed options with the patient. This is very C. end of its life span. I have recommended a left upper arm AV graft into the large axillary vein outflow. He does have easy palpable brachial pulse on the left. I explained this could be done electively with placement of a hemodialysis cath at the same time or he could continue use of the current loop graft in his forearm but then be subjected to a more urgent treatment less convenient time. The patient wishes to proceed with new graft placement. He dialyzes on Monday Wednesday Friday so therefore we will schedule this on  a Tuesday or Thursday. I explained in the office he's and he's not be available surgery he is comfortable with this. We will schedule him at his earliest

## 2013-06-25 ENCOUNTER — Other Ambulatory Visit: Payer: Self-pay

## 2013-07-01 ENCOUNTER — Encounter (HOSPITAL_COMMUNITY): Payer: Self-pay | Admitting: *Deleted

## 2013-07-01 MED ORDER — VANCOMYCIN HCL 10 G IV SOLR
1500.0000 mg | INTRAVENOUS | Status: AC
  Administered 2013-07-02: 1500 mg via INTRAVENOUS
  Filled 2013-07-01: qty 1500

## 2013-07-02 ENCOUNTER — Ambulatory Visit (HOSPITAL_COMMUNITY)
Admission: RE | Admit: 2013-07-02 | Discharge: 2013-07-02 | Disposition: A | Discharge status: Home / Self Care | Payer: Medicare Other | Source: Ambulatory Visit | Attending: Surgery | Admitting: Surgery

## 2013-07-02 ENCOUNTER — Encounter (HOSPITAL_COMMUNITY): Payer: Self-pay | Admitting: *Deleted

## 2013-07-02 ENCOUNTER — Ambulatory Visit (HOSPITAL_COMMUNITY): Payer: Medicare Other

## 2013-07-02 ENCOUNTER — Encounter (HOSPITAL_COMMUNITY): Payer: Self-pay | Admitting: Certified Registered"

## 2013-07-02 ENCOUNTER — Ambulatory Visit (HOSPITAL_COMMUNITY): Payer: Medicare Other | Admitting: Certified Registered"

## 2013-07-02 ENCOUNTER — Encounter (HOSPITAL_COMMUNITY)
Admission: RE | Disposition: A | Discharge status: Home / Self Care | Payer: Self-pay | Source: Ambulatory Visit | Attending: Surgery

## 2013-07-02 DIAGNOSIS — J449 Chronic obstructive pulmonary disease, unspecified: Secondary | ICD-10-CM | POA: Insufficient documentation

## 2013-07-02 DIAGNOSIS — J4489 Other specified chronic obstructive pulmonary disease: Secondary | ICD-10-CM | POA: Insufficient documentation

## 2013-07-02 DIAGNOSIS — I120 Hypertensive chronic kidney disease with stage 5 chronic kidney disease or end stage renal disease: Secondary | ICD-10-CM | POA: Insufficient documentation

## 2013-07-02 DIAGNOSIS — N186 End stage renal disease: Secondary | ICD-10-CM

## 2013-07-02 DIAGNOSIS — I4891 Unspecified atrial fibrillation: Secondary | ICD-10-CM | POA: Insufficient documentation

## 2013-07-02 DIAGNOSIS — Z992 Dependence on renal dialysis: Secondary | ICD-10-CM | POA: Insufficient documentation

## 2013-07-02 DIAGNOSIS — E669 Obesity, unspecified: Secondary | ICD-10-CM | POA: Insufficient documentation

## 2013-07-02 DIAGNOSIS — I509 Heart failure, unspecified: Secondary | ICD-10-CM | POA: Insufficient documentation

## 2013-07-02 DIAGNOSIS — E119 Type 2 diabetes mellitus without complications: Secondary | ICD-10-CM | POA: Insufficient documentation

## 2013-07-02 DIAGNOSIS — M109 Gout, unspecified: Secondary | ICD-10-CM | POA: Insufficient documentation

## 2013-07-02 DIAGNOSIS — Z87891 Personal history of nicotine dependence: Secondary | ICD-10-CM | POA: Insufficient documentation

## 2013-07-02 DIAGNOSIS — Z6831 Body mass index (BMI) 31.0-31.9, adult: Secondary | ICD-10-CM | POA: Insufficient documentation

## 2013-07-02 HISTORY — PX: AV FISTULA PLACEMENT: SHX1204

## 2013-07-02 HISTORY — PX: LIGATION ARTERIOVENOUS GORTEX GRAFT: SHX5947

## 2013-07-02 HISTORY — PX: INSERTION OF DIALYSIS CATHETER: SHX1324

## 2013-07-02 LAB — GLUCOSE, CAPILLARY

## 2013-07-02 LAB — POCT I-STAT 4, (NA,K, GLUC, HGB,HCT): Sodium: 139 mEq/L (ref 135–145)

## 2013-07-02 SURGERY — INSERTION OF ARTERIOVENOUS (AV) GORE-TEX GRAFT ARM
Anesthesia: General | Site: Arm Upper | Wound class: Clean

## 2013-07-02 MED ORDER — THROMBIN 20000 UNITS EX SOLR
CUTANEOUS | Status: AC
  Filled 2013-07-02: qty 20000

## 2013-07-02 MED ORDER — PROPOFOL 10 MG/ML IV BOLUS
INTRAVENOUS | Status: DC | PRN
  Administered 2013-07-02: 150 mg via INTRAVENOUS
  Administered 2013-07-02: 30 mg via INTRAVENOUS

## 2013-07-02 MED ORDER — OXYCODONE HCL 5 MG PO TABS: 5.0000 mg | ORAL_TABLET | ORAL | Status: DC | PRN

## 2013-07-02 MED ORDER — OXYCODONE HCL 5 MG/5ML PO SOLN: 5.0000 mg | Freq: Once | ORAL | Status: DC | PRN

## 2013-07-02 MED ORDER — FENTANYL CITRATE 0.05 MG/ML IJ SOLN
INTRAMUSCULAR | Status: DC | PRN
  Administered 2013-07-02: 50 ug via INTRAVENOUS
  Administered 2013-07-02: 100 ug via INTRAVENOUS
  Administered 2013-07-02: 50 ug via INTRAVENOUS

## 2013-07-02 MED ORDER — ONDANSETRON HCL 4 MG/2ML IJ SOLN: 4.0000 mg | Freq: Four times a day (QID) | INTRAMUSCULAR | Status: DC | PRN

## 2013-07-02 MED ORDER — LIDOCAINE-EPINEPHRINE (PF) 1 %-1:200000 IJ SOLN
INTRAMUSCULAR | Status: DC | PRN
  Administered 2013-07-02: 30 mL

## 2013-07-02 MED ORDER — SODIUM CHLORIDE 0.9 % IV SOLN
10.0000 mg | INTRAVENOUS | Status: DC | PRN
  Administered 2013-07-02: 10 ug/min via INTRAVENOUS

## 2013-07-02 MED ORDER — 0.9 % SODIUM CHLORIDE (POUR BTL) OPTIME
TOPICAL | Status: DC | PRN
  Administered 2013-07-02: 1000 mL

## 2013-07-02 MED ORDER — LIDOCAINE HCL (CARDIAC) 20 MG/ML IV SOLN
INTRAVENOUS | Status: DC | PRN
  Administered 2013-07-02: 80 mg via INTRAVENOUS

## 2013-07-02 MED ORDER — OXYCODONE-ACETAMINOPHEN 5-325 MG PO TABS: 1.0000 | ORAL_TABLET | ORAL | Status: DC | PRN

## 2013-07-02 MED ORDER — PHENYLEPHRINE HCL 10 MG/ML IJ SOLN
INTRAMUSCULAR | Status: DC | PRN
  Administered 2013-07-02 (×2): 120 ug via INTRAVENOUS
  Administered 2013-07-02: 160 ug via INTRAVENOUS

## 2013-07-02 MED ORDER — SODIUM CHLORIDE 0.9 % IV SOLN
INTRAVENOUS | Status: DC
  Administered 2013-07-02: 20 mL/h via INTRAVENOUS

## 2013-07-02 MED ORDER — FENTANYL CITRATE 0.05 MG/ML IJ SOLN: 25.0000 ug | INTRAMUSCULAR | Status: DC | PRN

## 2013-07-02 MED ORDER — OXYCODONE HCL 5 MG PO TABS: 5.0000 mg | ORAL_TABLET | Freq: Once | ORAL | Status: DC | PRN

## 2013-07-02 MED ORDER — LIDOCAINE-EPINEPHRINE (PF) 1 %-1:200000 IJ SOLN
INTRAMUSCULAR | Status: AC
  Filled 2013-07-02: qty 10

## 2013-07-02 MED ORDER — MIDAZOLAM HCL 5 MG/5ML IJ SOLN
INTRAMUSCULAR | Status: DC | PRN
  Administered 2013-07-02: 2 mg via INTRAVENOUS

## 2013-07-02 MED ORDER — HEPARIN SODIUM (PORCINE) 1000 UNIT/ML IJ SOLN
INTRAMUSCULAR | Status: DC | PRN
  Administered 2013-07-02: 1000 [IU]

## 2013-07-02 MED ORDER — SODIUM CHLORIDE 0.9 % IR SOLN
Status: DC | PRN
  Administered 2013-07-02: 11:00:00

## 2013-07-02 MED ORDER — LIDOCAINE HCL (PF) 1 % IJ SOLN
INTRAMUSCULAR | Status: AC
  Filled 2013-07-02: qty 30

## 2013-07-02 MED ORDER — HEPARIN SODIUM (PORCINE) 1000 UNIT/ML IJ SOLN
INTRAMUSCULAR | Status: AC
  Filled 2013-07-02: qty 1

## 2013-07-02 MED ORDER — ARTIFICIAL TEARS OP OINT
TOPICAL_OINTMENT | OPHTHALMIC | Status: DC | PRN
  Administered 2013-07-02: 1 via OPHTHALMIC

## 2013-07-02 MED ORDER — PROTAMINE SULFATE 10 MG/ML IV SOLN
INTRAVENOUS | Status: DC | PRN
  Administered 2013-07-02 (×3): 10 mg via INTRAVENOUS

## 2013-07-02 MED ORDER — HEPARIN SODIUM (PORCINE) 1000 UNIT/ML IJ SOLN
INTRAMUSCULAR | Status: DC | PRN
  Administered 2013-07-02: 7000 [IU] via INTRAVENOUS

## 2013-07-02 SURGICAL SUPPLY — 71 items
ADH SKN CLS APL DERMABOND .7 (GAUZE/BANDAGES/DRESSINGS) ×9
ARMBAND PINK RESTRICT EXTREMIT (MISCELLANEOUS) ×4 IMPLANT
BAG DECANTER FOR FLEXI CONT (MISCELLANEOUS) ×4 IMPLANT
CANISTER SUCTION 2500CC (MISCELLANEOUS) ×4 IMPLANT
CATH CANNON HEMO 15F 50CM (CATHETERS) IMPLANT
CATH CANNON HEMO 15FR 19 (HEMODIALYSIS SUPPLIES) IMPLANT
CATH CANNON HEMO 15FR 23CM (HEMODIALYSIS SUPPLIES) IMPLANT
CATH CANNON HEMO 15FR 31CM (HEMODIALYSIS SUPPLIES) IMPLANT
CATH CANNON HEMO 15FR 32 (HEMODIALYSIS SUPPLIES) IMPLANT
CATH CANNON HEMO 15FR 32CM (HEMODIALYSIS SUPPLIES) ×4 IMPLANT
CHLORAPREP W/TINT 26ML (MISCELLANEOUS) ×4 IMPLANT
CLIP TI MEDIUM 6 (CLIP) ×4 IMPLANT
CLIP TI WIDE RED SMALL 6 (CLIP) ×4 IMPLANT
CLOTH BEACON ORANGE TIMEOUT ST (SAFETY) ×4 IMPLANT
COVER PROBE W GEL 5X96 (DRAPES) ×1 IMPLANT
COVER SURGICAL LIGHT HANDLE (MISCELLANEOUS) ×5 IMPLANT
DECANTER SPIKE VIAL GLASS SM (MISCELLANEOUS) ×4 IMPLANT
DERMABOND ADVANCED (GAUZE/BANDAGES/DRESSINGS) ×3
DERMABOND ADVANCED .7 DNX12 (GAUZE/BANDAGES/DRESSINGS) ×3 IMPLANT
DEVICE TORQUE KENDALL .025-038 (MISCELLANEOUS) ×1 IMPLANT
DRAPE C-ARM 42X72 X-RAY (DRAPES) ×4 IMPLANT
DRAPE CHEST BREAST 15X10 FENES (DRAPES) ×4 IMPLANT
ELECT REM PT RETURN 9FT ADLT (ELECTROSURGICAL) ×4
ELECTRODE REM PT RTRN 9FT ADLT (ELECTROSURGICAL) ×3 IMPLANT
GAUZE SPONGE 2X2 8PLY STRL LF (GAUZE/BANDAGES/DRESSINGS) ×3 IMPLANT
GAUZE SPONGE 4X4 16PLY XRAY LF (GAUZE/BANDAGES/DRESSINGS) ×4 IMPLANT
GEL ULTRASOUND 20GR AQUASONIC (MISCELLANEOUS) IMPLANT
GLOVE BIO SURGEON STRL SZ7.5 (GLOVE) ×6 IMPLANT
GLOVE BIOGEL PI IND STRL 6.5 (GLOVE) IMPLANT
GLOVE BIOGEL PI IND STRL 7.0 (GLOVE) IMPLANT
GLOVE BIOGEL PI IND STRL 8 (GLOVE) ×3 IMPLANT
GLOVE BIOGEL PI INDICATOR 6.5 (GLOVE) ×1
GLOVE BIOGEL PI INDICATOR 7.0 (GLOVE) ×3
GLOVE BIOGEL PI INDICATOR 8 (GLOVE) ×2
GLOVE SS BIOGEL STRL SZ 6.5 (GLOVE) IMPLANT
GLOVE SUPERSENSE BIOGEL SZ 6.5 (GLOVE) ×1
GOWN STRL NON-REIN LRG LVL3 (GOWN DISPOSABLE) ×10 IMPLANT
GRAFT GORETEX STRT 4-7X45 (Vascular Products) ×1 IMPLANT
GUIDEWIRE ANGLED .035X150CM (WIRE) ×1 IMPLANT
KIT BASIN OR (CUSTOM PROCEDURE TRAY) ×4 IMPLANT
KIT ROOM TURNOVER OR (KITS) ×4 IMPLANT
NDL 18GX1X1/2 (RX/OR ONLY) (NEEDLE) ×3 IMPLANT
NDL HYPO 25GX1X1/2 BEV (NEEDLE) ×3 IMPLANT
NEEDLE 18GX1X1/2 (RX/OR ONLY) (NEEDLE) ×4 IMPLANT
NEEDLE 22X1 1/2 (OR ONLY) (NEEDLE) ×4 IMPLANT
NEEDLE HYPO 25GX1X1/2 BEV (NEEDLE) IMPLANT
NS IRRIG 1000ML POUR BTL (IV SOLUTION) ×4 IMPLANT
PACK CV ACCESS (CUSTOM PROCEDURE TRAY) ×4 IMPLANT
PACK SURGICAL SETUP 50X90 (CUSTOM PROCEDURE TRAY) ×4 IMPLANT
PAD ARMBOARD 7.5X6 YLW CONV (MISCELLANEOUS) ×8 IMPLANT
SPONGE GAUZE 2X2 STER 10/PKG (GAUZE/BANDAGES/DRESSINGS) ×1
SPONGE GAUZE 4X4 12PLY (GAUZE/BANDAGES/DRESSINGS) ×3 IMPLANT
SPONGE SURGIFOAM ABS GEL 100 (HEMOSTASIS) IMPLANT
SUT ETHILON 3 0 PS 1 (SUTURE) ×4 IMPLANT
SUT PROLENE 6 0 BV (SUTURE) ×8 IMPLANT
SUT SILK 0 TIES 10X30 (SUTURE) ×4 IMPLANT
SUT VIC AB 3-0 SH 27 (SUTURE) ×8
SUT VIC AB 3-0 SH 27X BRD (SUTURE) ×6 IMPLANT
SUT VICRYL 4-0 PS2 18IN ABS (SUTURE) ×10 IMPLANT
SWAB COLLECTION DEVICE MRSA (MISCELLANEOUS) IMPLANT
SYR 20CC LL (SYRINGE) ×7 IMPLANT
SYR 30ML LL (SYRINGE) IMPLANT
SYR 5ML LL (SYRINGE) ×8 IMPLANT
SYR CONTROL 10ML LL (SYRINGE) ×3 IMPLANT
SYRINGE 10CC LL (SYRINGE) ×4 IMPLANT
TAPE CLOTH SURG 4X10 WHT LF (GAUZE/BANDAGES/DRESSINGS) ×1 IMPLANT
TOWEL OR 17X24 6PK STRL BLUE (TOWEL DISPOSABLE) ×5 IMPLANT
TOWEL OR 17X26 10 PK STRL BLUE (TOWEL DISPOSABLE) ×4 IMPLANT
TUBE ANAEROBIC SPECIMEN COL (MISCELLANEOUS) IMPLANT
UNDERPAD 30X30 INCONTINENT (UNDERPADS AND DIAPERS) ×4 IMPLANT
WATER STERILE IRR 1000ML POUR (IV SOLUTION) ×4 IMPLANT

## 2013-07-02 NOTE — Anesthesia Postprocedure Evaluation (Signed)
Anesthesia Post Note  Patient: [name]  Procedure(s) Performed: Procedure(s) (LRB): INSERTION OF ARTERIOVENOUS (AV) GORE-TEX GRAFT ARM (Left) LIGATION ARTERIOVENOUS GORTEX GRAFT (Left) INSERTION OF DIALYSIS CATHETER (N/A)  Anesthesia type: General  Patient location: PACU  Post pain: Pain level controlled and Adequate analgesia  Post assessment: Post-op Vital signs reviewed, Patient's Cardiovascular Status Stable, Respiratory Function Stable, Patent Airway and Pain level controlled  Last Vitals:  Filed Vitals:   07/02/13 1215  BP: 125/46  Pulse: 98  Temp:   Resp: 22    Post vital signs: Reviewed and stable  Level of consciousness: awake, alert  and oriented  Complications: No apparent anesthesia complications

## 2013-07-02 NOTE — Transfer of Care (Signed)
Immediate Anesthesia Transfer of Care Note  Patient: [name]  Procedure(s) Performed: Procedure(s) with comments: INSERTION OF ARTERIOVENOUS (AV) GORE-TEX GRAFT ARM (Left) LIGATION ARTERIOVENOUS GORTEX GRAFT (Left) INSERTION OF DIALYSIS CATHETER (N/A) - Left Internal Jugular Placement  Patient Location: PACU  Anesthesia Type:General  Level of Consciousness: awake, alert  and oriented  Airway & Oxygen Therapy: Patient Spontanous Breathing and Patient connected to face mask oxygen  Post-op Assessment: Report given to PACU RN  Post vital signs: Reviewed and stable  Complications: No apparent anesthesia complications

## 2013-07-02 NOTE — OR Nursing (Addendum)
Dialysis Catheter Placement end @1020 

## 2013-07-02 NOTE — Interval H&P Note (Signed)
History and Physical Interval Note:  07/02/2013 9:05 AM  [name]  has presented today for surgery, with the diagnosis of CLOTTED AVG;ESRD  The various methods of treatment have been discussed with the patient and family. After consideration of risks, benefits and other options for treatment, the patient has consented to  Procedure(s): INSERTION OF ARTERIOVENOUS (AV) GORE-TEX GRAFT ARM (Left) LIGATION ARTERIOVENOUS GORTEX GRAFT (Left) INSERTION OF DIALYSIS CATHETER (N/A) as a surgical intervention .  The patient's history has been reviewed, patient examined, no change in status, stable for surgery.  I have reviewed the patient's chart and labs.  Questions were answered to the patient's satisfaction.     Sharisse Rantz S

## 2013-07-02 NOTE — Preoperative (Signed)
Beta Blockers   Reason not to administer Beta Blockers:Not Applicable 

## 2013-07-02 NOTE — Op Note (Signed)
NAME: [name]   MRN: [id number] DOB: [birth year]    DATE OF OPERATION: 07/02/2013  PREOP DIAGNOSIS: chronic kidney disease  POSTOP DIAGNOSIS: same  PROCEDURE:  1. Ultrasound-guided placement of left IJ Diatek catheter 2. Ligation of left forearm AV graft 3. Placement of new left upper arm AV graft (4-7 mm tapered PTFE graft)  SURGEON: Di Kindle. Edilia Bo, MD, FACS  ASSIST: Della Goo  ANESTHESIA: Gen.   EBL: minimal  INDICATIONS: [name] is a 72 y.o. 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 He was evaluated in the office by Dr. Arbie Cookey and it was recommended that a new graft in place in the upper arm, but the left forearm graft he ligated, and the catheter be placed.  FINDINGS: widely patent left axillary vein. The right IJ appeared to be occluded.  TECHNIQUE: The patient was taken to the operating room and received a general anesthetic. The neck and upper chest were prepped and draped in the usual sterile fashion. The right IJ appeared to be occluded. Under ultrasound guidance, the left IJ was cannulated and a guidewire was introduced into the right atrium. I had to use an angled Glidewire to manipulate the wire into the right atrium. Tract of the wire was dilated and then the dilator and peel-away sheath were passed over the wire. The 27 cm catheter was threaded over the wire through the peel-away sheath and down into the right atrium. The wire and peel-away sheath were removed. The cath was positioned in the right atrium. The exit site the catheter was selected and the skin anesthetized between the 2 areas. Was brought to the tunnel, cut to the appropriate length and the distal ports were attached to both ports withdrew easily when flushed with heparin saline and filled with concentrated heparin. The cath was secured at its exit site with a 3-0 nylon suture. The IJ cannulation site was closed with a 4-0 subcuticular stitch. Her bone was  applied.  Attention was turned to the left arm. The left upper extremity was prepped and draped in the usual sterile fashion. A transverse incision was made over the old graft which was then dissected free and ligated with 2 2-0 silk ties. A separate incision was made over the brachial artery just above the antecubital level. The brachial artery was dissected free. It was somewhat aneurysmal. This was from chronically having a left forearm AV graft. Separate longitudinal incision was made beneath the axilla. High x-ray vein was dissected free. Tunnel was created between the 2 incisions. A 4-7 mm tapered PTFE graft was tunneled between the 2 incisions and the patient was heparinized. Brachial artery was clamped proximally and distally and longitudinal arteriotomy was made. A segment of the 4 mm the graft was excised the graft spatulated and sewn end-to-side to the brachial artery using continuous 6-0 Prolene suture. Graft and pulled to the appropriate length for anastomosis to the high brachial vein. The vein was ligated distally. The graft was cut to the appropriate length and sewn end-to-end to the vein using continuous 6-0 Prolene suture. At completion was an excellent thrill in the graft. It was a radial and ulnar signals with Doppler. The heparin was partially reversed with protamine. The wounds were closed with a deep layer of 3-0 Vicryl and the skin closed with 4-0 Vicryl. Sterile dressings were applied. The patient tolerated the procedure well transferred to the recovery room in stable condition.  Waverly Ferrari, MD, FACS [facility]  DATE OF DICTATION:   07/02/2013

## 2013-07-02 NOTE — H&P (View-Only) (Signed)
The patient has today for evaluation of AV access for hemodialysis. He has a long history of a left forearm loop graft. This is had multiple surgical revisions is also had multiple thrombolyzes easily and angioplasties. His initial graft was placed in 2008 with Dr. [name]. Most recently underwent lysis and angioplasty in interventional radiology at [facility] on 05/29/2013. I have reviewed these films. This does show degeneration and the graft itself and also significant narrowing in his venous outflow through the brachial vein with multiple prior angioplasties.  Past Medical History  Diagnosis Date  . Diabetes mellitus   . Chronic kidney disease   . Hypertension   . Atrial flutter   . Obesity   . COPD (chronic obstructive pulmonary disease)   . Gout   . CHF (congestive heart failure)   . Anemia     History  Substance Use Topics  . Smoking status: Former Smoker    Types: Cigarettes    Quit date: 12/19/1984  . Smokeless tobacco: Not on file  . Alcohol Use: No    Family History  Problem Relation Age of Onset  . Heart attack Mother   . Heart attack Father   . Diabetes Sister   . Diabetes Brother     Allergies  Allergen Reactions  . Penicillins Rash  . Sulfa Antibiotics Rash  . Glucophage (Metformin Hydrochloride) Rash    Current outpatient prescriptions:allopurinol (ZYLOPRIM) 300 MG tablet, Take 150 mg by mouth daily. , Disp: , Rfl: ;  aspirin EC 81 MG tablet, Take 81 mg by mouth daily., Disp: , Rfl: ;  b complex-vitamin c-folic acid (NEPHRO-VITE) 0.8 MG TABS, Take 0.8 mg by mouth daily., Disp: , Rfl: ;  fish oil-omega-3 fatty acids 1000 MG capsule, Take 1 g by mouth daily., Disp: , Rfl:  insulin aspart (NOVOLOG) 100 UNIT/ML injection, Inject 5 Units into the skin 3 (three) times a week. Takes prior to dialysis (Mondays, Wednesdays, and Fridays) when glucose is high., Disp: , Rfl: ;  Multiple Vitamin (MULTIVITAMIN WITH MINERALS) TABS, Take 1 tablet by mouth daily., Disp: ,  Rfl: ;  vitamin C (ASCORBIC ACID) 500 MG tablet, Take 500 mg by mouth daily., Disp: , Rfl:  iron polysaccharides (NIFEREX 60) 40-20 MG capsule, Take 1 capsule by mouth daily with breakfast., Disp: , Rfl:   BP 133/70  Pulse 92  Ht 6' (1.829 m)  Wt 233 lb 9.6 oz (105.96 kg)  BMI 31.67 kg/m2  SpO2 94%  Body mass index is 31.67 kg/(m^2).       Physical exam obese white male no acute distress Respirations nonlabored Neurologically grossly intact Walks with the aid of a walker Does have a left forearm loop graft with very good thrill and no evidence of skin breakdown over the graft. Accessible.  Impression and plan: Failing left forearm loop graft. The field every several weeks for the past several months. I discussed options with the patient. This is very C. end of its life span. I have recommended a left upper arm AV graft into the large axillary vein outflow. He does have easy palpable brachial pulse on the left. I explained this could be done electively with placement of a hemodialysis cath at the same time or he could continue use of the current loop graft in his forearm but then be subjected to a more urgent treatment less convenient time. The patient wishes to proceed with new graft placement. He dialyzes on Monday Wednesday Friday so therefore we will schedule this on   a Tuesday or Thursday. I explained in the office he's and he's not be available surgery he is comfortable with this. We will schedule him at his earliest  

## 2013-07-02 NOTE — Anesthesia Procedure Notes (Addendum)
Procedure Name: LMA Insertion Date/Time: 07/02/2013 9:39 AM Performed by: Jefm Miles E Pre-anesthesia Checklist: Patient identified, Emergency Drugs available, Patient being monitored, Suction available and Timeout performed Patient Re-evaluated:Patient Re-evaluated prior to inductionOxygen Delivery Method: Circle system utilized Preoxygenation: Pre-oxygenation with 100% oxygen Intubation Type: IV induction Ventilation: Mask ventilation without difficulty LMA: LMA inserted LMA Size: 5.0 Number of attempts: 1 Placement Confirmation: positive ETCO2 and breath sounds checked- equal and bilateral Tube secured with: Tape Dental Injury: Teeth and Oropharynx as per pre-operative assessment

## 2013-07-02 NOTE — Anesthesia Preprocedure Evaluation (Addendum)
Anesthesia Evaluation  Patient identified by MRN, date of birth, ID band Patient awake    Reviewed: Allergy & Precautions, H&P , NPO status , Patient's Chart, lab work & pertinent test results  Airway Mallampati: II  Neck ROM: full    Dental  (+) Teeth Intact and Dental Advisory Given   Pulmonary COPDformer smoker,          Cardiovascular hypertension, +CHF + dysrhythmias Atrial Fibrillation     Neuro/Psych    GI/Hepatic   Endo/Other  diabetes, Type 2  Renal/GU ESRF and DialysisRenal disease     Musculoskeletal   Abdominal   Peds  Hematology   Anesthesia Other Findings   Reproductive/Obstetrics                          Anesthesia Physical Anesthesia Plan  ASA: III  Anesthesia Plan: General   Post-op Pain Management:    Induction: Intravenous  Airway Management Planned: LMA  Additional Equipment:   Intra-op Plan:   Post-operative Plan:   Informed Consent: I have reviewed the patients History and Physical, chart, labs and discussed the procedure including the risks, benefits and alternatives for the proposed anesthesia with the patient or authorized representative who has indicated his/her understanding and acceptance.     Plan Discussed with: CRNA, Anesthesiologist and Surgeon  Anesthesia Plan Comments:         Anesthesia Quick Evaluation

## 2013-07-04 ENCOUNTER — Encounter (HOSPITAL_COMMUNITY): Payer: Self-pay | Admitting: Vascular Surgery

## 2013-07-29 IMAGING — XA IR DECLOT *L*
1 series · 12 of 24 positions shown · non-contrast
Comparison: 05/17/2012.  The patient also had 7 mm venous PTA
performed 1 month ago by vascular surgery.

CLINICAL DATA: Occluded dialysis graft

DIALYSIS GRAFT DECLOT
VENOUS ANGIOPLASTY
ULTRASOUND GUIDANCE FOR VASCULAR ACCESS X2
TECHNIQUE: The procedure, risks (including but not limited to
bleeding, infection, organ damage), benefits, and alternatives were
explained to the patient.  Questions regarding the procedure were
encouraged and answered.  The patient understands and consents to
the procedure.

[Series 1: run · 12 of 41 slices shown]
[im 2/41]
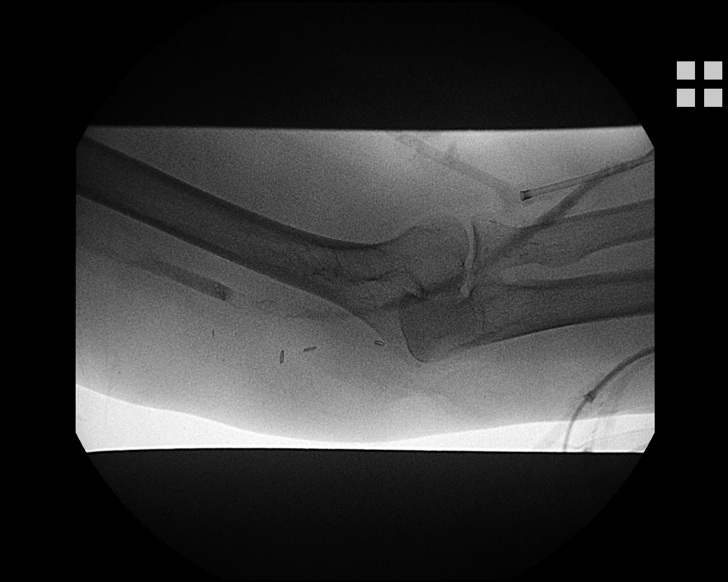
[im 6/41]
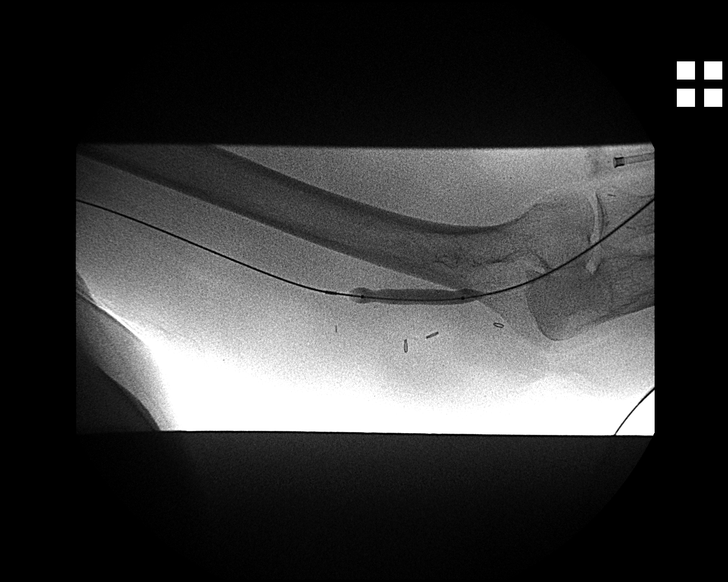
[im 9/41]
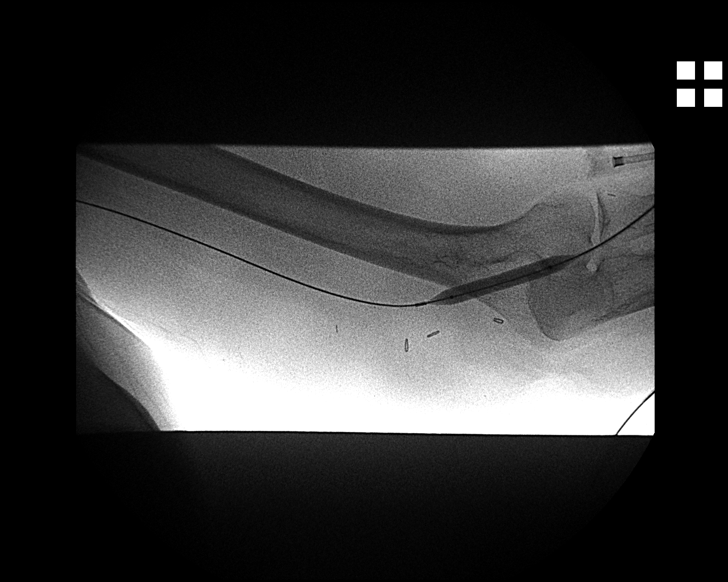
[im 13/41]
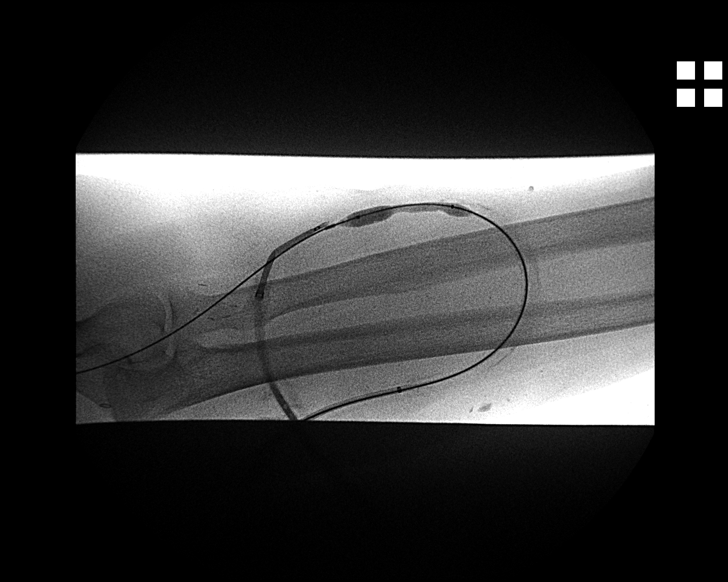
[im 16/41]
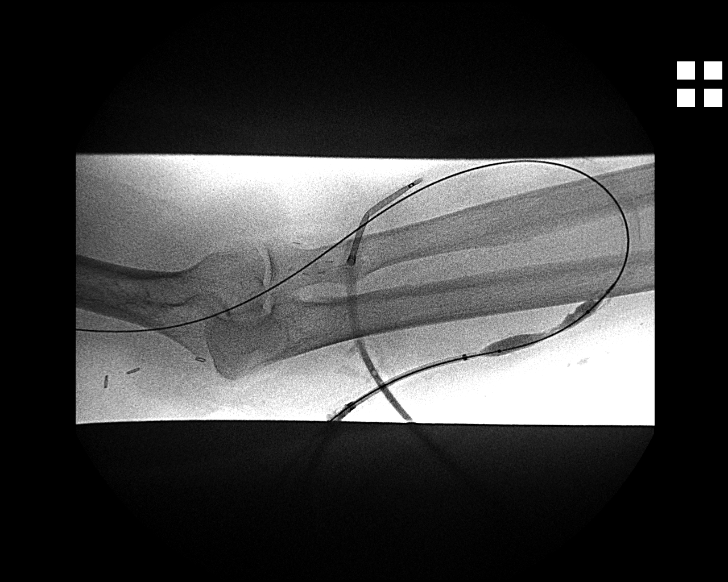
[im 20/41]
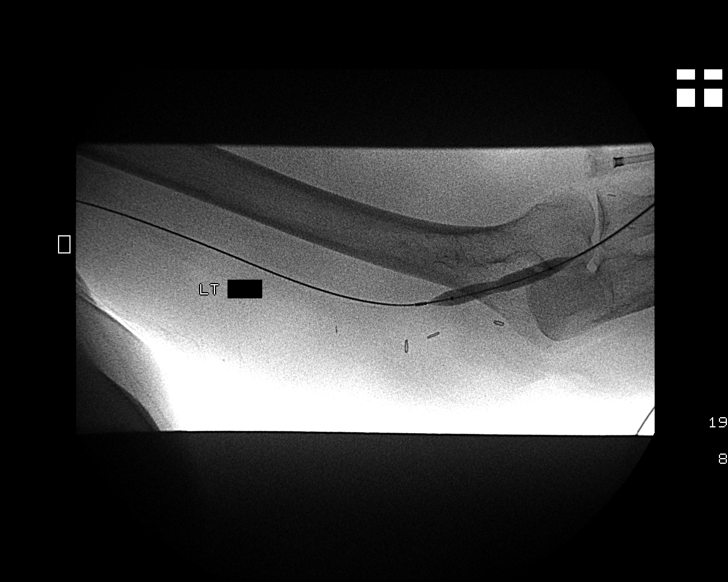
[im 23/41]
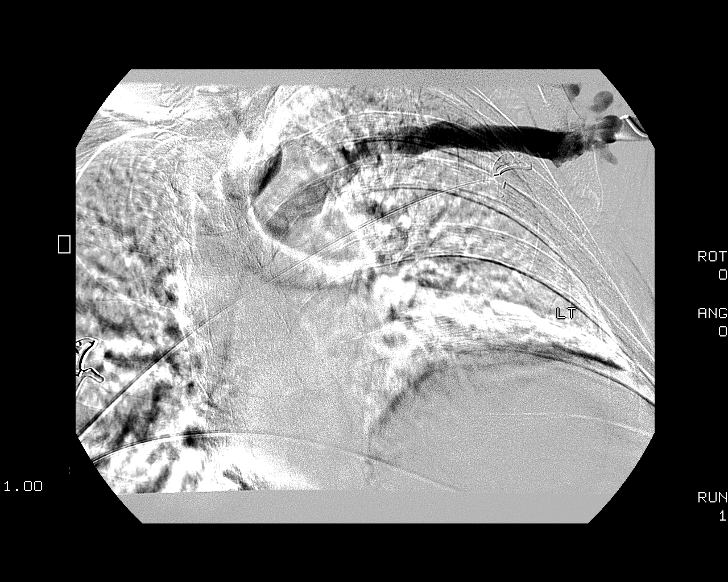
[im 27/41]
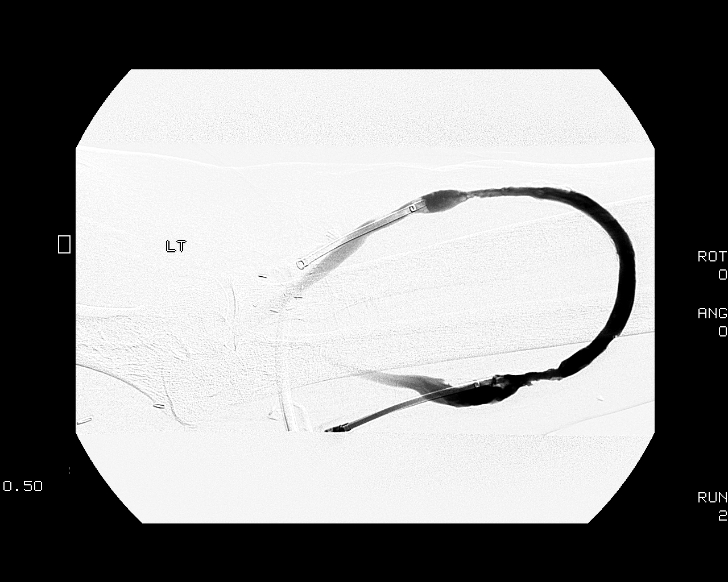
[im 30/41]
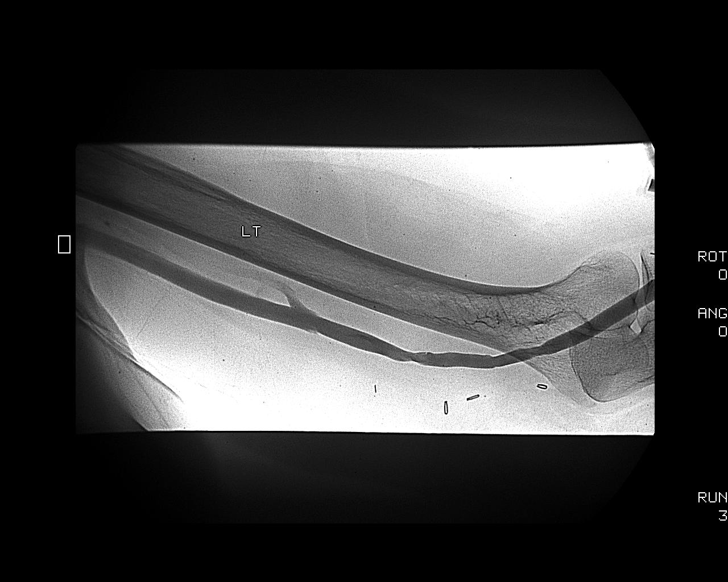
[im 34/41]
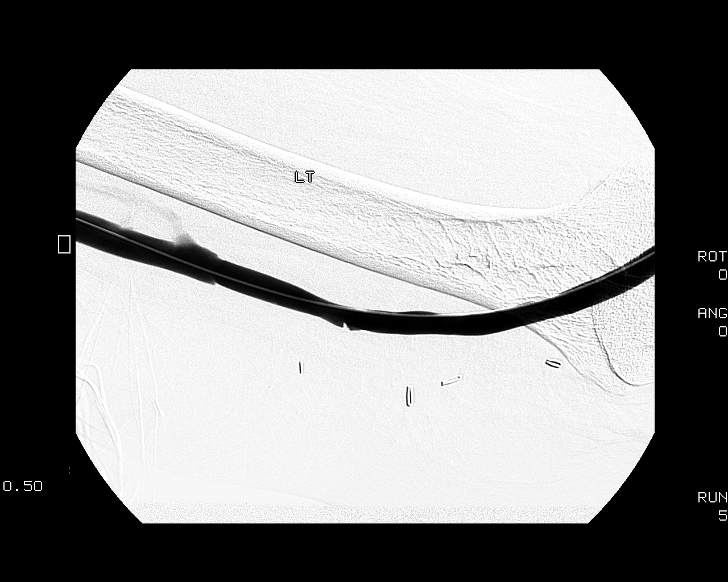
[im 37/41]
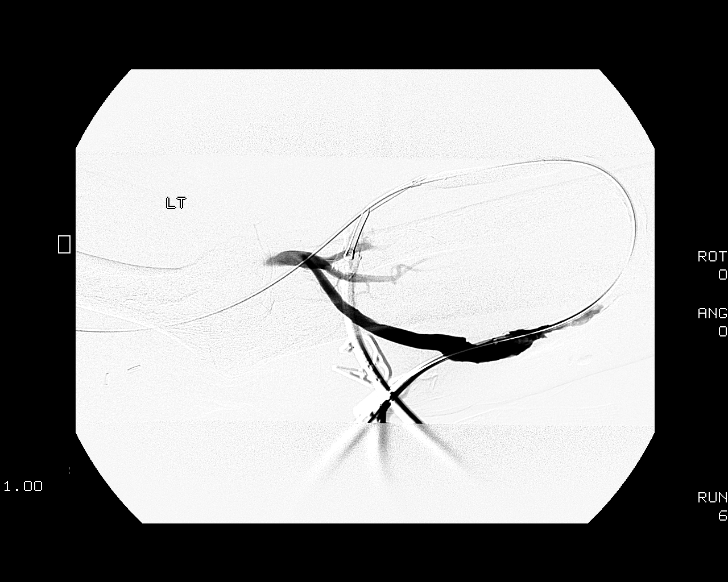
[im 41/41]
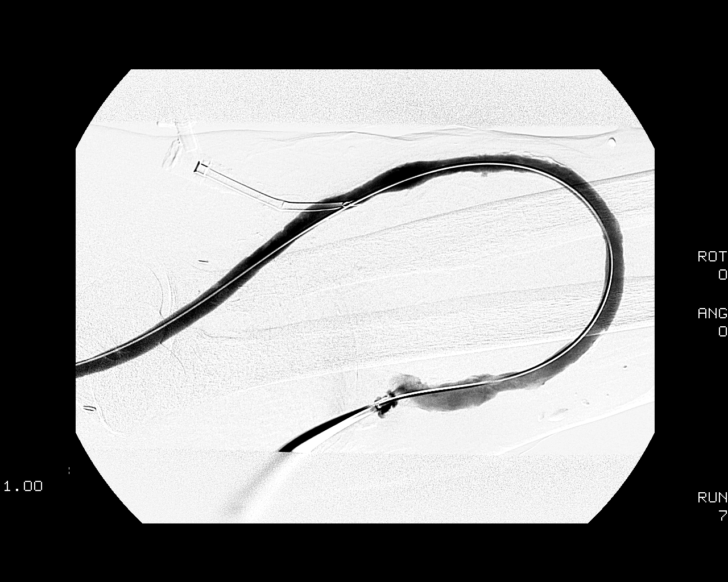

[12 of 24 positions shown; findings below may reference images not displayed]

The arterial limb of the graft was accessed antegrade with a 21-
gauge micropuncture needle under real-time ultrasonic guidance
after the overlying skin prepped with Betadine, draped in usual
sterile fashion, infiltrated locally with 1% lidocaine. Needle
exchanged over 018 guidewire for transitional dilator through which
2 mg t-PA was administered. Ultrasound imaging documentation was
saved. Through the   dilator, a Bentson wire was advanced to the
venous anastomosis. Over this a 6F sheath was placed, through which
a 5 French Kumpe catheter was advanced for outflow venography. This
showed patency of the outflow  venous system through the SVC. 7555
units heparin were administered. The Arrow PTD device was used to
macerate thrombus in the graft.

 In similar fashion, the venous limb was accessed retrograde under
ultrasound with a micropuncture needle, exchanged for a
transitional dilator. The venous limb dilator was exchanged in
similar fashion for a 6 French vascular sheath. The PTD device was
advanced across the arterial anastomosis and used to dislodge the
platelet plug  into the arterial limb of the graft, where it was
macerated. The PTD device was used again to remove any residual
platelet plug from the arterial anastomosis. Injection showed
clearance of thrombus from the graft.  There is antegrade flow.
There are tandem stenoses in both the arterial and venous limbs of
the graft as well as recurrent stenosis at the venous
anastomosis.  Balloon angioplasty of the   venous anastomosis,
arterial and venous limbs of the graft   performed using a 7 mm x 4
cm Conquest angioplasty balloon with good response.  Balloon was
removed and injection showed patency of the anastomosis, without
extravasation or other apparent complication.
  A follow shuntogram was performed, demonstrating good flow
through the outflow vein, no extravasation. Reflux across the
arterial anastomosis demonstrate this to be widely patent, with
unremarkable appearance of the visualized native arterial
circulation. The catheter and sheaths were then removed and
hemostasis achieved with 2-0 Ethilon sutures. Patient tolerated
procedure well.

IMPRESSION

1. Technically successful declot of left forearm loop synthetic
hemodialysis graft.

2. Technically successful 7mm balloon angioplasty of venous
anastomotic, as well as arterial and venous limb intragraft
stenoses..

Access management: Remains approachable for percutaneous
intervention as needed.

## 2013-09-13 IMAGING — CR DG CHEST 1V PORT
1 series · 1 of 1 positions shown · non-contrast
Comparison: 07/02/2013

CLINICAL DATA: Postop catheter insertion

PORTABLE CHEST - 1 VIEW

[AP]
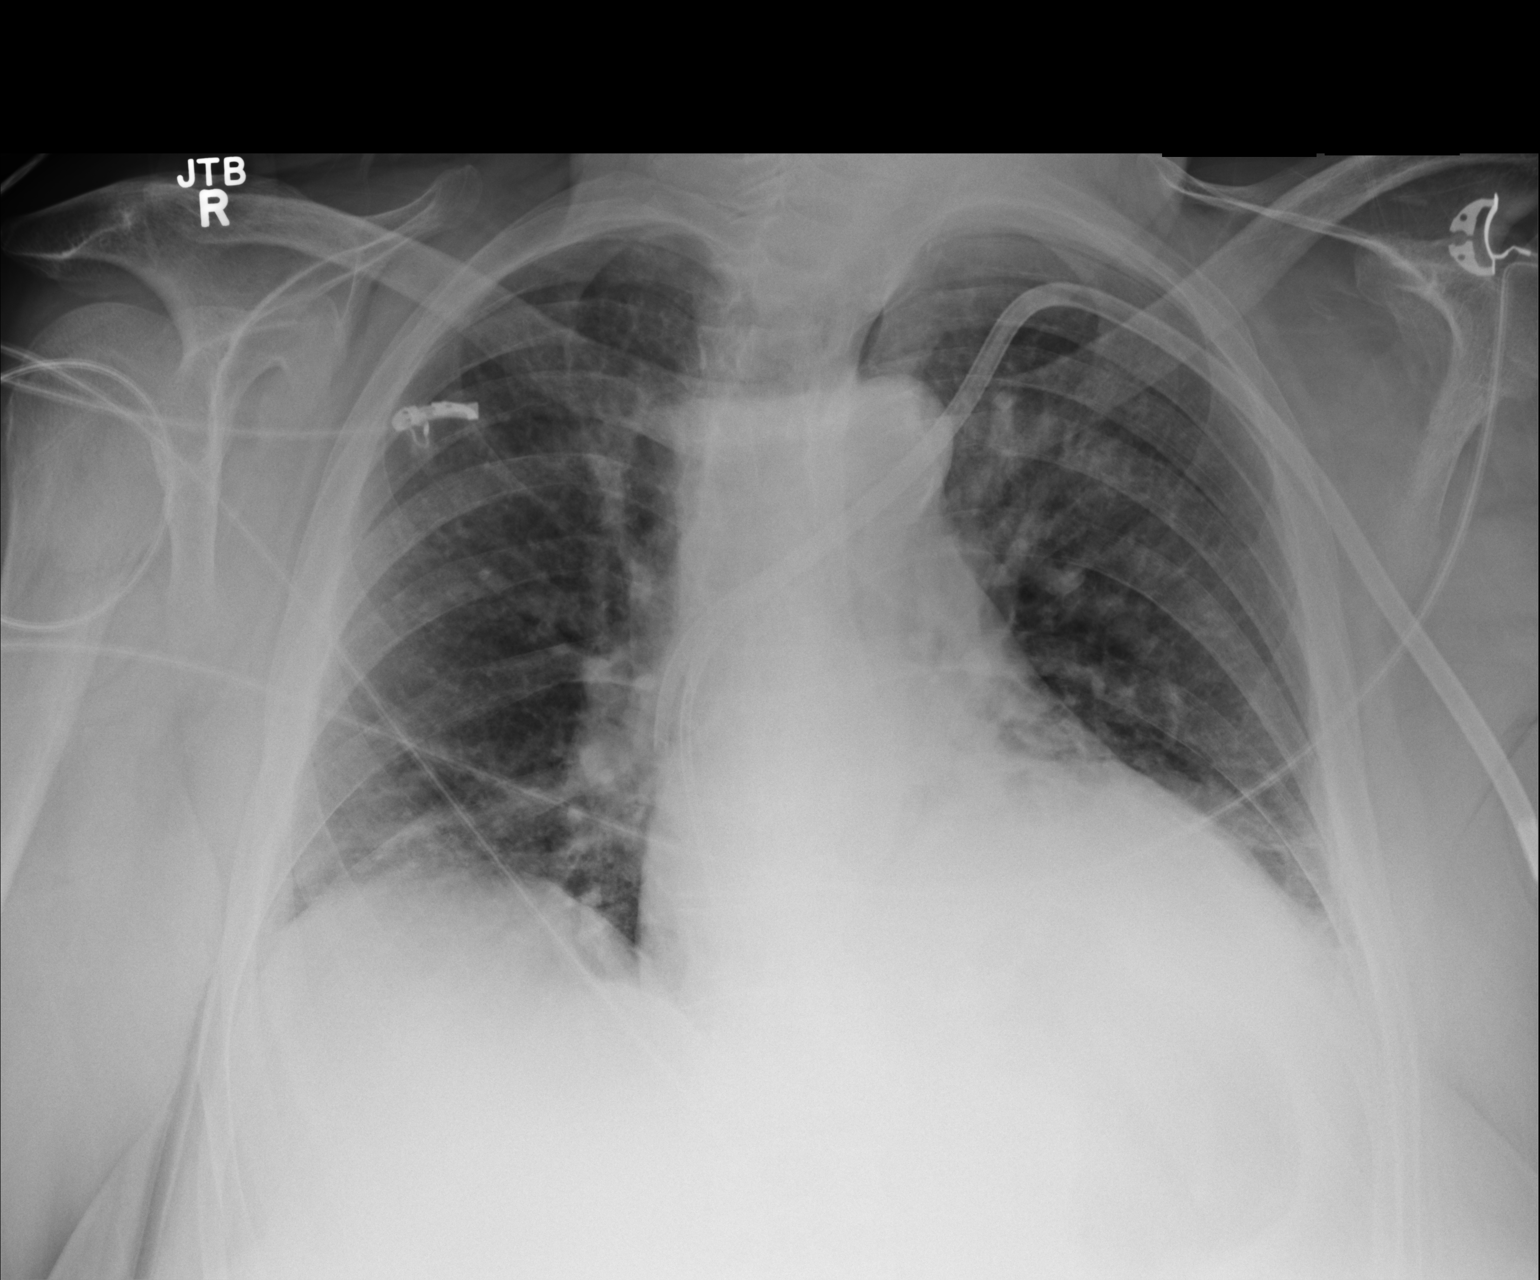

[1 of 1 positions shown; findings below may reference images not displayed]

FINDINGS: Interval placement of a large bore left-sided with split
tips in the distal SVC.  Stable enlarged heart silhouette.
Interval increase in bibasilar atelectasis.  No pneumothorax.
Patient rotated leftward.
IMPRESSION: 1..  No pneumothorax evident following catheter placement.
2.  Increase in bibasilar atelectasis.

## 2013-10-01 ENCOUNTER — Inpatient Hospital Stay (HOSPITAL_COMMUNITY): Payer: Medicare Other

## 2013-10-01 ENCOUNTER — Emergency Department (HOSPITAL_COMMUNITY): Payer: Medicare Other

## 2013-10-01 ENCOUNTER — Encounter (HOSPITAL_COMMUNITY)
Admission: EM | Disposition: A | Discharge status: Skilled Nursing Facility | Payer: Self-pay | Source: Home / Self Care | Attending: Internal Medicine

## 2013-10-01 ENCOUNTER — Inpatient Hospital Stay (HOSPITAL_COMMUNITY)
Admission: EM | Admit: 2013-10-01 | Discharge: 2013-10-04 | DRG: 480 | Disposition: A | Discharge status: Skilled Nursing Facility | Payer: Medicare Other | Attending: Internal Medicine | Admitting: Internal Medicine

## 2013-10-01 ENCOUNTER — Encounter (HOSPITAL_COMMUNITY): Payer: Medicare Other | Admitting: Anesthesiology

## 2013-10-01 ENCOUNTER — Inpatient Hospital Stay (HOSPITAL_COMMUNITY): Payer: Medicare Other | Admitting: Anesthesiology

## 2013-10-01 ENCOUNTER — Encounter (HOSPITAL_COMMUNITY): Payer: Self-pay | Admitting: Emergency Medicine

## 2013-10-01 DIAGNOSIS — Z6831 Body mass index (BMI) 31.0-31.9, adult: Secondary | ICD-10-CM

## 2013-10-01 DIAGNOSIS — D631 Anemia in chronic kidney disease: Secondary | ICD-10-CM | POA: Diagnosis present

## 2013-10-01 DIAGNOSIS — I10 Essential (primary) hypertension: Secondary | ICD-10-CM | POA: Diagnosis present

## 2013-10-01 DIAGNOSIS — S72409A Unspecified fracture of lower end of unspecified femur, initial encounter for closed fracture: Secondary | ICD-10-CM

## 2013-10-01 DIAGNOSIS — I959 Hypotension, unspecified: Secondary | ICD-10-CM | POA: Diagnosis not present

## 2013-10-01 DIAGNOSIS — Z992 Dependence on renal dialysis: Secondary | ICD-10-CM

## 2013-10-01 DIAGNOSIS — Z9889 Other specified postprocedural states: Secondary | ICD-10-CM

## 2013-10-01 DIAGNOSIS — N2581 Secondary hyperparathyroidism of renal origin: Secondary | ICD-10-CM | POA: Diagnosis present

## 2013-10-01 DIAGNOSIS — S72401A Unspecified fracture of lower end of right femur, initial encounter for closed fracture: Secondary | ICD-10-CM

## 2013-10-01 DIAGNOSIS — S72401D Unspecified fracture of lower end of right femur, subsequent encounter for closed fracture with routine healing: Secondary | ICD-10-CM

## 2013-10-01 DIAGNOSIS — E1165 Type 2 diabetes mellitus with hyperglycemia: Secondary | ICD-10-CM | POA: Diagnosis present

## 2013-10-01 DIAGNOSIS — E669 Obesity, unspecified: Secondary | ICD-10-CM | POA: Diagnosis present

## 2013-10-01 DIAGNOSIS — J9819 Other pulmonary collapse: Secondary | ICD-10-CM | POA: Diagnosis not present

## 2013-10-01 DIAGNOSIS — J4489 Other specified chronic obstructive pulmonary disease: Secondary | ICD-10-CM | POA: Diagnosis present

## 2013-10-01 DIAGNOSIS — S72453A Displaced supracondylar fracture without intracondylar extension of lower end of unspecified femur, initial encounter for closed fracture: Principal | ICD-10-CM | POA: Diagnosis present

## 2013-10-01 DIAGNOSIS — Z79899 Other long term (current) drug therapy: Secondary | ICD-10-CM

## 2013-10-01 DIAGNOSIS — E1129 Type 2 diabetes mellitus with other diabetic kidney complication: Secondary | ICD-10-CM

## 2013-10-01 DIAGNOSIS — Y92009 Unspecified place in unspecified non-institutional (private) residence as the place of occurrence of the external cause: Secondary | ICD-10-CM

## 2013-10-01 DIAGNOSIS — D62 Acute posthemorrhagic anemia: Secondary | ICD-10-CM | POA: Diagnosis not present

## 2013-10-01 DIAGNOSIS — I509 Heart failure, unspecified: Secondary | ICD-10-CM | POA: Diagnosis present

## 2013-10-01 DIAGNOSIS — I4892 Unspecified atrial flutter: Secondary | ICD-10-CM | POA: Diagnosis present

## 2013-10-01 DIAGNOSIS — W19XXXA Unspecified fall, initial encounter: Secondary | ICD-10-CM | POA: Diagnosis present

## 2013-10-01 DIAGNOSIS — Z7982 Long term (current) use of aspirin: Secondary | ICD-10-CM

## 2013-10-01 DIAGNOSIS — N186 End stage renal disease: Secondary | ICD-10-CM | POA: Diagnosis present

## 2013-10-01 DIAGNOSIS — Z96649 Presence of unspecified artificial hip joint: Secondary | ICD-10-CM

## 2013-10-01 DIAGNOSIS — E119 Type 2 diabetes mellitus without complications: Secondary | ICD-10-CM

## 2013-10-01 DIAGNOSIS — Z794 Long term (current) use of insulin: Secondary | ICD-10-CM

## 2013-10-01 DIAGNOSIS — M109 Gout, unspecified: Secondary | ICD-10-CM | POA: Diagnosis present

## 2013-10-01 DIAGNOSIS — Z87891 Personal history of nicotine dependence: Secondary | ICD-10-CM

## 2013-10-01 DIAGNOSIS — I120 Hypertensive chronic kidney disease with stage 5 chronic kidney disease or end stage renal disease: Secondary | ICD-10-CM | POA: Diagnosis present

## 2013-10-01 DIAGNOSIS — N058 Unspecified nephritic syndrome with other morphologic changes: Secondary | ICD-10-CM | POA: Diagnosis present

## 2013-10-01 DIAGNOSIS — R0902 Hypoxemia: Secondary | ICD-10-CM | POA: Diagnosis not present

## 2013-10-01 DIAGNOSIS — D696 Thrombocytopenia, unspecified: Secondary | ICD-10-CM | POA: Diagnosis not present

## 2013-10-01 DIAGNOSIS — J449 Chronic obstructive pulmonary disease, unspecified: Secondary | ICD-10-CM | POA: Diagnosis present

## 2013-10-01 HISTORY — PX: ORIF FEMUR FRACTURE: SHX2119

## 2013-10-01 LAB — GLUCOSE, CAPILLARY
Glucose-Capillary: 193 mg/dL — ABNORMAL HIGH (ref 70–99)
Glucose-Capillary: 161 mg/dL — ABNORMAL HIGH (ref 70–99)
Glucose-Capillary: 142 mg/dL — ABNORMAL HIGH (ref 70–99)
Glucose-Capillary: 172 mg/dL — ABNORMAL HIGH (ref 70–99)

## 2013-10-01 LAB — CBC WITH DIFFERENTIAL/PLATELET
Basophils Absolute: 0.1 10*3/uL (ref 0.0–0.1)
Eosinophils Relative: 1 % (ref 0–5)
HCT: 30.3 % — ABNORMAL LOW (ref 39.0–52.0)
Lymphocytes Relative: 3 % — ABNORMAL LOW (ref 12–46)
Lymphs Abs: 0.7 10*3/uL (ref 0.7–4.0)
Monocytes Absolute: 0.8 10*3/uL (ref 0.1–1.0)
Neutro Abs: 17.3 10*3/uL — ABNORMAL HIGH (ref 1.7–7.7)
Neutrophils Relative %: 91 % — ABNORMAL HIGH (ref 43–77)
Platelets: 275 10*3/uL (ref 150–400)
RDW: 17.8 % — ABNORMAL HIGH (ref 11.5–15.5)
WBC: 18.9 10*3/uL — ABNORMAL HIGH (ref 4.0–10.5)

## 2013-10-01 LAB — PROTIME-INR
INR: 1 (ref 0.00–1.49)
Prothrombin Time: 13 seconds (ref 11.6–15.2)

## 2013-10-01 LAB — POCT I-STAT TROPONIN I: Troponin i, poc: 0.04 ng/mL (ref 0.00–0.08)

## 2013-10-01 LAB — SURGICAL PCR SCREEN
MRSA, PCR: NEGATIVE
Staphylococcus aureus: NEGATIVE

## 2013-10-01 LAB — BASIC METABOLIC PANEL WITH GFR
BUN: 23 mg/dL (ref 6–23)
CO2: 27 meq/L (ref 19–32)
Calcium: 9.8 mg/dL (ref 8.4–10.5)
Chloride: 92 meq/L — ABNORMAL LOW (ref 96–112)
Creatinine, Ser: 3.84 mg/dL — ABNORMAL HIGH (ref 0.50–1.35)
GFR calc Af Amer: 16 mL/min — ABNORMAL LOW
GFR calc non Af Amer: 14 mL/min — ABNORMAL LOW
Glucose, Bld: 189 mg/dL — ABNORMAL HIGH (ref 70–99)
Potassium: 3.9 meq/L (ref 3.5–5.1)
Sodium: 135 meq/L (ref 135–145)

## 2013-10-01 SURGERY — OPEN REDUCTION INTERNAL FIXATION (ORIF) DISTAL FEMUR FRACTURE
Anesthesia: General | Site: Leg Upper | Laterality: Right | Wound class: Clean

## 2013-10-01 MED ORDER — SODIUM CHLORIDE 0.9 % IV SOLN
INTRAVENOUS | Status: DC | PRN
  Administered 2013-10-01 (×2): via INTRAVENOUS

## 2013-10-01 MED ORDER — CALCIUM CARBONATE ANTACID 500 MG PO CHEW
2.0000 | CHEWABLE_TABLET | Freq: Three times a day (TID) | ORAL | Status: DC
  Administered 2013-10-01 – 2013-10-04 (×9): 400 mg via ORAL
  Filled 2013-10-01 (×12): qty 2

## 2013-10-01 MED ORDER — METOCLOPRAMIDE HCL 5 MG/ML IJ SOLN
5.0000 mg | Freq: Three times a day (TID) | INTRAMUSCULAR | Status: DC | PRN
Start: 2013-10-01 — End: 2013-10-04
  Filled 2013-10-01: qty 2

## 2013-10-01 MED ORDER — ONDANSETRON HCL 4 MG/2ML IJ SOLN: 4.0000 mg | Freq: Once | INTRAMUSCULAR | Status: DC | PRN

## 2013-10-01 MED ORDER — MORPHINE SULFATE 2 MG/ML IJ SOLN
0.5000 mg | INTRAMUSCULAR | Status: DC | PRN
  Administered 2013-10-03: 0.5 mg via INTRAVENOUS
  Filled 2013-10-01: qty 1

## 2013-10-01 MED ORDER — METOCLOPRAMIDE HCL 5 MG PO TABS
5.0000 mg | ORAL_TABLET | Freq: Three times a day (TID) | ORAL | Status: DC | PRN
  Filled 2013-10-01: qty 2

## 2013-10-01 MED ORDER — PROPOFOL 10 MG/ML IV BOLUS
INTRAVENOUS | Status: DC | PRN
  Administered 2013-10-01: 100 mg via INTRAVENOUS

## 2013-10-01 MED ORDER — FENTANYL CITRATE 0.05 MG/ML IJ SOLN
25.0000 ug | Freq: Once | INTRAMUSCULAR | Status: AC
  Administered 2013-10-01: 25 ug via INTRAVENOUS
  Filled 2013-10-01: qty 2

## 2013-10-01 MED ORDER — LIDOCAINE HCL (CARDIAC) 20 MG/ML IV SOLN
INTRAVENOUS | Status: DC | PRN
  Administered 2013-10-01: 20 mg via INTRAVENOUS

## 2013-10-01 MED ORDER — HYDROCODONE-ACETAMINOPHEN 5-325 MG PO TABS
1.0000 | ORAL_TABLET | Freq: Four times a day (QID) | ORAL | Status: DC | PRN
  Administered 2013-10-01 (×2): 1 via ORAL
  Filled 2013-10-01 (×2): qty 1

## 2013-10-01 MED ORDER — PHENYLEPHRINE HCL 10 MG/ML IJ SOLN
10.0000 mg | INTRAVENOUS | Status: DC | PRN
  Administered 2013-10-01: 25 ug/min via INTRAVENOUS

## 2013-10-01 MED ORDER — ADULT MULTIVITAMIN W/MINERALS CH
1.0000 | ORAL_TABLET | Freq: Every day | ORAL | Status: DC
  Administered 2013-10-02 – 2013-10-04 (×3): 1 via ORAL
  Filled 2013-10-01 (×4): qty 1

## 2013-10-01 MED ORDER — ALLOPURINOL 150 MG HALF TABLET
150.0000 mg | ORAL_TABLET | Freq: Every day | ORAL | Status: DC
  Administered 2013-10-01 – 2013-10-04 (×4): 150 mg via ORAL
  Filled 2013-10-01 (×4): qty 1

## 2013-10-01 MED ORDER — HYDROCODONE-ACETAMINOPHEN 5-325 MG PO TABS
1.0000 | ORAL_TABLET | ORAL | Status: DC | PRN
  Administered 2013-10-01: 1 via ORAL
  Filled 2013-10-01: qty 1

## 2013-10-01 MED ORDER — MIDAZOLAM HCL 5 MG/5ML IJ SOLN
INTRAMUSCULAR | Status: DC | PRN
  Administered 2013-10-01: 1 mg via INTRAVENOUS

## 2013-10-01 MED ORDER — INSULIN ASPART 100 UNIT/ML ~~LOC~~ SOLN
0.0000 [IU] | SUBCUTANEOUS | Status: DC
  Administered 2013-10-01 – 2013-10-02 (×3): 2 [IU] via SUBCUTANEOUS
  Filled 2013-10-01: qty 1

## 2013-10-01 MED ORDER — RENA-VITE PO TABS
1.0000 | ORAL_TABLET | Freq: Every day | ORAL | Status: DC
  Administered 2013-10-01 – 2013-10-03 (×4): 1 via ORAL
  Filled 2013-10-01 (×4): qty 1

## 2013-10-01 MED ORDER — OXYCODONE HCL 5 MG PO TABS: 5.0000 mg | ORAL_TABLET | ORAL | Status: DC | PRN

## 2013-10-01 MED ORDER — CLINDAMYCIN PHOSPHATE 600 MG/50ML IV SOLN
600.0000 mg | Freq: Four times a day (QID) | INTRAVENOUS | Status: AC
  Administered 2013-10-01 – 2013-10-02 (×2): 600 mg via INTRAVENOUS
  Filled 2013-10-01 (×3): qty 50

## 2013-10-01 MED ORDER — ARTIFICIAL TEARS OP OINT
TOPICAL_OINTMENT | OPHTHALMIC | Status: DC | PRN
  Administered 2013-10-01: 1 via OPHTHALMIC

## 2013-10-01 MED ORDER — LACTATED RINGERS IV SOLN: INTRAVENOUS | Status: DC | PRN

## 2013-10-01 MED ORDER — ASPIRIN EC 325 MG PO TBEC
325.0000 mg | DELAYED_RELEASE_TABLET | Freq: Two times a day (BID) | ORAL | Status: DC
  Administered 2013-10-01 – 2013-10-04 (×6): 325 mg via ORAL
  Filled 2013-10-01 (×8): qty 1

## 2013-10-01 MED ORDER — CLINDAMYCIN PHOSPHATE 900 MG/50ML IV SOLN
900.0000 mg | INTRAVENOUS | Status: AC
  Administered 2013-10-01: 900 mg via INTRAVENOUS
  Filled 2013-10-01 (×2): qty 50

## 2013-10-01 MED ORDER — SODIUM CHLORIDE 0.9 % IV SOLN: INTRAVENOUS | Status: DC

## 2013-10-01 MED ORDER — ONDANSETRON HCL 4 MG PO TABS: 4.0000 mg | ORAL_TABLET | Freq: Four times a day (QID) | ORAL | Status: DC | PRN

## 2013-10-01 MED ORDER — OXYCODONE-ACETAMINOPHEN 5-325 MG PO TABS: 1.0000 | ORAL_TABLET | ORAL | Status: DC | PRN

## 2013-10-01 MED ORDER — OXYCODONE HCL 5 MG PO TABS
5.0000 mg | ORAL_TABLET | Freq: Once | ORAL | Status: DC | PRN
Start: 2013-10-01 — End: 2013-10-01

## 2013-10-01 MED ORDER — ONDANSETRON HCL 4 MG/2ML IJ SOLN: 4.0000 mg | Freq: Four times a day (QID) | INTRAMUSCULAR | Status: DC | PRN

## 2013-10-01 MED ORDER — FENTANYL CITRATE 0.05 MG/ML IJ SOLN
INTRAMUSCULAR | Status: DC | PRN
  Administered 2013-10-01 (×2): 50 ug via INTRAVENOUS

## 2013-10-01 MED ORDER — OXYCODONE HCL 5 MG/5ML PO SOLN: 5.0000 mg | Freq: Once | ORAL | Status: DC | PRN

## 2013-10-01 MED ORDER — ROCURONIUM BROMIDE 100 MG/10ML IV SOLN
INTRAVENOUS | Status: DC | PRN
  Administered 2013-10-01: 40 mg via INTRAVENOUS

## 2013-10-01 MED ORDER — ONDANSETRON HCL 4 MG/2ML IJ SOLN
INTRAMUSCULAR | Status: DC | PRN
  Administered 2013-10-01: 4 mg via INTRAMUSCULAR

## 2013-10-01 MED ORDER — HYDROMORPHONE HCL PF 1 MG/ML IJ SOLN: 0.2500 mg | INTRAMUSCULAR | Status: DC | PRN

## 2013-10-01 MED ORDER — ASPIRIN EC 81 MG PO TBEC
81.0000 mg | DELAYED_RELEASE_TABLET | Freq: Every day | ORAL | Status: DC
  Administered 2013-10-01: 81 mg via ORAL
  Filled 2013-10-01: qty 1

## 2013-10-01 MED ORDER — VITAMIN C 500 MG PO TABS
500.0000 mg | ORAL_TABLET | Freq: Every day | ORAL | Status: DC
  Administered 2013-10-01 – 2013-10-04 (×4): 500 mg via ORAL
  Filled 2013-10-01 (×4): qty 1

## 2013-10-01 MED ORDER — DARBEPOETIN ALFA-POLYSORBATE 60 MCG/0.3ML IJ SOLN
60.0000 ug | INTRAMUSCULAR | Status: DC
  Administered 2013-10-02: 60 ug via INTRAVENOUS
  Filled 2013-10-01: qty 0.3

## 2013-10-01 MED ORDER — 0.9 % SODIUM CHLORIDE (POUR BTL) OPTIME
TOPICAL | Status: DC | PRN
  Administered 2013-10-01: 1000 mL

## 2013-10-01 MED ORDER — DOXERCALCIFEROL 4 MCG/2ML IV SOLN
4.0000 ug | INTRAVENOUS | Status: DC
  Administered 2013-10-02 – 2013-10-04 (×2): 4 ug via INTRAVENOUS
  Filled 2013-10-01 (×2): qty 2

## 2013-10-01 MED ORDER — NEOSTIGMINE METHYLSULFATE 1 MG/ML IJ SOLN
INTRAMUSCULAR | Status: DC | PRN
  Administered 2013-10-01: 4 mg via INTRAVENOUS

## 2013-10-01 MED ORDER — GLYCOPYRROLATE 0.2 MG/ML IJ SOLN
INTRAMUSCULAR | Status: DC | PRN
  Administered 2013-10-01: .6 mg via INTRAVENOUS

## 2013-10-01 SURGICAL SUPPLY — 72 items
BANDAGE ELASTIC 6 VELCRO ST LF (GAUZE/BANDAGES/DRESSINGS) ×1 IMPLANT
BANDAGE ESMARK 6X9 LF (GAUZE/BANDAGES/DRESSINGS) IMPLANT
BANDAGE GAUZE ELAST BULKY 4 IN (GAUZE/BANDAGES/DRESSINGS) ×1 IMPLANT
BIT DRILL 3.2 CALIBRATED (BIT) ×1
BIT DRILL 3.2MM CALIBRATED (BIT) IMPLANT
BIT DRILL 3.8 CALIBRATED (BIT) ×1
BIT DRILL 3.8MM CALIBRATED (BIT) IMPLANT
BLADE SURG 10 STRL SS (BLADE) ×4 IMPLANT
BNDG CMPR 9X6 STRL LF SNTH (GAUZE/BANDAGES/DRESSINGS)
BNDG COHESIVE 4X5 TAN STRL (GAUZE/BANDAGES/DRESSINGS) ×2 IMPLANT
BNDG ESMARK 6X9 LF (GAUZE/BANDAGES/DRESSINGS)
BRUSH SCRUB DISP (MISCELLANEOUS) ×4 IMPLANT
CHLORAPREP W/TINT 26ML (MISCELLANEOUS) ×2 IMPLANT
CLOTH BEACON ORANGE TIMEOUT ST (SAFETY) ×2 IMPLANT
COVER SURGICAL LIGHT HANDLE (MISCELLANEOUS) ×4 IMPLANT
DRAPE C-ARM 42X72 X-RAY (DRAPES) ×2 IMPLANT
DRAPE INCISE IOBAN 66X45 STRL (DRAPES) ×2 IMPLANT
DRAPE ORTHO SPLIT 77X108 STRL (DRAPES) ×4
DRAPE SURG ORHT 6 SPLT 77X108 (DRAPES) ×2 IMPLANT
DRAPE U-SHAPE 47X51 STRL (DRAPES) ×2 IMPLANT
DRILL BIT 3.2MM CALIBRATED (BIT) ×2
DRILL BIT 3.8MM CALIBRATED (BIT) ×2
DRSG MEPILEX BORDER 4X4 (GAUZE/BANDAGES/DRESSINGS) ×6 IMPLANT
ELECT CAUTERY BLADE 6.4 (BLADE) ×2 IMPLANT
ELECT REM PT RETURN 9FT ADLT (ELECTROSURGICAL) ×2
ELECTRODE REM PT RTRN 9FT ADLT (ELECTROSURGICAL) ×1 IMPLANT
GAUZE XEROFORM 5X9 LF (GAUZE/BANDAGES/DRESSINGS) ×1 IMPLANT
GLOVE BIO SURGEON STRL SZ7 (GLOVE) ×3 IMPLANT
GLOVE BIO SURGEON STRL SZ7.5 (GLOVE) ×3 IMPLANT
GLOVE BIOGEL PI IND STRL 6.5 (GLOVE) IMPLANT
GLOVE BIOGEL PI IND STRL 7.0 (GLOVE) IMPLANT
GLOVE BIOGEL PI IND STRL 8 (GLOVE) ×1 IMPLANT
GLOVE BIOGEL PI INDICATOR 6.5 (GLOVE) ×1
GLOVE BIOGEL PI INDICATOR 7.0 (GLOVE) ×1
GLOVE BIOGEL PI INDICATOR 8 (GLOVE) ×2
GLOVE ECLIPSE 8.0 STRL XLNG CF (GLOVE) ×1 IMPLANT
GLOVE SURG SS PI 6.0 STRL IVOR (GLOVE) ×1 IMPLANT
GOWN PREVENTION PLUS LG XLONG (DISPOSABLE) ×2 IMPLANT
GOWN STRL NON-REIN LRG LVL3 (GOWN DISPOSABLE) ×8 IMPLANT
GUIDEPIN 3.2  ENDO CALB STRL (PIN) ×1
GUIDEPIN 3.2 ENDO CALB STRL (PIN) IMPLANT
K-WIRE ACE 1.6X6 (WIRE) ×2
KIT BASIN OR (CUSTOM PROCEDURE TRAY) ×2 IMPLANT
KIT ROOM TURNOVER OR (KITS) ×2 IMPLANT
KWIRE ACE 1.6X6 (WIRE) IMPLANT
PACK ORTHO EXTREMITY (CUSTOM PROCEDURE TRAY) ×2 IMPLANT
PAD ARMBOARD 7.5X6 YLW CONV (MISCELLANEOUS) ×4 IMPLANT
PLATE FEMORAL LOCK 6 HOLE RT (Plate) ×1 IMPLANT
SCREW LOCK PLY DIST FEM 4.5X40 (Screw) ×1 IMPLANT
SCREW LOCK PLY DIST FEM 4.5X42 (Screw) ×1 IMPLANT
SCREW LOCK PLY PROX TIB 8X80 (Screw) ×1 IMPLANT
SCREW NLOCK CORT 4.5X42 (Screw) ×1 IMPLANT
SCREW NLOCK CORT STAR 4.5X38 (Screw) ×1 IMPLANT
SCREW NLOCK CORT STAR 4.5X46 (Screw) ×2 IMPLANT
SCREW NLOCK DIST FEM 5.5X80 (Screw) ×1 IMPLANT
SCREW NLOCK DIST FEM 5.5X85 (Screw) ×3 IMPLANT
SPONGE GAUZE 4X4 12PLY (GAUZE/BANDAGES/DRESSINGS) ×1 IMPLANT
SPONGE LAP 18X18 X RAY DECT (DISPOSABLE) ×1 IMPLANT
STAPLER VISISTAT (STAPLE) ×2 IMPLANT
STAPLER VISISTAT 35W (STAPLE) ×3 IMPLANT
SUT PROLENE 3 0 PS 2 (SUTURE) IMPLANT
SUT VIC AB 0 CT1 27 (SUTURE)
SUT VIC AB 0 CT1 27XBRD ANBCTR (SUTURE) IMPLANT
SUT VIC AB 2-0 CT1 27 (SUTURE)
SUT VIC AB 2-0 CT1 TAPERPNT 27 (SUTURE) IMPLANT
SUT VIC AB 2-0 CT3 27 (SUTURE) IMPLANT
SUT VIC AB 3-0 SH 18 (SUTURE) ×2 IMPLANT
TOWEL OR 17X24 6PK STRL BLUE (TOWEL DISPOSABLE) ×2 IMPLANT
TOWEL OR 17X26 10 PK STRL BLUE (TOWEL DISPOSABLE) ×4 IMPLANT
TRAY FOLEY CATH 16FRSI W/METER (SET/KITS/TRAYS/PACK) ×2 IMPLANT
TUBE CONNECTING 12X1/4 (SUCTIONS) ×2 IMPLANT
YANKAUER SUCT BULB TIP NO VENT (SUCTIONS) ×3 IMPLANT

## 2013-10-01 NOTE — Op Note (Signed)
Procedure(s): OPEN REDUCTION INTERNAL FIXATION (ORIF) DISTAL FEMUR FRACTURE Procedure Note  SIRIS HOOS male 72 y.o. 10/01/2013  Procedure(s) and Anesthesia Type:    * OPEN REDUCTION INTERNAL FIXATION (ORIF) RIGHT  DISTAL FEMUR FRACTURE - General      Surgeon: Mable Paris   Assistants: Damita Lack PA-C (Danielle was present and scrubbed throughout the procedure and was essential in positioning, retraction, exposure, and closure)  Anesthesia: General endotracheal anesthesia    Procedure Detail  OPEN REDUCTION INTERNAL FIXATION (ORIF) DISTAL FEMUR FRACTURE  Estimated Blood Loss:  less than 100 mL         Drains: none  Blood Given: none         Specimens: none        Complications:  * No complications entered in OR log *         Disposition: PACU - hemodynamically stable.         Condition: stable    Procedure:   INDICATIONS FOR SURGERY: The patient is a 72 year old male who went to get up with his walker last night walker was not locked. He fell forward onto his right knee. He had immediate pain inability to weight-bear. He was seen in the emergency department where x-rays revealed a supracondylar femur fracture. He was indicated for surgical treatment to restore anatomy and try and maximize chance of recovering ambulation.  OPERATIVE FINDINGS: Near-anatomic reduction using a Biomet Polyax locking distal femoral plate. No intra-articular extension.  DESCRIPTION OF PROCEDURE: The patient was identified in preoperative  holding area where I personally marked the operative site after  verifying site, side, and procedure with the patient. The patient was taken back  to the operating room where general anesthesia was induced without  Complication. The patient did receive preoperative IV antibiotics. He was kept in the supine position with a bump under the right hip. A right lower extremity was prepped and draped in standard sterile fashion.  Fluoroscopic imaging was used to verify appropriate reduction with traction on a radiolucent triangle. A. approximately 6 cm incision was made centered over the distal lateral condyle. Dissection was carried down through the IT band lateral condyle was run up submuscularly. The 6-hole plate was then placed submuscularly and was appropriately positioned with fluoroscopic imaging. A K wire was used to temporarily hold the plate. The distal guidewire was placed for the 8 mm cannulated screw and a proximal nonlocking shaft screw was then placed percutaneously bringing the plate to the distal shaft. The reduction was held. The reduction was verified in AP and lateral planes with fluoroscopy and the large 8 mm distal cannulated screw was placed and locked into the plate. At this point all of the distal locking screws were placed through the plate. Subsequently a nonlocking screw was placed the most proximal aspect of the plate and 2 additional locking screws were placed through the shaft. Final fluoroscopic imaging in AP and lateral planes demonstrated near-anatomic reduction of the fracture with appropriate alignment. Wounds are copiously irrigated normal saline and subsequently closed in layers with 0 Vicryl in deep fascial layer, 2-0 Vicryl the subcutaneous layer and staples for skin closure. Sterile dressings were applied including Xeroform 4 x 4's Kerlix and an Ace bandage. The patient was placed back in a knee immobilizer. He was allowed to awaken from general anesthesia transferred to the stretcher and taken to the recovery room in stable condition.   POSTOPERATIVE PLAN: He will be readmitted to the hospitalist service. He will be nonweightbearing  on the operative extremity. He can begin some gentle knee range of motion exercises when comfortable. He will have aspirin twice daily for DVT prophylaxis and SCDs while in the hospital

## 2013-10-01 NOTE — Progress Notes (Signed)
[facility]                                                                                Patient Demographics  [name], is a 72 y.o. male, DOB - 1941/07/03, WUJ:[id number]  Admit date - 10/01/2013   Admitting Physician Hillary Bow, DO  Outpatient Primary MD for the patient is Pearson Grippe, MD  LOS - 0   Chief Complaint  Patient presents with  . Fall        Assessment & Plan  Right distal femoral fracture -Patient scheduled for surgical intervention this afternoon with orthopedics. -Will continue pain management  End Stage Renal Disease -Hemodialysis Monday, Wednesday, Friday. Nephrology consulted.  Diabetes mellitus, insulin-dependent -Currently n.p.o. do to upcoming surgery. -Will continue insulin sliding scale low-dose and Accu-Cheks every 4 hours  Principal Problem:   Fracture of femur, distal, right, closed Active Problems:   End stage renal disease   IDDM (insulin dependent diabetes mellitus)   Unspecified essential hypertension   Code Status: Full  Family Communication: None  Disposition Plan: Admitted   Procedures Surgery  Consults  Orthopedic Surgery  DVT Prophylaxis  SCDs   Lab Results  Component Value Date   PLT 275 10/01/2013    Medications  Scheduled Meds: . allopurinol  150 mg Oral Daily  . aspirin EC  81 mg Oral Daily  . insulin aspart  0-9 Units Subcutaneous Q4H  . multivitamin  1 tablet Oral QHS  . multivitamin with minerals  1 tablet Oral Daily  . vitamin C  500 mg Oral Daily   Continuous Infusions:  PRN Meds:.HYDROcodone-acetaminophen, morphine injection  Antibiotics    Anti-infectives   None       Time Spent in minutes   20 minutes   Dontel Harshberger D.O. on 10/01/2013 at 10:54 AM  Between 7am to 7pm - Pager - [phone]  After 7pm go to [email or web address] - password [email or web address]  And look for the night coverage person covering for me after hours  [facility] Office  [phone]     Subjective:   [name] seen and examined today.  Currently uncomfortable due to right leg pain. Patient denies dizziness, chest pain, shortness of breath, abdominal pain, N/V/D/C, new weakness, numbess, tingling.    Objective:   Filed Vitals:   10/01/13 0515 10/01/13 0600 10/01/13 0800 10/01/13 0820  BP: 104/44 129/52  113/45  Pulse: 106 107  106  Temp:  98 F (36.7 C)  97.7 F (36.5 C)  TempSrc:  Oral  Oral  Resp: 23  18 20   Height:  6\' 1"  (1.854 m)    Weight:  105.915 kg (233 lb 8 oz)    SpO2: 92% 96% 95% 96%    Wt Readings from Last 3 Encounters:  10/01/13 105.915 kg (233 lb 8 oz)  10/01/13 105.915 kg (233 lb 8 oz)  07/02/13 104.2 kg (229 lb 11.5 oz)     Intake/Output Summary (Last 24 hours) at 10/01/13 1054 Last data filed at 10/01/13 0659  Gross per 24 hour  Intake      0 ml  Output      0 ml  Net  0 ml    Exam  General: Well developed, well nourished, NAD, appears stated age  HEENT: NCAT, PERRLA, EOMI, Anicteic Sclera, mucous membranes moist. No pharyngeal erythema or exudates  Neck: Supple, no JVD, no masses  Cardiovascular: S1 S2 auscultated, no rubs, murmurs or gallops.   Respiratory: Clear to auscultation bilaterally with equal chest rise  Abdomen: Soft, obese, non-tender to palpation, + bowel sounds  Extremities: warm dry without cyanosis clubbing. Right lower extremity and stabilizer. Edema noted in lower joints bilaterally.  Neuro: AAOx3, cranial nerves grossly intact.   Skin: Without rashes exudates or nodules  Psych: Normal affect and demeanor with intact judgement and insight   Data Review   Micro Results No results found for this or any previous visit (from the past 240 hour(s)).  Radiology Reports Dg Chest 1 View  10/01/2013   CLINICAL DATA:  Fall with no chest complaints.  EXAM: CHEST - 1 VIEW  COMPARISON:  07/02/2013.  FINDINGS: Cardiomegaly. Calcified tortuous aorta. Mild vascular congestion. Mild basilar atelectasis.  Osseous demineralization. Dialysis catheter has been removed since the previous radiograph.  IMPRESSION: Cardiomegaly without active infiltrates or failure. Improved aeration from priors.   Electronically Signed   By: Davonna Belling M.D.   On: 10/01/2013 01:57   Dg Hip Complete Right  10/01/2013   *RADIOLOGY REPORT*  Clinical Data: Right knee pain, fall.  History of right hip surgery.  RIGHT HIP - COMPLETE 2+ VIEW  Comparison: None available at time of study interpretation.  Findings: Five images of the pelvis and right hip.  Status post right hip total arthroplasty, hardware appears intact well seated without periprosthetic lucency.  No fracture deformity or dislocation.  Severe left hip joint space narrowing with mild marginal spurring.  Sacroiliac joints are symmetric.  Bone mineral density is decreased without destructive bony lesions.  Moderate vascular calcification.  Periarticular soft tissue planes are not suspicious.  IMPRESSION: No acute fracture deformity or dislocation.  Status post right hip total arthroplasty without radiographic findings of hardware failure.  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   Original Report Authenticated By: Awilda Metro   Dg Knee Complete 4 Views Right  10/01/2013   *RADIOLOGY REPORT*  Clinical Data: Fall, knee pain.  RIGHT KNEE - COMPLETE 4+ VIEW  Comparison: None available at time of study interpretation.  Findings: Comminuted distal femur metadiaphyseal impacted fracture with slight medial deviation of the distal bony fragments.  No definite interarticular extension.  No destructive bony lesions. Bone mineral density is decreased.  Moderate suprapatellar joint effusion.  Mild vascular calcifications.  IMPRESSION: Minimally displaced distal femur impacted metadiaphyseal fracture without dislocation.  Osteopenia.   Original Report Authenticated By: Awilda Metro    CBC  Recent Labs Lab 10/01/13 0305  WBC 18.9*  HGB 9.7*  HCT 30.3*  PLT 275  MCV  93.2  MCH 29.8  MCHC 32.0  RDW 17.8*  LYMPHSABS 0.7  MONOABS 0.8  EOSABS 0.1  BASOSABS 0.1    Chemistries   Recent Labs Lab 10/01/13 0305  NA 135  K 3.9  CL 92*  CO2 27  GLUCOSE 189*  BUN 23  CREATININE 3.84*  CALCIUM 9.8   ------------------------------------------------------------------------------------------------------------------ estimated creatinine clearance is 21.6 ml/min (by C-G formula based on Cr of 3.84). ------------------------------------------------------------------------------------------------------------------ No results found for this basename: HGBA1C,  in the last 72 hours ------------------------------------------------------------------------------------------------------------------ No results found for this basename: CHOL, HDL, LDLCALC, TRIG, CHOLHDL, LDLDIRECT,  in the last 72 hours ------------------------------------------------------------------------------------------------------------------ No results found for this basename: TSH,  T4TOTAL, FREET3, T3FREE, THYROIDAB,  in the last 72 hours ------------------------------------------------------------------------------------------------------------------ No results found for this basename: VITAMINB12, FOLATE, FERRITIN, TIBC, IRON, RETICCTPCT,  in the last 72 hours  Coagulation profile  Recent Labs Lab 10/01/13 0305  INR 1.00    No results found for this basename: DDIMER,  in the last 72 hours  Cardiac Enzymes No results found for this basename: CK, CKMB, TROPONINI, MYOGLOBIN,  in the last 168 hours ------------------------------------------------------------------------------------------------------------------ No components found with this basename: POCBNP,

## 2013-10-01 NOTE — ED Provider Notes (Signed)
72 yo man with ESRD and IDDM who ambulates with a walker due to generalized and chronic bilateral leg weakness. Sustained a right distal femur fx secondary to mechanical fall which occurred just PTA. Pain controlled in ED. Hematoma and STS with ttp over entire right thigh. Unable to palpate either the left or the right DP pulse. I suspect this is secondary to chronic PAD. Sensation is intact to light touch throughout. We will order pre-op studies, admit to IM, and consult orthopedics. Patient was last dialyzed about 16 hrs ago and received full run.   Brandt Loosen, MD 10/01/13 [id number]

## 2013-10-01 NOTE — ED Notes (Signed)
Pt to ED for evaluation of a fall from standing.  Pt stood from recliner to standing with walker- walker was not locked and pt fell to ground hitting his right knee and left side of his head.  Significant swelling noted to right knee, distal pulses strong and intact.  Skin tear also noted to left elbow- EMS applied Tegaderm, bleeding controlled at present.  Pt is dialysis pt- M/W/F.  Alert and oriented X 4 at present.

## 2013-10-01 NOTE — Consult Note (Signed)
Reason for Consult: Evaluate right supracondylar femur fracture Referring Physician: Velva Harman is an 72 y.o. male.  HPI: The patient is a 72 year old male with a history of right total hip replacement. He has used a walker for ambulation. He was getting up out of a chair and forgot to lock his walker last night. He slept for: On to his right knee. He had immediate pain and inability to weight-bear. Pain is worse with movement, better with rest. Denies other injury with this fall. He is on dialysis for chronic kidney disease.  Past Medical History  Diagnosis Date  . Diabetes mellitus   . Hypertension   . Atrial flutter   . Obesity   . COPD (chronic obstructive pulmonary disease)   . Gout   . CHF (congestive heart failure)   . Anemia   . Chronic kidney disease     Hemo [facility].     Past Surgical History  Procedure Laterality Date  . Joint replacement      rt hip  . Arteriovenous graft placement  2008    left arm  . Av fistula placement Left 07/02/2013    Procedure: INSERTION OF ARTERIOVENOUS (AV) GORE-TEX GRAFT ARM;  Surgeon: Chuck Hint, MD;  Location: [facility] OR;  Service: Vascular;  Laterality: Left;  . Ligation arteriovenous gortex graft Left 07/02/2013    Procedure: LIGATION ARTERIOVENOUS GORTEX GRAFT;  Surgeon: Chuck Hint, MD;  Location: [facility] OR;  Service: Vascular;  Laterality: Left;  . Insertion of dialysis catheter N/A 07/02/2013    Procedure: INSERTION OF DIALYSIS CATHETER;  Surgeon: Chuck Hint, MD;  Location: [facility] OR;  Service: Vascular;  Laterality: N/A;  Left Internal Jugular Placement    Family History  Problem Relation Age of Onset  . Heart attack Mother   . Heart attack Father   . Diabetes Sister   . Diabetes Brother     Social History:  reports that he quit smoking about 28 years ago. His smoking use included Cigarettes. He smoked 0.00 packs per day. He does not have any smokeless tobacco history on file. He reports that he  does not drink alcohol or use illicit drugs.  Allergies:  Allergies  Allergen Reactions  . Penicillins Rash  . Sulfa Antibiotics Rash  . Glucophage [Metformin Hydrochloride] Rash    Medications: I have reviewed the patient's current medications.  Results for orders placed during the hospital encounter of 10/01/13 (from the past 48 hour(s))  TYPE AND SCREEN     Status: None   Collection Time    10/01/13  2:03 AM      Result Value Range   ABO/RH(D) O POS     Antibody Screen NEG     Sample Expiration 10/04/2013    CBC WITH DIFFERENTIAL     Status: Abnormal   Collection Time    10/01/13  3:05 AM      Result Value Range   WBC 18.9 (*) 4.0 - 10.5 K/uL   RBC 3.25 (*) 4.22 - 5.81 MIL/uL   Hemoglobin 9.7 (*) 13.0 - 17.0 g/dL   HCT 47.8 (*) 29.5 - 62.1 %   MCV 93.2  78.0 - 100.0 fL   MCH 29.8  26.0 - 34.0 pg   MCHC 32.0  30.0 - 36.0 g/dL   RDW 30.8 (*) 65.7 - 84.6 %   Platelets 275  150 - 400 K/uL   Neutrophils Relative % 91 (*) 43 - 77 %   Neutro  Abs 17.3 (*) 1.7 - 7.7 K/uL   Lymphocytes Relative 3 (*) 12 - 46 %   Lymphs Abs 0.7  0.7 - 4.0 K/uL   Monocytes Relative 4  3 - 12 %   Monocytes Absolute 0.8  0.1 - 1.0 K/uL   Eosinophils Relative 1  0 - 5 %   Eosinophils Absolute 0.1  0.0 - 0.7 K/uL   Basophils Relative 0  0 - 1 %   Basophils Absolute 0.1  0.0 - 0.1 K/uL  BASIC METABOLIC PANEL     Status: Abnormal   Collection Time    10/01/13  3:05 AM      Result Value Range   Sodium 135  135 - 145 mEq/L   Potassium 3.9  3.5 - 5.1 mEq/L   Chloride 92 (*) 96 - 112 mEq/L   CO2 27  19 - 32 mEq/L   Glucose, Bld 189 (*) 70 - 99 mg/dL   BUN 23  6 - 23 mg/dL   Creatinine, Ser 4.54 (*) 0.50 - 1.35 mg/dL   Calcium 9.8  8.4 - 09.8 mg/dL   GFR calc non Af Amer 14 (*) >90 mL/min   GFR calc Af Amer 16 (*) >90 mL/min   Comment: (NOTE)     The eGFR has been calculated using the CKD EPI equation.     This calculation has not been validated in all clinical situations.     eGFR's  persistently <90 mL/min signify possible Chronic Kidney     Disease.  PROTIME-INR     Status: None   Collection Time    10/01/13  3:05 AM      Result Value Range   Prothrombin Time 13.0  11.6 - 15.2 seconds   INR 1.00  0.00 - 1.49  POCT I-STAT TROPONIN I     Status: None   Collection Time    10/01/13  3:20 AM      Result Value Range   Troponin i, poc 0.04  0.00 - 0.08 ng/mL   Comment 3            Comment: Due to the release kinetics of cTnI,     a negative result within the first hours     of the onset of symptoms does not rule out     myocardial infarction with certainty.     If myocardial infarction is still suspected,     repeat the test at appropriate intervals.  GLUCOSE, CAPILLARY     Status: Abnormal   Collection Time    10/01/13  4:06 AM      Result Value Range   Glucose-Capillary 193 (*) 70 - 99 mg/dL    Dg Chest 1 View  11/91/4782   CLINICAL DATA:  Fall with no chest complaints.  EXAM: CHEST - 1 VIEW  COMPARISON:  07/02/2013.  FINDINGS: Cardiomegaly. Calcified tortuous aorta. Mild vascular congestion. Mild basilar atelectasis. Osseous demineralization. Dialysis catheter has been removed since the previous radiograph.  IMPRESSION: Cardiomegaly without active infiltrates or failure. Improved aeration from priors.   Electronically Signed   By: Davonna Belling M.D.   On: 10/01/2013 01:57   Dg Hip Complete Right  10/01/2013   *RADIOLOGY REPORT*  Clinical Data: Right knee pain, fall.  History of right hip surgery.  RIGHT HIP - COMPLETE 2+ VIEW  Comparison: None available at time of study interpretation.  Findings: Five images of the pelvis and right hip.  Status post right hip total arthroplasty, hardware appears intact  well seated without periprosthetic lucency.  No fracture deformity or dislocation.  Severe left hip joint space narrowing with mild marginal spurring.  Sacroiliac joints are symmetric.  Bone mineral density is decreased without destructive bony lesions.  Moderate  vascular calcification.  Periarticular soft tissue planes are not suspicious.  IMPRESSION: No acute fracture deformity or dislocation.  Status post right hip total arthroplasty without radiographic findings of hardware failure.  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   Original Report Authenticated By: Awilda Metro   Dg Knee Complete 4 Views Right  10/01/2013   *RADIOLOGY REPORT*  Clinical Data: Fall, knee pain.  RIGHT KNEE - COMPLETE 4+ VIEW  Comparison: None available at time of study interpretation.  Findings: Comminuted distal femur metadiaphyseal impacted fracture with slight medial deviation of the distal bony fragments.  No definite interarticular extension.  No destructive bony lesions. Bone mineral density is decreased.  Moderate suprapatellar joint effusion.  Mild vascular calcifications.  IMPRESSION: Minimally displaced distal femur impacted metadiaphyseal fracture without dislocation.  Osteopenia.   Original Report Authenticated By: Awilda Metro    Review of Systems  All other systems reviewed and are negative.   Blood pressure 129/52, pulse 107, temperature 98 F (36.7 C), temperature source Oral, resp. rate 23, height 6\' 1"  (1.854 m), weight 105.915 kg (233 lb 8 oz), SpO2 96.00%. Physical Exam  Constitutional: He is oriented to person, place, and time. He appears well-developed and well-nourished.  HENT:  Head: Atraumatic.  Eyes: EOM are normal.  Cardiovascular: Intact distal pulses.   Respiratory: Effort normal.  Musculoskeletal:  Bilateral upper extremities atraumatic. Right lower extremity in a knee immobilizer. Skin is intact. Mild to moderate swelling about the knee. No calf tenderness or swelling. He has chronic venous changes in the anterior tibia. Distally he lacks dorsiflexion of the ankle which he says is a chronic problem.  Neurological: He is alert and oriented to person, place, and time.  Skin: Skin is warm and dry.  Psychiatric: He has a normal mood  and affect.    Assessment/Plan: Right supracondylar femur fracture in 72 year old male dialysis for chronic kidney disease and was previously ambulating with a walker.  Recommend surgical fixation to restore anatomy and maximize chance of returning to normal ambulation. Risks benefits alternatives discussed. He will remain n.p.o. for surgery this afternoon to include a locking plate. All questions were welcomed and answered Appreciate hospitalist admission.   Mable Paris 10/01/2013, 8:16 AM

## 2013-10-01 NOTE — ED Provider Notes (Signed)
Medical screening examination/treatment/procedure(s) were performed by non-physician practitioner and as supervising physician I was immediately available for consultation/collaboration.   Miaa Latterell, MD 10/01/13 0814 

## 2013-10-01 NOTE — Anesthesia Postprocedure Evaluation (Signed)
  Anesthesia Post-op Note  Patient: [name]  Procedure(s) Performed: Procedure(s): OPEN REDUCTION INTERNAL FIXATION (ORIF) DISTAL FEMUR FRACTURE (Right)  Patient Location: PACU  Anesthesia Type:General  Level of Consciousness: awake, alert , oriented and patient cooperative  Airway and Oxygen Therapy: Patient Spontanous Breathing and Patient connected to nasal cannula oxygen  Post-op Pain: none  Post-op Assessment: Post-op Vital signs reviewed, Patient's Cardiovascular Status Stable, Respiratory Function Stable, Patent Airway, No signs of Nausea or vomiting and Pain level controlled  Post-op Vital Signs: Reviewed and stable  Complications: No apparent anesthesia complications

## 2013-10-01 NOTE — Preoperative (Signed)
Beta Blockers   Reason not to administer Beta Blockers:Not Applicable 

## 2013-10-01 NOTE — Consult Note (Signed)
[facility] Renal Consultation Note    Indication for Consultation:  Management of ESRD/hemodialysis; anemia, hypertension/volume and secondary hyperparathyroidism  HPI: [name] is a pleasant 72 y.o. male ESRD patient (MWF HD) with past medical history significant for DM and right total hip replacement who presented to the ED after sustaining a ground level fall last evening. He states and that he forgot to put the brakes on is walker when attempting to stand and it shot away from him. He landed on his right knee, noticed significant swelling and was unable to bear weight. When his wife was unable to offer any assistance, EMS was called.  X-rays of his right hip show no acute process, however right knee films are significant for joint effusion and closed distal femur fracture. Ortho has been consulted and the plan if for surgical fixation later this afternoon. At the time of this encounter he denies overt pain but endorses a "throbbing sensation" to his right lower extremity. He is unable to bear weight or lift his right leg. He feels "it was just stupid" to allow this to happen.   Dialysis sessions have been going well per patient. His normal schedule is MWF at [facility] and his last treatment was yesterday.  Past Medical History  Diagnosis Date  . Diabetes mellitus   . Hypertension   . Atrial flutter   . Obesity   . COPD (chronic obstructive pulmonary disease)   . Gout   . CHF (congestive heart failure)   . Anemia   . Chronic kidney disease     Hemo M- W F.    Past Surgical History  Procedure Laterality Date  . Joint replacement      rt hip  . Arteriovenous graft placement  2008    left arm  . Av fistula placement Left 07/02/2013    Procedure: INSERTION OF ARTERIOVENOUS (AV) GORE-TEX GRAFT ARM;  Surgeon: Chuck Hint, MD;  Location: [facility] OR;  Service: Vascular;  Laterality: Left;  . Ligation arteriovenous gortex graft Left 07/02/2013    Procedure:  LIGATION ARTERIOVENOUS GORTEX GRAFT;  Surgeon: Chuck Hint, MD;  Location: [facility] OR;  Service: Vascular;  Laterality: Left;  . Insertion of dialysis catheter N/A 07/02/2013    Procedure: INSERTION OF DIALYSIS CATHETER;  Surgeon: Chuck Hint, MD;  Location: [facility] OR;  Service: Vascular;  Laterality: N/A;  Left Internal Jugular Placement   Family History  Problem Relation Age of Onset  . Heart attack Mother   . Heart attack Father   . Diabetes Sister   . Diabetes Brother    Social History:  reports that he quit smoking about 28 years ago. His smoking use included Cigarettes. He smoked 0.00 packs per day. He does not have any smokeless tobacco history on file. He reports that he does not drink alcohol or use illicit drugs. Married and lives with wife Allergies  Allergen Reactions  . Penicillins Rash  . Sulfa Antibiotics Rash  . Glucophage [Metformin Hydrochloride] Rash   Prior to Admission medications   Medication Sig Start Date End Date Taking? Authorizing Provider  allopurinol (ZYLOPRIM) 300 MG tablet Take 150 mg by mouth daily.    Yes Historical Provider, MD  aspirin EC 81 MG tablet Take 81 mg by mouth daily.   Yes Historical Provider, MD  b complex-vitamin c-folic acid (NEPHRO-VITE) 0.8 MG TABS Take 0.8 mg by mouth daily.   Yes Historical Provider, MD  fish oil-omega-3 fatty acids 1000 MG capsule Take  1 g by mouth daily.   Yes Historical Provider, MD  insulin lispro (HUMALOG) 100 UNIT/ML injection Inject 5 Units into the skin 3 (three) times a week.   Yes Historical Provider, MD  Multiple Vitamin (MULTIVITAMIN WITH MINERALS) TABS Take 1 tablet by mouth daily.   Yes Historical Provider, MD  vitamin C (ASCORBIC ACID) 500 MG tablet Take 500 mg by mouth daily.   Yes Historical Provider, MD   Current Facility-Administered Medications  Medication Dose Route Frequency Provider Last Rate Last Dose  . allopurinol (ZYLOPRIM) tablet 150 mg  150 mg Oral Daily Hillary Bow, DO       . aspirin EC tablet 81 mg  81 mg Oral Daily Hillary Bow, DO      . HYDROcodone-acetaminophen (NORCO/VICODIN) 5-325 MG per tablet 1-2 tablet  1-2 tablet Oral Q6H PRN Hillary Bow, DO   1 tablet at 10/01/13 0826  . insulin aspart (novoLOG) injection 0-9 Units  0-9 Units Subcutaneous Q4H Hillary Bow, DO   2 Units at 10/01/13 0800  . morphine 2 MG/ML injection 0.5 mg  0.5 mg Intravenous Q2H PRN Hillary Bow, DO      . multivitamin (RENA-VIT) tablet 1 tablet  1 tablet Oral QHS Hillary Bow, DO      . multivitamin with minerals tablet 1 tablet  1 tablet Oral Daily Hillary Bow, DO      . vitamin C (ASCORBIC ACID) tablet 500 mg  500 mg Oral Daily Hillary Bow, DO       Labs: Basic Metabolic Panel:  Recent Labs Lab 10/01/13 0305  NA 135  K 3.9  CL 92*  CO2 27  GLUCOSE 189*  BUN 23  CREATININE 3.84*  CALCIUM 9.8   CBC:  Recent Labs Lab 10/01/13 0305  WBC 18.9*  NEUTROABS 17.3*  HGB 9.7*  HCT 30.3*  MCV 93.2  PLT 275    CBG:  Recent Labs Lab 10/01/13 0406 10/01/13 0733  GLUCAP 193* 161*    Studies/Results: Dg Chest 1 View  10/01/2013   CLINICAL DATA:  Fall with no chest complaints.  EXAM: CHEST - 1 VIEW  COMPARISON:  07/02/2013.  FINDINGS: Cardiomegaly. Calcified tortuous aorta. Mild vascular congestion. Mild basilar atelectasis. Osseous demineralization. Dialysis catheter has been removed since the previous radiograph.  IMPRESSION: Cardiomegaly without active infiltrates or failure. Improved aeration from priors.   Electronically Signed   By: Davonna Belling M.D.   On: 10/01/2013 01:57   Dg Hip Complete Right  10/01/2013   *RADIOLOGY REPORT*  Clinical Data: Right knee pain, fall.  History of right hip surgery.  RIGHT HIP - COMPLETE 2+ VIEW  Comparison: None available at time of study interpretation.  Findings: Five images of the pelvis and right hip.  Status post right hip total arthroplasty, hardware appears intact well seated without  periprosthetic lucency.  No fracture deformity or dislocation.  Severe left hip joint space narrowing with mild marginal spurring.  Sacroiliac joints are symmetric.  Bone mineral density is decreased without destructive bony lesions.  Moderate vascular calcification.  Periarticular soft tissue planes are not suspicious.  IMPRESSION: No acute fracture deformity or dislocation.  Status post right hip total arthroplasty without radiographic findings of hardware failure.  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   Original Report Authenticated By: Awilda Metro   Dg Knee Complete 4 Views Right  10/01/2013   *RADIOLOGY REPORT*  Clinical Data: Fall, knee pain.  RIGHT KNEE - COMPLETE 4+ VIEW  Comparison: None available at time of study interpretation.  Findings: Comminuted distal femur metadiaphyseal impacted fracture with slight medial deviation of the distal bony fragments.  No definite interarticular extension.  No destructive bony lesions. Bone mineral density is decreased.  Moderate suprapatellar joint effusion.  Mild vascular calcifications.  IMPRESSION: Minimally displaced distal femur impacted metadiaphyseal fracture without dislocation.  Osteopenia.   Original Report Authenticated By: Pernell Dupre Bloomer    ROS: Rt leg throbbing and immobile. 10 pt ROS asked and answered. All other systems negative except as above   Physical Exam: Filed Vitals:   10/01/13 0500 10/01/13 0515 10/01/13 0600 10/01/13 0800  BP: 94/52 104/44 129/52   Pulse: 105 106 107   Temp:   98 F (36.7 C)   TempSrc:   Oral   Resp: 20 23  18   Height:   6\' 1"  (1.854 m)   Weight:   105.915 kg (233 lb 8 oz)   SpO2: 93% 92% 96% 95%     General: Elderly, pleasant, Obese, WM, in no acute distress. Head: Normocephalic, atraumatic, sclera non-icteric, mucus membranes are moist Neck: Supple. JVD not elevated. Lungs: Clear bilaterally to auscultation without wheezes, rales, or rhonchi. Breathing is unlabored. Heart: RRR with S1  S2. No murmurs, rubs, or gallops appreciated. Abdomen: Soft, obese, non-tender, non-distended with normoactive bowel sounds. No rebound/guarding. No obvious abdominal masses. M-S:  Strength and tone appear normal for age. Extremities:Rt knee with lateral swelling, decr ROM, non-wgt bearting. Trace LE edema (R > L) with brawny skin changes. Left elbow abrasion Neuro: Alert and oriented X 3. Moves all extremities spontaneously. Psych:  Responds to questions appropriately with a normal affect. Dialysis Access: LUA AVG + bruit  Dialysis Orders: [facility] . EDW 106 kg HD Bath 2K/2Ca  Time 4:00 Heparin 9000. Access LUA AVG BFR 450 DFR A 1.5    Hectorol 4 mcg IV/HD Epogen 3800   Units IV/HD  Venofer  0  Recent labs: Hgb 10.1, P 5.8, Tsat 22% Last Ferritin 347  Assessment/Plan: 1. Rt supracondylar femur fracture - Per ortho. To O.R this afternoon. No heparin with HD. 2. ESRD -  MWF, K+ 3.9, Next HD tomorrow 3. Hypertension/volume  - SBPs 120s. No BP meds. Mild vasc congestion on CXR. UF to EDW with HD tomorrow. 4. Anemia  - Hgb 9.7 < 10.1 on op Epo. Aranesp 60 q Wed ordered for here. Last Tsat 22%. Has since had 3-dose bolus of IV FE as op. Recheck iron studies and follow CBC post op. 5. Metabolic bone disease -  Ca 9.8. Last P 5.8 on Tums. Last PTH 500. Continue op hectorol 4, low Ca bath. Renal panel. May need a different binder at d/c. 6. Nutrition - NPO for now. High prot renal diet when appropriate. Multivitamin 7. DM - insulin per primary  [name] Kelp, PA-C [facility] Pager [phone] 10/01/2013, 10:17 AM   I have seen and examined this patient and agree with plan per [name].  72yo WM with ESRD on MWF at [facility] and admitted for fall and fx femur.  Pt to go to surgery today.  Had HD yest and will plan for HD in AM. Meds reviewed and aranesp and hectorol ordered. Azelie Noguera T,MD 10/01/2013 11:43 AM

## 2013-10-01 NOTE — ED Notes (Signed)
Pulses present by doppler in RLE

## 2013-10-01 NOTE — Anesthesia Preprocedure Evaluation (Addendum)
Anesthesia Evaluation  Patient identified by MRN, date of birth, ID band Patient awake    Reviewed: Allergy & Precautions, H&P , NPO status , Patient's Chart, lab work & pertinent test results  History of Anesthesia Complications Negative for: history of anesthetic complications  Airway Mallampati: II  Neck ROM: full    Dental  (+) Teeth Intact and Dental Advisory Given   Pulmonary COPDformer smoker,  breath sounds clear to auscultation  Pulmonary exam normal       Cardiovascular hypertension, Pt. on medications +CHF + dysrhythmias Atrial Fibrillation Rhythm:Regular Rate:Normal     Neuro/Psych    GI/Hepatic   Endo/Other  diabetes, Type 2, Insulin Dependent  Renal/GU ESRF and DialysisRenal diseaseMWF     Musculoskeletal   Abdominal   Peds  Hematology   Anesthesia Other Findings   Reproductive/Obstetrics                          Anesthesia Physical  Anesthesia Plan  ASA: III  Anesthesia Plan: General   Post-op Pain Management:    Induction: Intravenous  Airway Management Planned: LMA  Additional Equipment:   Intra-op Plan:   Post-operative Plan:   Informed Consent: I have reviewed the patients History and Physical, chart, labs and discussed the procedure including the risks, benefits and alternatives for the proposed anesthesia with the patient or authorized representative who has indicated his/her understanding and acceptance.   Dental advisory given  Plan Discussed with: CRNA, Anesthesiologist and Surgeon  Anesthesia Plan Comments: (R. Distal femur fracture ESRD  last HD 10/13 K-3.9 Type 2 DM glucose 142 Gout  Plan GA with oral ETT  Kipp Brood, MD)       Anesthesia Quick Evaluation

## 2013-10-01 NOTE — H&P (Signed)
[facility] History and Physical  [name]:[id number] DOB: 05-19-1941 DOA: 10/01/2013  Referring physician: ED PCP: Pearson Grippe, MD  Chief Complaint: R knee pain  HPI: [name] is a 72 y.o. male who presents after a mechanical fall with R knee pain.  Fall occurred immediately prior to arrival earlier this evening.  Patient forgot to put the breaks on his walker, and when he stood up walker went out in front of him and he landed directly on his knees.  He has been unable to bear weight on his R leg since then.  He was taken to the ED where distal femur fracture was diagnosed.  Review of Systems: 12 systems reviewed and otherwise negative.  Past Medical History  Diagnosis Date  . Diabetes mellitus   . Hypertension   . Atrial flutter   . Obesity   . COPD (chronic obstructive pulmonary disease)   . Gout   . CHF (congestive heart failure)   . Anemia   . Chronic kidney disease     Hemo M- W F.    Past Surgical History  Procedure Laterality Date  . Joint replacement      rt hip  . Arteriovenous graft placement  2008    left arm  . Av fistula placement Left 07/02/2013    Procedure: INSERTION OF ARTERIOVENOUS (AV) GORE-TEX GRAFT ARM;  Surgeon: Chuck Hint, MD;  Location: [facility] OR;  Service: Vascular;  Laterality: Left;  . Ligation arteriovenous gortex graft Left 07/02/2013    Procedure: LIGATION ARTERIOVENOUS GORTEX GRAFT;  Surgeon: Chuck Hint, MD;  Location: [facility] OR;  Service: Vascular;  Laterality: Left;  . Insertion of dialysis catheter N/A 07/02/2013    Procedure: INSERTION OF DIALYSIS CATHETER;  Surgeon: Chuck Hint, MD;  Location: [facility] OR;  Service: Vascular;  Laterality: N/A;  Left Internal Jugular Placement   Social History:  reports that he quit smoking about 28 years ago. His smoking use included Cigarettes. He smoked 0.00 packs per day. He does not have any smokeless tobacco history on file. He reports that he does not drink alcohol  or use illicit drugs.   Allergies  Allergen Reactions  . Penicillins Rash  . Sulfa Antibiotics Rash  . Glucophage [Metformin Hydrochloride] Rash    Family History  Problem Relation Age of Onset  . Heart attack Mother   . Heart attack Father   . Diabetes Sister   . Diabetes Brother     Prior to Admission medications   Medication Sig Start Date End Date Taking? Authorizing Provider  allopurinol (ZYLOPRIM) 300 MG tablet Take 150 mg by mouth daily.    Yes Historical Provider, MD  aspirin EC 81 MG tablet Take 81 mg by mouth daily.   Yes Historical Provider, MD  b complex-vitamin c-folic acid (NEPHRO-VITE) 0.8 MG TABS Take 0.8 mg by mouth daily.   Yes Historical Provider, MD  fish oil-omega-3 fatty acids 1000 MG capsule Take 1 g by mouth daily.   Yes Historical Provider, MD  insulin lispro (HUMALOG) 100 UNIT/ML injection Inject 5 Units into the skin 3 (three) times a week.   Yes Historical Provider, MD  Multiple Vitamin (MULTIVITAMIN WITH MINERALS) TABS Take 1 tablet by mouth daily.   Yes Historical Provider, MD  vitamin C (ASCORBIC ACID) 500 MG tablet Take 500 mg by mouth daily.   Yes Historical Provider, MD   Physical Exam: Filed Vitals:   10/01/13 0055  BP: 129/61  Pulse: 103  Temp:  97.4 F (36.3 C)  Resp: 21    General:  NAD, resting comfortably in bed Eyes: PEERLA EOMI ENT: mucous membranes moist Neck: supple w/o JVD Cardiovascular: RRR w/o MRG, no distal pulses palpable on either foot likely due to PAD chronically Respiratory: CTA B Abdomen: soft, nt, nd, bs+ Skin: no rash nor lesion Musculoskeletal: pain and hematoma at R knee Psychiatric: normal tone and affect Neurologic: AAOx3, grossly non-focal  Labs on Admission:  Basic Metabolic Panel: No results found for this basename: NA, K, CL, CO2, GLUCOSE, BUN, CREATININE, CALCIUM, MG, PHOS,  in the last 168 hours Liver Function Tests: No results found for this basename: AST, ALT, ALKPHOS, BILITOT, PROT, ALBUMIN,  in  the last 168 hours No results found for this basename: LIPASE, AMYLASE,  in the last 168 hours No results found for this basename: AMMONIA,  in the last 168 hours CBC: No results found for this basename: WBC, NEUTROABS, HGB, HCT, MCV, PLT,  in the last 168 hours Cardiac Enzymes: No results found for this basename: CKTOTAL, CKMB, CKMBINDEX, TROPONINI,  in the last 168 hours  BNP (last 3 results) No results found for this basename: PROBNP,  in the last 8760 hours CBG: No results found for this basename: GLUCAP,  in the last 168 hours  Radiological Exams on Admission: Dg Chest 1 View  10/01/2013   CLINICAL DATA:  Fall with no chest complaints.  EXAM: CHEST - 1 VIEW  COMPARISON:  07/02/2013.  FINDINGS: Cardiomegaly. Calcified tortuous aorta. Mild vascular congestion. Mild basilar atelectasis. Osseous demineralization. Dialysis catheter has been removed since the previous radiograph.  IMPRESSION: Cardiomegaly without active infiltrates or failure. Improved aeration from priors.   Electronically Signed   By: Davonna Belling M.D.   On: 10/01/2013 01:57   Dg Hip Complete Right  10/01/2013   *RADIOLOGY REPORT*  Clinical Data: Right knee pain, fall.  History of right hip surgery.  RIGHT HIP - COMPLETE 2+ VIEW  Comparison: None available at time of study interpretation.  Findings: Five images of the pelvis and right hip.  Status post right hip total arthroplasty, hardware appears intact well seated without periprosthetic lucency.  No fracture deformity or dislocation.  Severe left hip joint space narrowing with mild marginal spurring.  Sacroiliac joints are symmetric.  Bone mineral density is decreased without destructive bony lesions.  Moderate vascular calcification.  Periarticular soft tissue planes are not suspicious.  IMPRESSION: No acute fracture deformity or dislocation.  Status post right hip total arthroplasty without radiographic findings of hardware failure.  Moderate to severe left hip  osteoarthrosis.   Original Report Authenticated By: Awilda Metro   Dg Knee Complete 4 Views Right  10/01/2013   *RADIOLOGY REPORT*  Clinical Data: Fall, knee pain.  RIGHT KNEE - COMPLETE 4+ VIEW  Comparison: None available at time of study interpretation.  Findings: Comminuted distal femur metadiaphyseal impacted fracture with slight medial deviation of the distal bony fragments.  No definite interarticular extension.  No destructive bony lesions. Bone mineral density is decreased.  Moderate suprapatellar joint effusion.  Mild vascular calcifications.  IMPRESSION: Minimally displaced distal femur impacted metadiaphyseal fracture without dislocation.  Osteopenia.   Original Report Authenticated By: Awilda Metro    EKG: Independently reviewed.  Assessment/Plan Principal Problem:   Fracture of femur, distal, right, closed Active Problems:   End stage renal disease   IDDM (insulin dependent diabetes mellitus)   1. Fracture of R distal femur - Admitting patient, getting the remainder of his pre-op work up  including EKG.  Plan is for OR tomorrow afternoon.  On femur fracture pathway. 2. ESRD - dialysis MWF, next dialysis Wed. 3. IDDM - putting patient on low dose SSI Q4H while NPO.    Code Status: Full Code (must indicate code status--if unknown or must be presumed, indicate so) Family Communication: Spoke with family at bedside (indicate person spoken with, if applicable, with phone number if by telephone) Disposition Plan: Admit to inpatient (indicate anticipated LOS)  Time spent: 70 min  Tera Pellicane M. [facility] Pager [phone]  If 7PM-7AM, please contact night-coverage [email or web address] Password [email or web address] 10/01/2013, 3:12 AM

## 2013-10-01 NOTE — Transfer of Care (Signed)
Immediate Anesthesia Transfer of Care Note  Patient: [name]  Procedure(s) Performed: Procedure(s): OPEN REDUCTION INTERNAL FIXATION (ORIF) DISTAL FEMUR FRACTURE (Right)  Patient Location: PACU  Anesthesia Type:General  Level of Consciousness: awake, alert , oriented and patient cooperative  Airway & Oxygen Therapy: Patient Spontanous Breathing and Patient connected to nasal cannula oxygen  Post-op Assessment: Report given to PACU RN and Post -op Vital signs reviewed and stable  Post vital signs: Reviewed and stable  Complications: No apparent anesthesia complications

## 2013-10-01 NOTE — Anesthesia Procedure Notes (Signed)
Procedure Name: Intubation Date/Time: 10/01/2013 5:48 PM Performed by: Hillary Bow. Pre-anesthesia Checklist: Patient identified, Timeout performed, Emergency Drugs available, Suction available and Patient being monitored Patient Re-evaluated:Patient Re-evaluated prior to inductionOxygen Delivery Method: Circle system utilized Preoxygenation: Pre-oxygenation with 100% oxygen Intubation Type: IV induction Ventilation: Mask ventilation without difficulty Laryngoscope Size: Mac and 3 Grade View: Grade I Tube type: Oral Tube size: 7.5 mm Number of attempts: 1 Airway Equipment and Method: Stylet Placement Confirmation: ETT inserted through vocal cords under direct vision,  positive ETCO2 and breath sounds checked- equal and bilateral Secured at: 22 cm Tube secured with: Tape Dental Injury: Teeth and Oropharynx as per pre-operative assessment
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
## 2013-10-02 DIAGNOSIS — N058 Unspecified nephritic syndrome with other morphologic changes: Secondary | ICD-10-CM

## 2013-10-02 DIAGNOSIS — Z8781 Personal history of (healed) traumatic fracture: Secondary | ICD-10-CM

## 2013-10-02 DIAGNOSIS — N186 End stage renal disease: Secondary | ICD-10-CM | POA: Diagnosis present

## 2013-10-02 DIAGNOSIS — Z9889 Other specified postprocedural states: Secondary | ICD-10-CM

## 2013-10-02 DIAGNOSIS — I10 Essential (primary) hypertension: Secondary | ICD-10-CM

## 2013-10-02 DIAGNOSIS — E1129 Type 2 diabetes mellitus with other diabetic kidney complication: Secondary | ICD-10-CM

## 2013-10-02 LAB — COMPREHENSIVE METABOLIC PANEL
Albumin: 2.8 g/dL — ABNORMAL LOW (ref 3.5–5.2)
Alkaline Phosphatase: 123 U/L — ABNORMAL HIGH (ref 39–117)
BUN: 50 mg/dL — ABNORMAL HIGH (ref 6–23)
CO2: 28 mEq/L (ref 19–32)
Chloride: 89 mEq/L — ABNORMAL LOW (ref 96–112)
GFR calc Af Amer: 8 mL/min — ABNORMAL LOW (ref >90)
GFR calc non Af Amer: 7 mL/min — ABNORMAL LOW (ref >90)
Glucose, Bld: 216 mg/dL — ABNORMAL HIGH (ref 70–99)
Potassium: 4 mEq/L (ref 3.5–5.1)
Sodium: 131 mEq/L — ABNORMAL LOW (ref 135–145)
Total Bilirubin: 0.7 mg/dL (ref 0.3–1.2)
Total Protein: 7.2 g/dL (ref 6.0–8.3)

## 2013-10-02 LAB — GLUCOSE, CAPILLARY
Glucose-Capillary: 141 mg/dL — ABNORMAL HIGH (ref 70–99)
Glucose-Capillary: 154 mg/dL — ABNORMAL HIGH (ref 70–99)
Glucose-Capillary: 171 mg/dL — ABNORMAL HIGH (ref 70–99)
Glucose-Capillary: 188 mg/dL — ABNORMAL HIGH (ref 70–99)
Glucose-Capillary: 199 mg/dL — ABNORMAL HIGH (ref 70–99)

## 2013-10-02 LAB — CBC
HCT: 30.2 % — ABNORMAL LOW (ref 39.0–52.0)
MCH: 32.5 pg (ref 26.0–34.0)
MCHC: 35.4 g/dL (ref 30.0–36.0)
MCV: 91.8 fL (ref 78.0–100.0)
Platelets: 93 10*3/uL — ABNORMAL LOW (ref 150–400)
RDW: 14.2 % (ref 11.5–15.5)
WBC: 6.2 10*3/uL (ref 4.0–10.5)

## 2013-10-02 LAB — IRON AND TIBC
Iron: 43 ug/dL (ref 42–135)
Saturation Ratios: 23 % (ref 20–55)
UIBC: 147 ug/dL (ref 125–400)

## 2013-10-02 MED ORDER — DOXERCALCIFEROL 4 MCG/2ML IV SOLN
INTRAVENOUS | Status: AC
  Administered 2013-10-02: 4 ug via INTRAVENOUS
  Filled 2013-10-02: qty 2

## 2013-10-02 MED ORDER — DARBEPOETIN ALFA-POLYSORBATE 60 MCG/0.3ML IJ SOLN
INTRAMUSCULAR | Status: AC
  Administered 2013-10-02: 60 ug via INTRAVENOUS
  Filled 2013-10-02: qty 0.3

## 2013-10-02 MED ORDER — HYDROCODONE-ACETAMINOPHEN 5-325 MG PO TABS: 1.0000 | ORAL_TABLET | ORAL | Status: DC | PRN

## 2013-10-02 MED ORDER — INSULIN ASPART 100 UNIT/ML ~~LOC~~ SOLN: 0.0000 [IU] | Freq: Every day | SUBCUTANEOUS | Status: DC

## 2013-10-02 MED ORDER — INSULIN ASPART 100 UNIT/ML ~~LOC~~ SOLN
0.0000 [IU] | Freq: Three times a day (TID) | SUBCUTANEOUS | Status: DC
  Administered 2013-10-02: 2 [IU] via SUBCUTANEOUS
  Administered 2013-10-02: 1 [IU] via SUBCUTANEOUS
  Administered 2013-10-03 (×2): 2 [IU] via SUBCUTANEOUS
  Administered 2013-10-03: 1 [IU] via SUBCUTANEOUS
  Administered 2013-10-04: 2 [IU] via SUBCUTANEOUS
  Administered 2013-10-04: 1 [IU] via SUBCUTANEOUS

## 2013-10-02 NOTE — Procedures (Signed)
Pt seen on HD.  SP ORIF Rt distal femur fracture yest wo complication.  Pain controlled.  SBP in the 80's but he denies any Sx of low BP.  Will keep even.  AP 110 Vp 130.

## 2013-10-02 NOTE — Progress Notes (Addendum)
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 X-rays showed a right closed distal femur fracture. Orthopedics and nephrology were consulted. Patient underwent ORIF on 10/14. Awaiting SNF.  Assessment/Plan:  S/P ORIF of Closed right distal femoral fracture - Orthopedics consultation and followup appreciated. Discussed with Dr. Ave Filter on 10/15 - Nonweightbearing RLE - Aspirin 325 mg by mouth twice a day for one month post surgery: DVT prophylaxis as per Orthopedics. - OP follow up with orthopedics in 2 weeks - SNF at discharge for rehabilitation.  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 - Getting HD on 10/15  Type II DM with nephropathy - Reasonable inpatient control. - Continue SSI.  Hypertension - Hypotensive today. SBP in the 80s. Asymptomatic - Not on antihypertensives. - Even fluid balance across HD.  Anemia secondary to ESRD - Hemoglobin stable. - Management per nephrology. - Continue Aranesp  Thrombocytopenia - Unclear etiology. - Follow CBC In a.m.  History of atrial flutter - Clinically In sinus rhythm. - Not on anticoagulation PTA. Was on aspirin 81 mg daily - Currently on postop DVT prophylaxis with aspirin 325 mg twice a day for one month. This can be transitioned to home dose aspirin after completion of one month.  History of gout - Asymptomatic. - Continue allopurinol   DVT prophylaxis: Aspirin 325 mg twice a day Lines/catheters: PIV Nutrition: Renal diet  Activity:  Nonweightbearing right lower extremity Code Status: Full Family Communication: Left message for  spouse. Disposition Plan: SNF, when bed available   Consultants:  Orthopedics  Nephrology  Procedures:  ORIF of closed right distal femoral fracture 10/14  Hemodialysis  Antibiotics:  Clindamycin  Subjective: Minimal pain it operated site. Denies any other complaints.  Objective: Filed Vitals:   10/02/13 0930 10/02/13 1000 10/02/13 1030 10/02/13 1045  BP: 88/39 89/47 80/43  85/47  Pulse: 96 95 92 92  Temp:      TempSrc:      Resp: 20 24 20    Height:      Weight:      SpO2: 96% 99% 99%     Intake/Output Summary (Last 24 hours) at 10/02/13 1058 Last data filed at 10/02/13 0300  Gross per 24 hour  Intake    980 ml  Output     30 ml  Net    950 ml   Filed Weights   10/01/13 0600 10/01/13 2058 10/02/13 0824  Weight: 105.915 kg (233 lb 8 oz) 106.765 kg (235 lb 6 oz) 107.1 kg (236 lb 1.8 oz)     Exam:  General exam: Moderately built and nourished male, lying comfortably supine in bed. Respiratory system: Clear. No increased work of breathing. Cardiovascular system: S1 & S2 heard, RRR. No JVD, murmurs, gallops, clicks or pedal edema. Gastrointestinal system: Abdomen is nondistended, soft and nontender. Normal bowel sounds heard. Central nervous system: Alert and oriented. No focal neurological deficits. Extremities: Symmetric 5 x 5 power. AV fistula with thrill left upper arm. RLE surgical site dressing-clean, dry and intact.   Data Reviewed: Basic Metabolic Panel:  Recent Labs Lab 10/01/13 0305 10/02/13 0500  NA 135 131*  K 3.9 4.0  CL 92* 89*  CO2 27 28  GLUCOSE  189* 216*  BUN 23 50*  CREATININE 3.84* 7.08*  CALCIUM 9.8 8.7   Liver Function Tests:  Recent Labs Lab 10/02/13 0500  AST 26  ALT 6  ALKPHOS 123*  BILITOT 0.7  PROT 7.2  ALBUMIN 2.8*   No results found for this basename: LIPASE, AMYLASE,  in the last 168 hours No results found for this basename: AMMONIA,  in the last 168 hours CBC:  Recent Labs Lab 10/01/13 0305  10/02/13 0500  WBC 18.9* 6.2  NEUTROABS 17.3*  --   HGB 9.7* 10.7*  HCT 30.3* 30.2*  MCV 93.2 91.8  PLT 275 93*   Cardiac Enzymes: No results found for this basename: CKTOTAL, CKMB, CKMBINDEX, TROPONINI,  in the last 168 hours BNP (last 3 results) No results found for this basename: PROBNP,  in the last 8760 hours CBG:  Recent Labs Lab 10/01/13 1640 10/01/13 2058 10/01/13 2355 10/02/13 0434 10/02/13 0741  GLUCAP 155* 172* 171* 141* 154*    Recent Results (from the past 240 hour(s))  SURGICAL PCR SCREEN     Status: None   Collection Time    10/01/13 12:54 PM      Result Value Range Status   MRSA, PCR NEGATIVE  NEGATIVE Final   Staphylococcus aureus NEGATIVE  NEGATIVE Final   Comment:            The Xpert SA Assay (FDA     approved for NASAL specimens     in patients over 71 years of age),     is one component of     a comprehensive surveillance     program.  Test performance has     been validated by [employer] for patients greater     than or equal to 85 year old.     It is not intended     to diagnose infection nor to     guide or monitor treatment.      Additional labs: 1. None     Studies: Dg Chest 1 View  10/01/2013   CLINICAL DATA:  Fall with no chest complaints.  EXAM: CHEST - 1 VIEW  COMPARISON:  07/02/2013.  FINDINGS: Cardiomegaly. Calcified tortuous aorta. Mild vascular congestion. Mild basilar atelectasis. Osseous demineralization. Dialysis catheter has been removed since the previous radiograph.  IMPRESSION: Cardiomegaly without active infiltrates or failure. Improved aeration from priors.   Electronically Signed   By: Davonna Belling M.D.   On: 10/01/2013 01:57   Dg Hip Complete Right  10/01/2013   *RADIOLOGY REPORT*  Clinical Data: Right knee pain, fall.  History of right hip surgery.  RIGHT HIP - COMPLETE 2+ VIEW  Comparison: None available at time of study interpretation.  Findings: Five images of the pelvis and right hip.  Status post  right hip total arthroplasty, hardware appears intact well seated without periprosthetic lucency.  No fracture deformity or dislocation.  Severe left hip joint space narrowing with mild marginal spurring.  Sacroiliac joints are symmetric.  Bone mineral density is decreased without destructive bony lesions.  Moderate vascular calcification.  Periarticular soft tissue planes are not suspicious.  IMPRESSION: No acute fracture deformity or dislocation.  Status post right hip total arthroplasty without radiographic findings of hardware failure.  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   Original Report Authenticated By: Awilda Metro   Dg Femur Right  10/01/2013   CLINICAL DATA:  Femur fracture.  EXAM: RIGHT FEMUR - 2 VIEW; DG C-ARM 1-60 MIN  COMPARISON:  10/01/2013  FINDINGS: Intraoperative images of the right femur were obtained. A lateral femoral plate has been placed. There are proximal and distal screws. Again noted is a displaced fracture of the distal femur.  IMPRESSION: Internal fixation of the displaced distal femur fracture.   Electronically Signed   By: Richarda Overlie M.D.   On: 10/01/2013 19:16   Dg Femur Right Port  10/01/2013   CLINICAL DATA:  Status post fracture fixation.  EXAM: PORTABLE RIGHT FEMUR - 2 VIEW  COMPARISON:  Plain films earlier this same day.  FINDINGS: Plate and screws are in place for fixation of a distal femur fracture. Hardware is intact. Position and alignment are anatomic. No new abnormality is identified. Surgical staples are noted.  IMPRESSION: ORIF distal right femur fracture without acute abnormality.   Electronically Signed   By: Drusilla Kanner M.D.   On: 10/01/2013 20:45   Dg Knee Complete 4 Views Right  10/01/2013   *RADIOLOGY REPORT*  Clinical Data: Fall, knee pain.  RIGHT KNEE - COMPLETE 4+ VIEW  Comparison: None available at time of study interpretation.  Findings: Comminuted distal femur metadiaphyseal impacted fracture with slight medial deviation of the  distal bony fragments.  No definite interarticular extension.  No destructive bony lesions. Bone mineral density is decreased.  Moderate suprapatellar joint effusion.  Mild vascular calcifications.  IMPRESSION: Minimally displaced distal femur impacted metadiaphyseal fracture without dislocation.  Osteopenia.   Original Report Authenticated By: Awilda Metro   Dg C-arm 1-60 Min  10/01/2013   CLINICAL DATA:  Femur fracture.  EXAM: RIGHT FEMUR - 2 VIEW; DG C-ARM 1-60 MIN  COMPARISON:  10/01/2013  FINDINGS: Intraoperative images of the right femur were obtained. A lateral femoral plate has been placed. There are proximal and distal screws. Again noted is a displaced fracture of the distal femur.  IMPRESSION: Internal fixation of the displaced distal femur fracture.   Electronically Signed   By: Richarda Overlie M.D.   On: 10/01/2013 19:16        Scheduled Meds: . allopurinol  150 mg Oral Daily  . aspirin EC  325 mg Oral BID  . calcium carbonate  2 tablet Oral TID WC  . clindamycin (CLEOCIN) IV  600 mg Intravenous Q6H  . darbepoetin (ARANESP) injection - DIALYSIS  60 mcg Intravenous Q Wed-HD  . doxercalciferol  4 mcg Intravenous Q M,W,F-HD  . insulin aspart  0-9 Units Subcutaneous Q4H  . multivitamin  1 tablet Oral QHS  . multivitamin with minerals  1 tablet Oral Daily  . vitamin C  500 mg Oral Daily   Continuous Infusions: . sodium chloride      Principal Problem:   Fracture of femur, distal, right, closed Active Problems:   End stage renal disease   IDDM (insulin dependent diabetes mellitus)   Unspecified essential hypertension    Time spent: 35 minutes    Amaliya Whitelaw, MD, FACP, FHM. [facility] Pager [phone]  If 7PM-7AM, please contact night-coverage [email or web address] Password [email or web address] 10/02/2013, 10:58 AM    LOS: 1 day

## 2013-10-02 NOTE — Progress Notes (Signed)
PATIENT ID: [name]   1 Day Post-Op Procedure(s) (LRB): OPEN REDUCTION INTERNAL FIXATION (ORIF) DISTAL FEMUR FRACTURE (Right)  Subjective: Reports he is feeling well today, some pain in right LE, just worked with PT. No other complaints or concerns.  Objective:  Filed Vitals:   10/02/13 0842  BP: 83/44  Pulse: 98  Temp:   Resp:     Awake, alert, orientated R LE dressing/ace c/d/i, knee immobilizer repositioned Distally nvi, wiggles toes  Labs:   Recent Labs  10/01/13 0305  HGB 9.7*   Recent Labs  10/01/13 0305  WBC 18.9*  RBC 3.25*  HCT 30.3*  PLT 275   Recent Labs  10/01/13 0305  NA 135  K 3.9  CL 92*  CO2 27  BUN 23  CREATININE 3.84*  GLUCOSE 189*  CALCIUM 9.8    Assessment and Plan: 1 day s/p ORIF distal femur fracture Continue current pain mgmt Continue NWB right LE, continue knee immoblizer Continue with PT on passive rom right knee will likely go to SNF as rec by PT, will consult OT for evaluation Will change dressing tomorrow or friday Anticipate percocet for home pain control, ASA 325mg  BID x 4 weeks for DVT prophylaxis if okay with nephrologist   VTE proph: SCDs, ASA 325mg  BID x 4 weeks

## 2013-10-02 NOTE — Progress Notes (Signed)
Pt has been feeling weak, with no energy, pt states, "I just don't feel good," since returning from HD. Most recent vitals showed T 99.2, O2 Sat 83% on room air. I placed the pt on 2L O2, O2 Sat rose to 93%. MD notified. Will continue to monitor.

## 2013-10-02 NOTE — Clinical Social Work Note (Signed)
CSW talked with patient and wife regarding short-term rehab at discharge and they are in agreement (full assessment to follow). Clinicals sent to facilities in [facility] and CSW will follow-up with patient/wife with facility responses on 10/16.  Genelle Bal, MSW, LCSW [phone]

## 2013-10-02 NOTE — Evaluation (Signed)
Physical Therapy Evaluation Patient Details Name: [name] MRN: [id number] DOB: [birth year] Today's Date: 10/02/2013 Time: 4098-1191 PT Time Calculation (min): 20 min  PT Assessment / Plan / Recommendation History of Present Illness   pt is a 72 y.o. Male with PMH that includes diabetes mellitus, HTN, obesity, COPD, CHF, CKD with av fistula for hemodialysis.   Clinical Impression  Patient is s/p ORIF Rt femur surgery due to mechanical fall resulting in functional limitations due to the deficits listed below (see PT Problem List). Patient will benefit from skilled PT to increase their independence and safety with mobility to allow discharge to the venue listed below.      PT Assessment  Patient needs continued PT services    Follow Up Recommendations  SNF;Supervision/Assistance - 24 hour    Does the patient have the potential to tolerate intense rehabilitation      Barriers to Discharge Decreased caregiver support;Inaccessible home environment      Equipment Recommendations  Other (comment) (TBD at SNF)    Recommendations for Other Services OT consult   Frequency Min 3X/week    Precautions / Restrictions Precautions Precautions: Fall Required Braces or Orthoses: Knee Immobilizer - Right Knee Immobilizer - Right: On when out of bed or walking Restrictions Weight Bearing Restrictions: Yes RLE Weight Bearing: Non weight bearing Other Position/Activity Restrictions: pt is allowed full PROM   Pertinent Vitals/Pain "mild pain, not too bad"       Mobility  Bed Mobility Bed Mobility: Supine to Sit;Sitting - Scoot to Edge of Bed;Sit to Supine Supine to Sit: 4: Min guard;HOB elevated;With rails Sitting - Scoot to Edge of Bed: 5: Supervision;With rail Sit to Supine: 4: Min assist;HOB flat;With rail Details for Bed Mobility Assistance: pt able to independently advance Rt LE off EOB; relies heavily on HOB being elevated and handrails for bed mobility; to return to supine pt  requires min (A) to advance Rt LE onto bed; pt with difficulty with hip flexion to perform sit to supine from EOB  Transfers Transfers: Sit to Stand;Stand to Sit Sit to Stand: Other (comment);From bed;From elevated surface (pt unable to complete with total (A) of 1 ) Stand to Sit: 2: Max assist;To bed;To elevated surface;With upper extremity assist Details for Transfer Assistance: pt unable to fully complete sit to stand from EOB; attempted 3x with total (A) of 1; pt with difficulty maintaining NWB status on Rt LE and becomes anxious with attempted transfer; pt would benefit from 2 person (A) for transfers; max cues for hand placement and sequencing with RW  Ambulation/Gait Ambulation/Gait Assistance: Not tested (comment) Stairs: No Wheelchair Mobility Wheelchair Mobility: No    Exercises General Exercises - Lower Extremity Ankle Circles/Pumps: AROM;Both;10 reps;Supine;Other (comment) (limited due to arthritis )   PT Diagnosis: Difficulty walking;Acute pain  PT Problem List: Decreased strength;Decreased range of motion;Decreased activity tolerance;Decreased balance;Decreased mobility;Decreased knowledge of use of DME;Decreased safety awareness;Decreased knowledge of precautions;Pain PT Treatment Interventions: DME instruction;Gait training;Therapeutic activities;Functional mobility training;Therapeutic exercise;Balance training;Neuromuscular re-education;Patient/family education;Wheelchair mobility training     PT Goals(Current goals can be found in the care plan section) Acute Rehab PT Goals Patient Stated Goal: to get where i can move more PT Goal Formulation: With patient Time For Goal Achievement: 10/09/13 Potential to Achieve Goals: Good  Visit Information  Last PT Received On: 10/02/13 Assistance Needed: +2 (for transfers )       Prior Functioning  Home Living Family/patient expects to be discharged to:: Private residence Living Arrangements: Spouse/significant  other Available  Help at Discharge: Family;Available 24 hours/day Type of Home: House Home Access: Stairs to enter [employer] of Steps: 5 Entrance Stairs-Rails: Right Home Layout: One level Home Equipment: Walker - 4 wheels;Shower seat;Bedside commode Additional Comments: has tub shower with seat Prior Function Level of Independence: Needs assistance Gait / Transfers Assistance Needed: pt reports he is independent with gt and transfers to bathroom at home with rollator  ADL's / Homemaking Assistance Needed: wife helped pt wash his back and back of legs as well as dress his lower half  Communication Communication: No difficulties Dominant Hand: Right    Cognition  Cognition Arousal/Alertness: Awake/alert Behavior During Therapy: WFL for tasks assessed/performed Overall Cognitive Status: Within Functional Limits for tasks assessed    Extremity/Trunk Assessment Upper Extremity Assessment Upper Extremity Assessment: Defer to OT evaluation Lower Extremity Assessment Lower Extremity Assessment: RLE deficits/detail RLE: Unable to fully assess due to pain RLE Sensation:  (WFL to light touch) Cervical / Trunk Assessment Cervical / Trunk Assessment: Kyphotic   Balance Balance Balance Assessed: Yes Static Sitting Balance Static Sitting - Balance Support: No upper extremity supported;Feet unsupported (Lt LE supported) Static Sitting - Level of Assistance: 5: Stand by assistance Static Sitting - Comment/# of Minutes: ~62min   End of Session PT - End of Session Equipment Utilized During Treatment: Gait belt;Right knee immobilizer Activity Tolerance: Patient tolerated treatment well Patient left: in bed;with call bell/phone within reach;with nursing/sitter in room Nurse Communication: Mobility status  GP     Donell Sievert, [place] [phone] 10/02/2013, 8:26 AM

## 2013-10-03 ENCOUNTER — Inpatient Hospital Stay (HOSPITAL_COMMUNITY): Payer: Medicare Other

## 2013-10-03 ENCOUNTER — Encounter (HOSPITAL_COMMUNITY): Payer: Self-pay | Admitting: Orthopedic Surgery

## 2013-10-03 DIAGNOSIS — D62 Acute posthemorrhagic anemia: Secondary | ICD-10-CM

## 2013-10-03 LAB — CBC
HCT: 24.2 % — ABNORMAL LOW (ref 39.0–52.0)
HCT: 24.4 % — ABNORMAL LOW (ref 39.0–52.0)
Hemoglobin: 7.7 g/dL — ABNORMAL LOW (ref 13.0–17.0)
MCH: 29.6 pg (ref 26.0–34.0)
MCH: 30.2 pg (ref 26.0–34.0)
MCHC: 31 g/dL (ref 30.0–36.0)
MCV: 95.7 fL (ref 78.0–100.0)
Platelets: 222 10*3/uL (ref 150–400)
Platelets: 214 10*3/uL (ref 150–400)
RBC: 2.53 MIL/uL — ABNORMAL LOW (ref 4.22–5.81)
RBC: 2.55 MIL/uL — ABNORMAL LOW (ref 4.22–5.81)
RDW: 18.6 % — ABNORMAL HIGH (ref 11.5–15.5)
WBC: 12.1 10*3/uL — ABNORMAL HIGH (ref 4.0–10.5)
WBC: 12.2 10*3/uL — ABNORMAL HIGH (ref 4.0–10.5)

## 2013-10-03 LAB — COMPREHENSIVE METABOLIC PANEL
AST: 28 U/L (ref 0–37)
Albumin: 3.1 g/dL — ABNORMAL LOW (ref 3.5–5.2)
Alkaline Phosphatase: 117 U/L (ref 39–117)
BUN: 20 mg/dL (ref 6–23)
Chloride: 94 mEq/L — ABNORMAL LOW (ref 96–112)
GFR calc Af Amer: 16 mL/min — ABNORMAL LOW (ref >90)
GFR calc non Af Amer: 14 mL/min — ABNORMAL LOW (ref >90)
Glucose, Bld: 186 mg/dL — ABNORMAL HIGH (ref 70–99)
Potassium: 4.7 mEq/L (ref 3.5–5.1)
Total Bilirubin: 0.3 mg/dL (ref 0.3–1.2)
Total Protein: 6.8 g/dL (ref 6.0–8.3)

## 2013-10-03 LAB — GLUCOSE, CAPILLARY
Glucose-Capillary: 186 mg/dL — ABNORMAL HIGH (ref 70–99)
Glucose-Capillary: 177 mg/dL — ABNORMAL HIGH (ref 70–99)

## 2013-10-03 LAB — PHOSPHORUS: Phosphorus: 4.7 mg/dL — ABNORMAL HIGH (ref 2.3–4.6)

## 2013-10-03 MED ORDER — DARBEPOETIN ALFA-POLYSORBATE 200 MCG/0.4ML IJ SOLN: 200.0000 ug | INTRAMUSCULAR | Status: DC

## 2013-10-03 NOTE — Evaluation (Signed)
Occupational Therapy Evaluation Patient Details Name: [name] MRN: [id number] DOB: 1941-04-08 Today's Date: 10/03/2013 Time: 5784-6962 OT Time Calculation (min): 34 min  OT Assessment / Plan / Recommendation History of present illness Pt admitted with distal R femur fx.  Underwent ORIF and is non weight bearing on the R LE.  Pt with hx of DM, obesity, COPD, CHF, and is on HD with a mwf schedule.   Clinical Impression   Pt requires +2 assist for OOB activity and is dependent in all toileting and LB ADL.  Highly motivated to return to PLOF.  Pt is agreeable to SNF for ST rehab.    OT Assessment  Patient needs continued OT Services    Follow Up Recommendations  SNF    Barriers to Discharge      Equipment Recommendations  None recommended by OT    Recommendations for Other Services    Frequency  Min 2X/week    Precautions / Restrictions Precautions Precautions: Fall Required Braces or Orthoses: Knee Immobilizer - Right Knee Immobilizer - Right: On when out of bed or walking Restrictions Weight Bearing Restrictions: Yes RLE Weight Bearing: Non weight bearing Other Position/Activity Restrictions: pt is allowed full PROM   Pertinent Vitals/Pain On 3 L 02, instructed in purse lip breathing, no pain reported    ADL  Eating/Feeding: Set up Where Assessed - Eating/Feeding: Bed level Grooming: Wash/dry hands;Wash/dry face;Teeth care;Set up Where Assessed - Grooming: Supported sitting Upper Body Bathing: Minimal assistance Where Assessed - Upper Body Bathing: Unsupported sitting Lower Body Bathing: +1 Total assistance Where Assessed - Lower Body Bathing: Unsupported sitting;Supported sit to stand Upper Body Dressing: Set up Where Assessed - Upper Body Dressing: Unsupported sitting Lower Body Dressing: +1 Total assistance Where Assessed - Lower Body Dressing: Supported sitting;Supported sit to stand Toilet Transfer: +2 Total assistance Toilet Transfer: Patient  Percentage: 30% Toilet Transfer Method: Stand pivot Equipment Used: Gait belt;Rolling walker;Knee Immobilizer Transfers/Ambulation Related to ADLs: +2 pt 30% to transfer to L side    OT Diagnosis: Generalized weakness  OT Problem List: Decreased strength;Decreased activity tolerance;Impaired balance (sitting and/or standing);Decreased knowledge of use of DME or AE;Decreased knowledge of precautions;Obesity;Impaired UE functional use OT Treatment Interventions: Self-care/ADL training;DME and/or AE instruction;Therapeutic activities;Patient/family education;Balance training   OT Goals(Current goals can be found in the care plan section) Acute Rehab OT Goals Patient Stated Goal: to get moving again  OT Goal Formulation: With patient Time For Goal Achievement: 10/17/13 Potential to Achieve Goals: Good ADL Goals Pt Will Transfer to Toilet: stand pivot transfer;bedside commode;with mod assist Pt/caregiver will Perform Home Exercise Program: Both right and left upper extremity;With theraband;Independently Additional ADL Goal #1: Pt will adhere to NWB on R LE with ADL transfer with minimal verbal cues.  Visit Information  Last OT Received On: 10/03/13 Assistance Needed: +2 PT/OT Co-Evaluation/Treatment: Yes History of Present Illness: Pt admitted with distal R femur fx.  Underwent ORIF and is non weight bearing on the R LE.  Pt with hx of DM, obesity, COPD, CHF, and is on HD with a mwf schedule.       Prior Functioning     Home Living Family/patient expects to be discharged to:: Skilled nursing facility Prior Function Level of Independence: Needs assistance Gait / Transfers Assistance Needed: pt reports he is independent with gt and transfers to bathroom at home with rollator  ADL's / Homemaking Assistance Needed: wife helped pt wash his back and back of legs as well as dress his lower half  Communication Communication: No difficulties Dominant Hand: Right          Vision/Perception Vision - History Baseline Vision: Wears glasses all the time   Cognition  Cognition Arousal/Alertness: Awake/alert Behavior During Therapy: WFL for tasks assessed/performed Overall Cognitive Status: Within Functional Limits for tasks assessed    Extremity/Trunk Assessment Upper Extremity Assessment Upper Extremity Assessment: RUE deficits/detail;LUE deficits/detail RUE Deficits / Details: arthritic changes in hand RUE Coordination: decreased fine motor (drops things a lot) LUE Deficits / Details: arthritic changes in hand LUE Coordination: decreased fine motor (drops things a lot) Lower Extremity Assessment Lower Extremity Assessment: Defer to PT evaluation Cervical / Trunk Assessment Cervical / Trunk Assessment: Kyphotic     Mobility Bed Mobility Bed Mobility: Supine to Sit;Sitting - Scoot to Edge of Bed Supine to Sit: 5: Supervision;HOB elevated;With rails Sitting - Scoot to Edge of Bed: 5: Supervision;With rail Sit to Supine: 4: Min assist;HOB flat;With rail Details for Bed Mobility Assistance: supervision for safety; pt relies on handrails and HOB to be elvated; no physical (A) needed at this time  Transfers Transfers: Sit to Stand;Stand to Sit Sit to Stand: 1: +2 Total assist;From bed;From elevated surface;With upper extremity assist Sit to Stand: Patient Percentage: 40% Stand to Sit: 1: +2 Total assist;To chair/3-in-1;With armrests;With upper extremity assist Stand to Sit: Patient Percentage: 40% Details for Transfer Assistance: performed sit to stand x3; pt has difficulty extending Lt LE and maintaining balance due to weakness in Lt LE; pt requires max cues for hand placement and sequencing; has difficulty maintaining NWB status through Rt LE; pt able to perform SPT without RW and with 2 person (A) while blocking Lt LE to prevent buckling; pt very anxious with transfers      Exercise    Balance Balance Balance Assessed: Yes Static Sitting  Balance Static Sitting - Balance Support: No upper extremity supported;Feet unsupported (Lt LE supported) Static Sitting - Level of Assistance: 5: Stand by assistance Static Sitting - Comment/# of Minutes: tolerated sitting EOB to rest between transfers    End of Session OT - End of Session Patient left: in chair;with call bell/phone within reach Nurse Communication: Mobility status  GO     Evern Bio 10/03/2013, 10:30 AM [phone]

## 2013-10-03 NOTE — Progress Notes (Signed)
PATIENT ID: Celene Kras T Waidelich   2 Days Post-Op Procedure(s) (LRB): OPEN REDUCTION INTERNAL FIXATION (ORIF) DISTAL FEMUR FRACTURE (Right)  Subjective: No complaints.  Denies pain.  Up in chair. Denies CP/SOB/Dizziness.   Objective:  Filed Vitals:   10/03/13 0958  BP: 88/53  Pulse: 87  Temp: 98.2 F (36.8 C)  Resp: 18     Seated in NAD. Dressing C/D/I  Labs:   Recent Labs  10/01/13 0305 10/02/13 0500 10/03/13 0410 10/03/13 0944  HGB 9.7* 10.7* 7.5* 7.7*   Recent Labs  10/03/13 0410 10/03/13 0944  WBC 12.1* 12.2*  RBC 2.53* 2.55*  HCT 24.2* 24.4*  PLT 222 214   Recent Labs  10/02/13 0500 10/03/13 0410  NA 131* 136  K 4.0 4.7  CL 89* 94*  CO2 28 32  BUN 50* 20  CREATININE 7.08* 3.88*  GLUCOSE 216* 186*  CALCIUM 8.7 9.9    Assessment and Plan:  POD 2 ORIF distal femur fx ABLA: agree with primary team to transfuse during HD NWB RLE with PT, ok for gentle knee ROM. D/c to SNF after transfusion in HD tomorrow Will change dressing in AM F/u 2 wks.  VTE proph: [facility]AS[facility] BID with SCDs while in hosp.

## 2013-10-03 NOTE — Clinical Social Work Placement (Addendum)
Clinical Social Work Department CLINICAL SOCIAL WORK PLACEMENT NOTE 10/03/2013  Patient:  [name], [name]  Account Number:  [id number] Admit date:  10/01/2013  Clinical Social Worker:  Genelle Bal, LCSW  Date/time:  10/03/2013 12:00 M  Clinical Social Work is seeking post-discharge placement for this patient at the following level of care:   SKILLED NURSING   (*CSW will update this form in Epic as items are completed)   10/02/2013  Patient/family provided with [facility] Department of Clinical Social Work's list of facilities offering this level of care within the geographic area requested by the patient (or if unable, by the patient's family).  10/02/2013  Patient/family informed of their freedom to choose among providers that offer the needed level of care, that participate in Medicare, Medicaid or managed care program needed by the patient, have an available bed and are willing to accept the patient.    Patient/family informed of [facility]' ownership interest in [facility], as well as of the fact that they are under no obligation to receive care at this facility.  PASARR submitted to EDS on 10/03/2013 PASARR number received from EDS on 10/03/2013  FL2 transmitted to all facilities in geographic area requested by pt/family on  10/02/2013 FL2 transmitted to all facilities within larger geographic area on   Patient informed that his/her managed care company has contracts with or will negotiate with  certain facilities, including the following:     Patient/family informed of bed offers received:  10/03/2013 Patient chooses bed at [facility] [place] Physician recommends and patient chooses bed at    Patient to be transferred to [facility] [place] on 10/04/13  Patient to be transferred to facility by ambulance  The following physician request were entered in Epic:   Additional Comments: 10/03/13 - Clinicals sent to [facility]. 10/03/13 - Per MD, patient not medically stable for discharge today. Facility contacted and patient/wife advised. 10/04/13 - Wife at the bedside and aware of discharge

## 2013-10-03 NOTE — Clinical Social Work Psychosocial (Signed)
Clinical Social Work Department BRIEF PSYCHOSOCIAL ASSESSMENT 10/03/2013  Patient:  SOSTENES, KAUFFMANN     Account Number:  [id number]     Admit date:  10/01/2013  Clinical Social Worker:  Delmer Islam  Date/Time:  10/03/2013 10:04 AM  Referred by:  Physician  Date Referred:  10/02/2013 Referred for  SNF Placement   Other Referral:   Interview type:  Patient Other interview type:   CSW also spoke with patient's wife Lytle Malburg at the bedside    PSYCHOSOCIAL DATA Living Status:  WIFE Admitted from facility:   Level of care:   Primary support name:  Kayla Weekes Primary support relationship to patient:  SPOUSE Degree of support available:   Wife concerned and involved with patient's care    CURRENT CONCERNS Current Concerns  Post-Acute Placement   Other Concerns:    SOCIAL WORK ASSESSMENT / PLAN On 10/02/13 CSW spoke with patient and wife regarding MD and therapists recommendation of ST rehab. Patient was alert and oriented and able to engage in conversation with CSW. The rehab process was explained and questions answered. CSW also explained SNF search process and provided Mrs. Loken with a list of skilled nursing facilities for [facility]. Patient and wife in agreement with ST rehab.   Assessment/plan status:  Psychosocial Support/Ongoing Assessment of Needs Other assessment/ plan:   Information/referral to community resources:   Wife given SNF list for [facility] on 10/02/13    PATIENT'S/FAMILY'S RESPONSE TO PLAN OF CARE: Patient and wife receptive to speaking with CSW concerning discharge planning. CSW advised that

## 2013-10-03 NOTE — Progress Notes (Signed)
Physical Therapy Treatment Patient Details Name: [name] MRN: [id number] DOB: 1941/06/02 Today's Date: 10/03/2013 Time: 7829-5621 PT Time Calculation (min): 32 min  PT Assessment / Plan / Recommendation  History of Present Illness Pt admitted with distal R femur fx.  Underwent ORIF and is non weight bearing on the R LE.  Pt with hx of DM, obesity, COPD, CHF, and is on HD with a mwf schedule.   PT Comments   Pt very motivated to participate in therapy and increase activity. Pt has difficulty with sit <> stand transfers due to decr strength in Lt LE and difficulty maintaining NWB status on Rt. Pt is awaiting bed placement for ST SNF. Will cont to f/u with pt while in acute setting to maximize independence with transfers and strengthen Lt LE.  Follow Up Recommendations  SNF;Supervision/Assistance - 24 hour     Does the patient have the potential to tolerate intense rehabilitation     Barriers to Discharge        Equipment Recommendations  Other (comment)    Recommendations for Other Services    Frequency Min 3X/week   Progress towards PT Goals Progress towards PT goals: Progressing toward goals  Plan Current plan remains appropriate    Precautions / Restrictions Precautions Precautions: Fall Required Braces or Orthoses: Knee Immobilizer - Right Knee Immobilizer - Right: On when out of bed or walking Restrictions Weight Bearing Restrictions: Yes RLE Weight Bearing: Non weight bearing Other Position/Activity Restrictions: pt is allowed full PROM   Pertinent Vitals/Pain Denies pain; pt on 3L O2; cues for controlled deep breathing throughout session    Mobility  Bed Mobility Bed Mobility: Supine to Sit;Sitting - Scoot to Edge of Bed Supine to Sit: 5: Supervision;HOB elevated;With rails Sitting - Scoot to Edge of Bed: 5: Supervision;With rail Details for Bed Mobility Assistance: supervision for safety; pt relies on handrails and HOB to be elvated; no physical (A) needed  at this time  Transfers Transfers: Sit to Stand;Stand to Sit;Stand Pivot Transfers Sit to Stand: 1: +2 Total assist;From bed;From elevated surface;With upper extremity assist Sit to Stand: Patient Percentage: 40% Stand to Sit: 1: +2 Total assist;To chair/3-in-1;With armrests;With upper extremity assist Stand to Sit: Patient Percentage: 40% Stand Pivot Transfers: 1: +2 Total assist;From elevated surface;With armrests Stand Pivot Transfers: Patient Percentage: 30% Details for Transfer Assistance: performed sit to stand x3; pt has difficulty extending Lt LE and maintaining balance due to weakness in Lt LE; pt requires max cues for hand placement and sequencing; has difficulty maintaining NWB status through Rt LE; pt able to perform SPT without RW and with 2 person (A) while blocking Lt LE to prevent buckling; pt very anxious with transfers  Ambulation/Gait Ambulation/Gait Assistance: Not tested (comment) Stairs: No Wheelchair Mobility Wheelchair Mobility: No    Exercises General Exercises - Lower Extremity Ankle Circles/Pumps: AROM;Both;10 reps;Supine Short Arc Quad: AROM;Left;10 reps;Seated (to increase quad strength for transfers ) Long Arc Quad: AROM;Left;10 reps;Seated Heel Slides: PROM;Right;10 reps;Supine;Other (comment);Left;AROM (no c/o pain; AROM on Lt PROM on Rt) Hip ABduction/ADduction: AROM;Left;10 reps;Seated;Other (comment) (cues to control exercises and slow speed) Pt able to teach back exercises to perform independently    PT Diagnosis:    PT Problem List:   PT Treatment Interventions:     PT Goals (current goals can now be found in the care plan section) Acute Rehab PT Goals Patient Stated Goal: to get moving again  PT Goal Formulation: With patient Time For Goal Achievement: 10/09/13 Potential to Achieve  Goals: Good  Visit Information  Last PT Received On: 10/03/13 Assistance Needed: +2 PT/OT Co-Evaluation/Treatment: Yes History of Present Illness: Pt admitted  with distal R femur fx.  Underwent ORIF and is non weight bearing on the R LE.  Pt with hx of DM, obesity, COPD, CHF, and is on HD with a mwf schedule.    Subjective Data  Subjective: pt lying supine; agreeable and motivated for therapy Patient Stated Goal: to get moving again    Cognition  Cognition Arousal/Alertness: Awake/alert Behavior During Therapy: WFL for tasks assessed/performed Overall Cognitive Status: Within Functional Limits for tasks assessed    Balance  Balance Balance Assessed: Yes Static Sitting Balance Static Sitting - Balance Support: No upper extremity supported;Feet unsupported (Lt LE supported) Static Sitting - Level of Assistance: 5: Stand by assistance Static Sitting - Comment/# of Minutes: tolerated sitting EOB to rest between transfers   End of Session PT - End of Session Equipment Utilized During Treatment: Gait belt;Right knee immobilizer Activity Tolerance: Patient tolerated treatment well Patient left: in chair;with call bell/phone within reach Nurse Communication: Mobility status;Weight bearing status   GP     [name], [place] [phone] 10/03/2013, 10:23 AM

## 2013-10-03 NOTE — Progress Notes (Signed)
SATURATION QUALIFICATIONS: (This note is used to comply with regulatory documentation for home oxygen)  Patient Saturations on Room Air at Rest = 85-86%  Patient Saturations on Room Air while Ambulating = 85%  Patient Saturations on  2Liters of oxygen while Ambulating = 90  Please briefly explain why patient needs home oxygen:Patient at room air while resting/sitting on bed dropped to 85-86%.

## 2013-10-03 NOTE — Progress Notes (Signed)
Subjective:  No complaints, denies pain, no dyspnea  Objective: Vital signs in last 24 hours: Temp:  [98.2 F (36.8 C)-99.2 F (37.3 C)] 98.4 F (36.9 C) (10/16 0436) Pulse Rate:  [91-104] 95 (10/16 0436) Resp:  [16-28] 20 (10/16 0436) BP: (76-128)/(35-72) 119/54 mmHg (10/16 0436) SpO2:  [83 %-100 %] 95 % (10/16 0436) Weight:  [107.1 kg (236 lb 1.8 oz)-109.3 kg (240 lb 15.4 oz)] 109.3 kg (240 lb 15.4 oz) (10/15 2012) Weight change: 0.335 kg (11.8 oz)  Intake/Output from previous day: 10/15 0701 - 10/16 0700 In: 120 [P.O.:120] Out: 0  Intake/Output this shift: Total I/O In: 120 [P.O.:120] Out: -  EXAM: General appearance:  Alert,  In no apparent distress  Resp:  CTA without rales, rhonchi, or wheezes Cardio:  RRR without murmur or rub GI:  + BS, soft and nontender Extremities:  R knee immobilizer with trace pretibial edema, no edema on L Access:  AVG @ LUA with + bruit  Lab Results:  Recent Labs  10/01/13 0305 10/02/13 0500  WBC 18.9* 6.2  HGB 9.7* 10.7*  HCT 30.3* 30.2*  PLT 275 93*   BMET:  Recent Labs  10/01/13 0305 10/02/13 0500  NA 135 131*  K 3.9 4.0  CL 92* 89*  CO2 27 28  GLUCOSE 189* 216*  BUN 23 50*  CREATININE 3.84* 7.08*  CALCIUM 9.8 8.7  ALBUMIN  --  2.8*   No results found for this basename: PTH,  in the last 72 hours Iron Studies:  Recent Labs  10/02/13 0942  IRON 43  TIBC 190*  FERRITIN 478*   D[facility] Orders: [facility] .  EDW 106 kg HD Bath 2K/2Ca Time 4:00 Heparin 9000. Access LUA AVG BFR 450 DFR A 1.5  Hectorol 4 mcg IV/HD Epogen 3800 Units IV/HD Venofer 0   Assessment/Plan: 1. R supracondylar femur fracture - s/p ORIF per Dr. Ave Filter 10/14; stable, discharge to SNF for rehab. 2. ESRD - HD on MWF @ [place]; K 4.7.  Next HD tomorrow. 3. HTN/Volume - BP 119/54, no meds; wt 109.3 kg post-HD yesterday, even UF, wearing immobilzer, no signs or symptoms of fluid overload. 4. Anemia - Hgb down to 7.5 post-surgery, Aranesp 60  mcg on Wed; last T-sat 22% followed by 3-dose bolus of IV Fe.  Fe studies pending, increase Aranesp, likely transfusion tomorrow with HD. 5. 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 6. Nutrition - Alb 3.1, high protein renal diet, multivitamin. 7. DM Type 2 - insulin per primary.    LOS: 2 days   LYLES,CHARLES 10/03/2013,6:43 AM I have seen and examined this patient and agree with plan per Gerome Apley.  Pain controlled and not requiring pain meds.  Up in chair using incentive spirometer!!.  Plan HD in AM. Zana Biancardi T,MD 10/03/2013 10:16 AM

## 2013-10-03 NOTE — Progress Notes (Signed)
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 X-rays showed a right closed distal femur fracture. Orthopedics and nephrology were consulted. Patient underwent ORIF on 10/14. Awaiting SNF.  Assessment/Plan:  S/P ORIF of Closed right distal femoral fracture - Orthopedics consultation and followup appreciated. Discussed with Dr. Ave Filter on 10/15 - Nonweightbearing RLE - Aspirin 325 mg by mouth twice a day for one month post surgery: DVT prophylaxis as per Orthopedics. - OP follow up with orthopedics in 2 weeks - SNF at discharge for rehabilitation.  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 - Getting HD on 10/17  Type II DM with nephropathy - Reasonable inpatient control. - Continue SSI.  Hypertension - Hypotensive today. SBP in the 80s. Asymptomatic - Not on antihypertensives. - Even fluid balance across HD.  Acute blood loss anemia from surgery/Anemia secondary to ESRD - Hemoglobin dropped from 10.7 > 7.5 > 7.7 - Continue Aranesp - Follow CBC in a.m. and transfuse if hemoglobin less than 7 g per DL  Thrombocytopenia - Unclear etiology. - Resolved  History of atrial flutter - Clinically In sinus rhythm. - Not on anticoagulation PTA. Was on aspirin 81 mg daily - Currently on postop DVT prophylaxis with aspirin 325 mg twice a day for one month. This can be transitioned to home dose aspirin after completion of one month.  History of gout - Asymptomatic. - Continue allopurinol  Hypoxia/atelectasis - Patient denies any complaints i.e. dyspnea, chest pain or palpitations. - Does not look  clinically volume overloaded. - Chest x-ray without acute changes but has low inspiratory volumes & atelectasis. - Aggressive use of incentive spirometry and follow O2 sats. May need to be discharged on home oxygen temporarily.  DVT prophylaxis: Aspirin 325 mg twice a day Lines/catheters: PIV Nutrition: Renal diet  Activity:  Nonweightbearing right lower extremity Code Status: Full Family Communication: Left message for spouse. Disposition Plan: SNF, possibly 10/17 after dialysis   Consultants:  Orthopedics  Nephrology  Procedures:  ORIF of closed right distal femoral fracture 10/14  Hemodialysis  Antibiotics:  Clindamycin-completed  Subjective: Minimal pain it operated site. Denies any other complaints-specifically denies dyspnea, chest pain, palpitations, dizziness, lightheadedness or weakness.  Objective: Filed Vitals:   10/02/13 2012 10/03/13 0436 10/03/13 0958 10/03/13 1418  BP: 106/51 119/54 88/53 149/71  Pulse: 95 95 87 86  Temp: 98.2 F (36.8 C) 98.4 F (36.9 C) 98.2 F (36.8 C) 98.2 F (36.8 C)  TempSrc: Oral Oral Oral Oral  Resp: 22 20 18 18   Height:      Weight: 109.3 kg (240 lb 15.4 oz)     SpO2: 95% 95% 95% 97%    Intake/Output Summary (Last 24 hours) at 10/03/13 1549 Last data filed at 10/03/13 1300  Gross per 24 hour  Intake    600 ml  Output      0 ml  Net    600 ml   Filed Weights   10/01/13 2058 10/02/13 0824 10/02/13 2012  Weight: 106.765 kg (235 lb 6 oz) 107.1 kg (236 lb 1.8 oz) 109.3 kg (240 lb 15.4 oz)     Exam:  General exam: Moderately built and nourished male, lying comfortably supine in bed.  Respiratory system: Clear. No increased work of breathing. Cardiovascular system: S1 & S2 heard, RRR. No JVD, murmurs, gallops, clicks or pedal edema. Gastrointestinal system: Abdomen is nondistended, soft and nontender. Normal bowel sounds heard. Central nervous system: Alert and oriented. No focal neurological deficits. Extremities:  Symmetric 5 x 5 power. AV fistula with thrill left upper arm. RLE surgical site dressing-clean, dry and intact.   Data Reviewed: Basic Metabolic Panel:  Recent Labs Lab 10/01/13 0305 10/02/13 0500 10/03/13 0410 10/03/13 0944  NA 135 131* 136  --   K 3.9 4.0 4.7  --   CL 92* 89* 94*  --   CO2 27 28 32  --   GLUCOSE 189* 216* 186*  --   BUN 23 50* 20  --   CREATININE 3.84* 7.08* 3.88*  --   CALCIUM 9.8 8.7 9.9  --   PHOS  --   --   --  4.7*   Liver Function Tests:  Recent Labs Lab 10/02/13 0500 10/03/13 0410  AST 26 28  ALT 6 21  ALKPHOS 123* 117  BILITOT 0.7 0.3  PROT 7.2 6.8  ALBUMIN 2.8* 3.1*   No results found for this basename: LIPASE, AMYLASE,  in the last 168 hours No results found for this basename: AMMONIA,  in the last 168 hours CBC:  Recent Labs Lab 10/01/13 0305 10/02/13 0500 10/03/13 0410 10/03/13 0944  WBC 18.9* 6.2 12.1* 12.2*  NEUTROABS 17.3*  --   --   --   HGB 9.7* 10.7* 7.5* 7.7*  HCT 30.3* 30.2* 24.2* 24.4*  MCV 93.2 91.8 95.7 95.7  PLT 275 93* 222 214   Cardiac Enzymes: No results found for this basename: CKTOTAL, CKMB, CKMBINDEX, TROPONINI,  in the last 168 hours BNP (last 3 results) No results found for this basename: PROBNP,  in the last 8760 hours CBG:  Recent Labs Lab 10/02/13 1342 10/02/13 1638 10/02/13 2010 10/03/13 0748 10/03/13 1154  GLUCAP 125* 199* 188* 180* 166*    Recent Results (from the past 240 hour(s))  SURGICAL PCR SCREEN     Status: None   Collection Time    10/01/13 12:54 PM      Result Value Range Status   MRSA, PCR NEGATIVE  NEGATIVE Final   Staphylococcus aureus NEGATIVE  NEGATIVE Final   Comment:            The Xpert SA Assay (FDA     approved for NASAL specimens     in patients over 17 years of age),     is one component of     a comprehensive surveillance     program.  Test performance has     been validated by [employer] for patients greater     than or equal to 22 year old.     It  is not intended     to diagnose infection nor to     guide or monitor treatment.      Additional labs: 1. None     Studies: Dg Chest 2 View  10/03/2013   CLINICAL DATA:  Hypoxia, short of breath  EXAM: CHEST  2 VIEW  COMPARISON:  Most recent prior chest x-ray 10/01/2013  FINDINGS: Stable mild cardiomegaly. Mediastinal contours are unchanged. Atherosclerotic calcification noted in the transverse aorta. Inspiratory volumes are slightly lower. There is persistent of horizontal atelectasis versus scarring in the bilateral bases. Similar degree of pulmonary vascular congestion without overt edema. No pleural effusion  or pneumothorax. Metallic stents project over the heart. No acute osseous abnormality.  IMPRESSION: Slightly lower inspiratory volumes. Otherwise, no acute cardiopulmonary process.  Stable vascular congestion without overt edema and bibasilar atelectasis versus scarring.   Electronically Signed   By: Malachy Moan M.D.   On: 10/03/2013 13:35   Dg Femur Right  10/01/2013   CLINICAL DATA:  Femur fracture.  EXAM: RIGHT FEMUR - 2 VIEW; DG C-ARM 1-60 MIN  COMPARISON:  10/01/2013  FINDINGS: Intraoperative images of the right femur were obtained. A lateral femoral plate has been placed. There are proximal and distal screws. Again noted is a displaced fracture of the distal femur.  IMPRESSION: Internal fixation of the displaced distal femur fracture.   Electronically Signed   By: Richarda Overlie M.D.   On: 10/01/2013 19:16   Dg Femur Right Port  10/01/2013   CLINICAL DATA:  Status post fracture fixation.  EXAM: PORTABLE RIGHT FEMUR - 2 VIEW  COMPARISON:  Plain films earlier this same day.  FINDINGS: Plate and screws are in place for fixation of a distal femur fracture. Hardware is intact. Position and alignment are anatomic. No new abnormality is identified. Surgical staples are noted.  IMPRESSION: ORIF distal right femur fracture without acute abnormality.   Electronically Signed   By: Drusilla Kanner M.D.   On: 10/01/2013 20:45   Dg C-arm 1-60 Min  10/01/2013   CLINICAL DATA:  Femur fracture.  EXAM: RIGHT FEMUR - 2 VIEW; DG C-ARM 1-60 MIN  COMPARISON:  10/01/2013  FINDINGS: Intraoperative images of the right femur were obtained. A lateral femoral plate has been placed. There are proximal and distal screws. Again noted is a displaced fracture of the distal femur.  IMPRESSION: Internal fixation of the displaced distal femur fracture.   Electronically Signed   By: Richarda Overlie M.D.   On: 10/01/2013 19:16        Scheduled Meds: . allopurinol  150 mg Oral Daily  . aspirin EC  325 mg Oral BID  . calcium carbonate  2 tablet Oral TID WC  . [START ON 10/09/2013] darbepoetin (ARANESP) injection - DIALYSIS  200 mcg Intravenous Q Wed-HD  . doxercalciferol  4 mcg Intravenous Q M,W,F-HD  . insulin aspart  0-5 Units Subcutaneous QHS  . insulin aspart  0-9 Units Subcutaneous TID WC  . multivitamin  1 tablet Oral QHS  . multivitamin with minerals  1 tablet Oral Daily  . vitamin C  500 mg Oral Daily   Continuous Infusions:    Principal Problem:   Fracture of femur, distal, right, closed Active Problems:   IDDM (insulin dependent diabetes mellitus)   Unspecified essential hypertension   ESRD on dialysis   DM (diabetes mellitus) type II uncontrolled with renal manifestation   S/P ORIF (open reduction internal fixation) fracture-right distal femoral fracture    Time spent: 25 minutes    Kimmy Parish, MD, FACP, FHM. [facility] Pager [phone]  If 7PM-7AM, please contact night-coverage [email or web address] Password [email or web address] 10/03/2013, 3:49 PM    LOS: 2 days

## 2013-10-04 LAB — RENAL FUNCTION PANEL
BUN: 37 mg/dL — ABNORMAL HIGH (ref 6–23)
Chloride: 91 mEq/L — ABNORMAL LOW (ref 96–112)
GFR calc Af Amer: 9 mL/min — ABNORMAL LOW (ref >90)
Glucose, Bld: 184 mg/dL — ABNORMAL HIGH (ref 70–99)
Potassium: 4.3 mEq/L (ref 3.5–5.1)
Sodium: 132 mEq/L — ABNORMAL LOW (ref 135–145)

## 2013-10-04 LAB — IRON AND TIBC
Iron: 29 ug/dL — ABNORMAL LOW (ref 42–135)
TIBC: 195 ug/dL — ABNORMAL LOW (ref 215–435)
UIBC: 166 ug/dL (ref 125–400)

## 2013-10-04 LAB — COMPREHENSIVE METABOLIC PANEL
AST: 54 U/L — ABNORMAL HIGH (ref 0–37)
Albumin: 2.8 g/dL — ABNORMAL LOW (ref 3.5–5.2)
Alkaline Phosphatase: 119 U/L — ABNORMAL HIGH (ref 39–117)
BUN: 37 mg/dL — ABNORMAL HIGH (ref 6–23)
Chloride: 92 mEq/L — ABNORMAL LOW (ref 96–112)
Creatinine, Ser: 6.03 mg/dL — ABNORMAL HIGH (ref 0.50–1.35)
GFR calc Af Amer: 10 mL/min — ABNORMAL LOW (ref >90)
Glucose, Bld: 177 mg/dL — ABNORMAL HIGH (ref 70–99)
Sodium: 132 mEq/L — ABNORMAL LOW (ref 135–145)
Total Bilirubin: 0.3 mg/dL (ref 0.3–1.2)
Total Protein: 6.8 g/dL (ref 6.0–8.3)

## 2013-10-04 LAB — CBC
HCT: 22.5 % — ABNORMAL LOW (ref 39.0–52.0)
Hemoglobin: 7.2 g/dL — ABNORMAL LOW (ref 13.0–17.0)
MCH: 31.1 pg (ref 26.0–34.0)
MCHC: 32 g/dL (ref 30.0–36.0)
MCHC: 33.7 g/dL (ref 30.0–36.0)
MCV: 94.1 fL (ref 78.0–100.0)
Platelets: 187 10*3/uL (ref 150–400)
RBC: 2.39 MIL/uL — ABNORMAL LOW (ref 4.22–5.81)
RBC: 2.93 MIL/uL — ABNORMAL LOW (ref 4.22–5.81)
RDW: 17.3 % — ABNORMAL HIGH (ref 11.5–15.5)
WBC: 12.6 10*3/uL — ABNORMAL HIGH (ref 4.0–10.5)
WBC: 12.1 10*3/uL — ABNORMAL HIGH (ref 4.0–10.5)

## 2013-10-04 LAB — FERRITIN: Ferritin: 897 ng/mL — ABNORMAL HIGH (ref 22–322)

## 2013-10-04 LAB — GLUCOSE, CAPILLARY
Glucose-Capillary: 238 mg/dL — ABNORMAL HIGH (ref 70–99)
Glucose-Capillary: 131 mg/dL — ABNORMAL HIGH (ref 70–99)

## 2013-10-04 LAB — PREPARE RBC (CROSSMATCH)

## 2013-10-04 MED ORDER — INSULIN ASPART 100 UNIT/ML ~~LOC~~ SOLN: SUBCUTANEOUS | Status: DC

## 2013-10-04 MED ORDER — SODIUM CHLORIDE 0.9 % IV SOLN: 100.0000 mL | INTRAVENOUS | Status: DC | PRN

## 2013-10-04 MED ORDER — PENTAFLUOROPROP-TETRAFLUOROETH EX AERO: 1.0000 "application " | INHALATION_SPRAY | CUTANEOUS | Status: DC | PRN

## 2013-10-04 MED ORDER — ALTEPLASE 2 MG IJ SOLR
2.0000 mg | Freq: Once | INTRAMUSCULAR | Status: DC | PRN
  Filled 2013-10-04: qty 2

## 2013-10-04 MED ORDER — LIDOCAINE-PRILOCAINE 2.5-2.5 % EX CREA
1.0000 "application " | TOPICAL_CREAM | CUTANEOUS | Status: DC | PRN
  Filled 2013-10-04: qty 5

## 2013-10-04 MED ORDER — DOXERCALCIFEROL 4 MCG/2ML IV SOLN
INTRAVENOUS | Status: AC
  Filled 2013-10-04: qty 2

## 2013-10-04 MED ORDER — SODIUM CHLORIDE 0.9 % IV SOLN
125.0000 mg | INTRAVENOUS | Status: DC
  Administered 2013-10-04: 125 mg via INTRAVENOUS
  Filled 2013-10-04: qty 10

## 2013-10-04 MED ORDER — LIDOCAINE HCL (PF) 1 % IJ SOLN: 5.0000 mL | INTRAMUSCULAR | Status: DC | PRN

## 2013-10-04 MED ORDER — HEPARIN SODIUM (PORCINE) 1000 UNIT/ML DIALYSIS: 1000.0000 [IU] | INTRAMUSCULAR | Status: DC | PRN

## 2013-10-04 MED ORDER — NEPRO/CARBSTEADY PO LIQD
237.0000 mL | ORAL | Status: DC | PRN
  Filled 2013-10-04: qty 237

## 2013-10-04 MED ORDER — ASPIRIN 325 MG PO TBEC: 325.0000 mg | DELAYED_RELEASE_TABLET | Freq: Two times a day (BID) | ORAL | Status: DC

## 2013-10-04 MED ORDER — HYDROCODONE-ACETAMINOPHEN 5-325 MG PO TABS: 1.0000 | ORAL_TABLET | Freq: Three times a day (TID) | ORAL | Status: DC | PRN

## 2013-10-04 NOTE — Progress Notes (Signed)
[facility] Progress Note  Subjective:   Doing well post-op. Denies pain. Anxious to get out of bed and work on strengthening the unaffected leg.  Objective Filed Vitals:   10/03/13 1800 10/03/13 2209 10/04/13 0532 10/04/13 0758  BP: 111/51 138/72 120/65 123/65  Pulse: 76 84 90 80  Temp: 98.2 F (36.8 C) 98.4 F (36.9 C) 98.1 F (36.7 C) 98 F (36.7 C)  TempSrc: Oral Oral Oral Oral  Resp: 16 20 16 16   Height:      Weight:  109.634 kg (241 lb 11.2 oz)  109.6 kg (241 lb 10 oz)  SpO2: 97% 98% 95% 98%   Physical Exam General: Alert, cooperative, NAD Heart: RRR, no murmur or rub appreciated Lungs: CTA bilat, no wheezes, rales or rhonchi Abdomen: Obese, soft, NT, normal BS Extremities: RLE with ace wrap and immobilizer, trace edema and brawny skin changes bilat Dialysis Access: LUA AVG patent  D[facility] Orders: [facility] .  EDW 106 kg HD Bath 2K/2Ca Time 4:00 Heparin 9000. Access LUA AVG BFR 450 DFR A 1.5  Hectorol 4 mcg IV/HD Epogen 3800 Units IV/HD Venofer 0   Assessment/Plan: 1. R supracondylar femur fracture - s/p ORIF per Dr. Ave Filter 10/14; stable. ASA 325 mg BID x 1 month for DVT prophylaxis. Discharge to SNF for rehab. 2. ESRD - HD on MWF @ [place]; HD today 3. HTN/Volume - SBPs 120s, no meds; wt 109.6 kg with immobilizer in place. No overt signs of volume excess. UF 2L as tolerated.  4. Anemia - Hgb 7.7 post-op, Got Aranesp 60 on 10/15, Max dose for next administration. Tsat now 23% with low ferritin. Repeat CBC pending for today. Will transfuse 2 units PRBCs with HD for HGB < 8.  Start IV Fe load x 6 doses. 5. Sec HPT - Ca and Phos controlled. Continue Hectorol 4 mcg, Tums with meals. Renal panel pending. 6. Nutrition - High protein renal diet, multivitamin. 7. DM Type 2 - insulin per primary.  8. Dispo - Discharge to [facility] for rehab when medically stable  Scot Jun. Broadus John, PA-C [facility] Pager [phone] 10/04/2013,8:34  AM  LOS: 3 days  I have seen and examined this patient and agree with plan per Claud Kelp.  Pt seen on HD.  AP 190 Vp 190. BFR 450. Note plans to DC to SNF . Merly Hinkson T,MD 10/04/2013 9:09 AM  Additional Objective Labs: Basic Metabolic Panel:  Recent Labs Lab 10/01/13 0305 10/02/13 0500 10/03/13 0410 10/03/13 0944  NA 135 131* 136  --   K 3.9 4.0 4.7  --   CL 92* 89* 94*  --   CO2 27 28 32  --   GLUCOSE 189* 216* 186*  --   BUN 23 50* 20  --   CREATININE 3.84* 7.08* 3.88*  --   CALCIUM 9.8 8.7 9.9  --   PHOS  --   --   --  4.7*   Liver Function Tests:  Recent Labs Lab 10/02/13 0500 10/03/13 0410  AST 26 28  ALT 6 21  ALKPHOS 123* 117  BILITOT 0.7 0.3  PROT 7.2 6.8  ALBUMIN 2.8* 3.1*   CBC:  Recent Labs Lab 10/01/13 0305 10/02/13 0500 10/03/13 0410 10/03/13 0944  WBC 18.9* 6.2 12.1* 12.2*  NEUTROABS 17.3*  --   --   --   HGB 9.7* 10.7* 7.5* 7.7*  HCT 30.3* 30.2* 24.2* 24.4*  MCV 93.2 91.8 95.7 95.7  PLT 275 93* 222  214   Blood Culture    Component Value Date/Time   SDES STOOL 10/22/2007 0136   SPECREQUEST  IMMUNE:NORM 10/22/2007 0136   CULT KLEBSIELLA OXYTOCA 10/20/2007 1250   REPTSTATUS 10/23/2007 FINAL 10/22/2007 0136    CBG:  Recent Labs Lab 10/03/13 0748 10/03/13 1154 10/03/13 1723 10/03/13 2205 10/04/13 0739  GLUCAP 180* 166* 186* 177* 238*   Iron Studies:  Recent Labs  10/02/13 0942  IRON 43  TIBC 190*  FERRITIN 478*   Studies/Results: Dg Chest 2 View  10/03/2013   CLINICAL DATA:  Hypoxia, short of breath  EXAM: CHEST  2 VIEW  COMPARISON:  Most recent prior chest x-ray 10/01/2013  FINDINGS: Stable mild cardiomegaly. Mediastinal contours are unchanged. Atherosclerotic calcification noted in the transverse aorta. Inspiratory volumes are slightly lower. There is persistent of horizontal atelectasis versus scarring in the bilateral bases. Similar degree of pulmonary vascular congestion without overt edema. No pleural effusion  or pneumothorax. Metallic stents project over the heart. No acute osseous abnormality.  IMPRESSION: Slightly lower inspiratory volumes. Otherwise, no acute cardiopulmonary process.  Stable vascular congestion without overt edema and bibasilar atelectasis versus scarring.   Electronically Signed   By: Malachy Moan M.D.   On: 10/03/2013 13:35   Medications:   . allopurinol  150 mg Oral Daily  . aspirin EC  325 mg Oral BID  . calcium carbonate  2 tablet Oral TID WC  . [START ON 10/09/2013] darbepoetin (ARANESP) injection - DIALYSIS  200 mcg Intravenous Q Wed-HD  . doxercalciferol  4 mcg Intravenous Q M,W,F-HD  . insulin aspart  0-5 Units Subcutaneous QHS  . insulin aspart  0-9 Units Subcutaneous TID WC  . multivitamin  1 tablet Oral QHS  . multivitamin with minerals  1 tablet Oral Daily  . vitamin C  500 mg Oral Daily

## 2013-10-04 NOTE — Discharge Summary (Signed)
Physician Discharge Summary  [name] ZOX:[id number] DOB: Nov 25, 1941 DOA: 10/01/2013  PCP: Pearson Grippe, MD  Admit date: 10/01/2013 Discharge date: 10/04/2013  Time spent: Greater than 30 minutes  Recommendations for Outpatient Follow-up:  1. Dr. Jones Broom, Orthopedics in 2 weeks. 2. Hemodialysis Center: Keep up scheduled dialysis appointments on Mondays, Wednesdays and Fridays. 3. Dr. Pearson Grippe, PCP 4. Periodically follow CBC & renal panel across dialysis. 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   S/P ORIF (open reduction internal fixation) fracture-right distal femoral fracture   Acute post-hemorrhagic anemia   Discharge Condition: Improved & Stable  Diet recommendation: Heart healthy and diabetic diet  Filed Weights   10/03/13 2209 10/04/13 0758 10/04/13 1215  Weight: 109.634 kg (241 lb 11.2 oz) 109.6 kg (241 lb 10 oz) 108.5 kg (239 lb 3.2 oz)    History of present illness:  72 year old male with history of ESRD-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 X-rays showed a right closed distal femur fracture.  Hospital Course:   S/P ORIF of Closed right distal femoral fracture  - Orthopedics consulted and performed surgery on 10/02/13. - Nonweightbearing RLE. Okay with passive range of movements. - Aspirin 325 mg by mouth twice a day for one month post surgery: DVT prophylaxis as per Orthopedics.  - OP follow up with orthopedics in 2 weeks  - SNF at discharge for rehabilitation.   ESRD-likely from diabetic nephropathy, on MWF HD  -  Nephrology consulted.  - Completed HD on 10/17  - Continue outpatient hemodialysis on Mondays, Wednesdays and Fridays.  Type II DM with nephropathy  - Reasonable inpatient control.  - Continue SSI.   Hypertension  - Controlled - Not on antihypertensives.  - Even fluid balance across HD.   Acute blood loss anemia from surgery/Anemia secondary to ESRD  - Hemoglobin dropped from 10.7 > 7.5 > 7.7 >7.2  - Continue Aranesp  - Patient was transfused 2 units of PRBCs across hemodialysis on 10/17 with appropriate improvement in hemoglobin to 9.1. - Periodically follow CBCs across dialysis.  Thrombocytopenia  - Unclear etiology.  - Resolved   History of atrial flutter  - Clinically In sinus rhythm.  - Not on anticoagulation PTA. Was on aspirin 81 mg daily  - Currently on postop DVT prophylaxis with aspirin 325 mg twice a day for one month. This can be transitioned to home dose aspirin 81 mg daily after completion of one month.   History of gout  - Asymptomatic.  - Continue allopurinol   Hypoxia/atelectasis  - Patient denies any complaints i.e. dyspnea, chest pain or palpitations.  - Does not look clinically volume overloaded.  - Chest x-ray without acute changes but has low inspiratory volumes & atelectasis.  - Aggressive use of incentive spirometry and follow O2 sats and may be able to come off oxygen soon.  - Remote history of tobacco use   Consultants:  Orthopedics  Nephrology  Procedures:  ORIF of closed right distal femoral fracture 10/14  Hemodialysis   Discharge Exam:  Complaints: Denies pain in the right lower extremity. Denies dyspnea.   Filed  Vitals:   10/04/13 1100 10/04/13 1130 10/04/13 1200 10/04/13 1215  BP: 104/61 109/55 110/57 121/61  Pulse: 86 85 84 84  Temp:    97.7 F (36.5 C)  TempSrc:    Oral  Resp: 16 16  16   Height:      Weight:    108.5 kg (239 lb 3.2 oz)  SpO2:    95%    General exam: Moderately built and nourished male, lying  comfortably supine in bed.  Respiratory system: Clear. No increased work of breathing.  Cardiovascular system: S1 & S2 heard, RRR. No JVD, murmurs, gallops, clicks or pedal edema.  Gastrointestinal system: Abdomen is nondistended, soft and nontender. Normal bowel sounds heard.  Central nervous system: Alert and oriented. No focal neurological deficits.  Extremities: Symmetric 5 x 5 power. AV fistula with thrill left upper arm. RLE surgical site dressing-clean, dry and intact. Immobilizer in place.   Discharge Instructions      Discharge Orders   Future Orders Complete By Expires   Call MD for:  difficulty breathing, headache or visual disturbances  As directed    Call MD for:  extreme fatigue  As directed    Call MD for:  persistant dizziness or light-headedness  As directed    Call MD for:  redness, tenderness, or signs of infection (pain, swelling, redness, odor or green/yellow discharge around incision site)  As directed    Call MD for:  severe uncontrolled pain  As directed    Call MD for:  temperature >100.4  As directed    Diet - low sodium heart healthy  As directed    Diet Carb Modified  As directed    Discharge instructions  As directed    Comments:     1. May remove ACE wrapping to right LE on 10/05/2013 and use dry dressing to surgical wound when necessary. 2. Continue to use right lower extremity immobilizer except when working with PT and OT 3. Nonweightbearing on right lower extremity. Okay with passive range of movements. 4. oxygen via nasal cannula at 2 L per minute. Titrate to maintain oxygen saturations >90%.       Medication List    STOP taking these medications       insulin lispro 100 UNIT/ML injection  Commonly known as:  HUMALOG      TAKE these medications       allopurinol 300 MG tablet  Commonly known as:  ZYLOPRIM  Take 150 mg by mouth daily.     aspirin 325 MG EC tablet  Take 1 tablet (325 mg total) by mouth 2 (two) times daily. Discontinue  after 10/30/2013 and resume Aspirin EC 81 mg daily.     b complex-vitamin c-folic acid 0.8 MG Tabs tablet  Take 0.8 mg by mouth daily.     fish oil-omega-3 fatty acids 1000 MG capsule  Take 1 g by mouth daily.     HYDROcodone-acetaminophen 5-325 MG per tablet  Commonly known as:  NORCO/VICODIN  Take 1 tablet by mouth every 8 (eight) hours as needed for pain.     insulin aspart 100 UNIT/ML injection  Commonly known as:  novoLOG  - 0-5 Units, Subcutaneous, Daily at bedtime, Correction coverage: HS scale  - CBG < 70: implement hypoglycemia protocol  - CBG 70 - 120: 0 units  - CBG 121 - 150: 0 units  - CBG 151 - 200: 0 units  - CBG 201 - 250: 2 units  - CBG 251 - 300:  3 units  - CBG 301 - 350: 4 units  - CBG 351 - 400: 5 units  - CBG > 400: call MD     insulin aspart 100 UNIT/ML injection  Commonly known as:  novoLOG  - 0-9 Units, Subcutaneous, 3 times daily with meals,   - CBG < 70: implement hypoglycemia protocol  - CBG 70 - 120: 0 units  - CBG 121 - 150: 1 unit  - CBG 151 - 200: 2 units  - CBG 201 - 250: 3 units  - CBG 251 - 300: 5 units  - CBG 301 - 350: 7 units  - CBG 351 - 400: 9 units  - CBG > 400: call MD     multivitamin with minerals Tabs tablet  Take 1 tablet by mouth daily.     vitamin C 500 MG tablet  Commonly known as:  ASCORBIC ACID  Take 500 mg by mouth daily.       Follow-up Information   Schedule an appointment as soon as possible for a visit with Pearson Grippe, MD.   Specialty:  Internal Medicine      Follow up with Mable Paris, MD. Schedule an appointment as soon as possible for a visit in 2 weeks.   Specialty:  Orthopedic Surgery   Contact information:   [address] [place] [address] [phone]       Follow up with Hemodialysis Center. (Keep up scheduled dialysis appointments on Mon, Wed, Friday's.)        The results of significant diagnostics from this hospitalization (including  imaging, microbiology, ancillary and laboratory) are listed below for reference.    Significant Diagnostic Studies: Dg Chest 1 View  10/01/2013   CLINICAL DATA:  Fall with no chest complaints.  EXAM: CHEST - 1 VIEW  COMPARISON:  07/02/2013.  FINDINGS: Cardiomegaly. Calcified tortuous aorta. Mild vascular congestion. Mild basilar atelectasis. Osseous demineralization. Dialysis catheter has been removed since the previous radiograph.  IMPRESSION: Cardiomegaly without active infiltrates or failure. Improved aeration from priors.   Electronically Signed   By: Davonna Belling M.D.   On: 10/01/2013 01:57   Dg Chest 2 View  10/03/2013   CLINICAL DATA:  Hypoxia, short of breath  EXAM: CHEST  2 VIEW  COMPARISON:  Most recent prior chest x-ray 10/01/2013  FINDINGS: Stable mild cardiomegaly. Mediastinal contours are unchanged. Atherosclerotic calcification noted in the transverse aorta. Inspiratory volumes are slightly lower. There is persistent of horizontal atelectasis versus scarring in the bilateral bases. Similar degree of pulmonary vascular congestion without overt edema. No pleural effusion or pneumothorax. Metallic stents project over the heart. No acute osseous abnormality.  IMPRESSION: Slightly lower inspiratory volumes. Otherwise, no acute cardiopulmonary process.  Stable vascular congestion without overt edema and bibasilar atelectasis versus scarring.   Electronically Signed   By: Malachy Moan M.D.   On: 10/03/2013 13:35   Dg Hip Complete Right  10/01/2013   *RADIOLOGY REPORT*  Clinical Data: Right knee pain, fall.  History of right hip surgery.  RIGHT HIP - COMPLETE 2+ VIEW  Comparison: None available at time of study interpretation.  Findings: Five images of the pelvis and right hip.  Status post right hip total arthroplasty, hardware appears intact well seated without periprosthetic lucency.  No fracture deformity or dislocation.  Severe left hip joint space narrowing with mild marginal spurring.   Sacroiliac joints are symmetric.  Bone mineral density is decreased without destructive bony lesions.  Moderate vascular calcification.  Periarticular soft  tissue planes are not suspicious.  IMPRESSION: No acute fracture deformity or dislocation.  Status post right hip total arthroplasty without radiographic findings of hardware failure.  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   Original Report Authenticated By: Awilda Metro   Dg Femur Right  10/01/2013   CLINICAL DATA:  Femur fracture.  EXAM: RIGHT FEMUR - 2 VIEW; DG C-ARM 1-60 MIN  COMPARISON:  10/01/2013  FINDINGS: Intraoperative images of the right femur were obtained. A lateral femoral plate has been placed. There are proximal and distal screws. Again noted is a displaced fracture of the distal femur.  IMPRESSION: Internal fixation of the displaced distal femur fracture.   Electronically Signed   By: Richarda Overlie M.D.   On: 10/01/2013 19:16   Dg Femur Right Port  10/01/2013   CLINICAL DATA:  Status post fracture fixation.  EXAM: PORTABLE RIGHT FEMUR - 2 VIEW  COMPARISON:  Plain films earlier this same day.  FINDINGS: Plate and screws are in place for fixation of a distal femur fracture. Hardware is intact. Position and alignment are anatomic. No new abnormality is identified. Surgical staples are noted.  IMPRESSION: ORIF distal right femur fracture without acute abnormality.   Electronically Signed   By: Drusilla Kanner M.D.   On: 10/01/2013 20:45   Dg Knee Complete 4 Views Right  10/01/2013   *RADIOLOGY REPORT*  Clinical Data: Fall, knee pain.  RIGHT KNEE - COMPLETE 4+ VIEW  Comparison: None available at time of study interpretation.  Findings: Comminuted distal femur metadiaphyseal impacted fracture with slight medial deviation of the distal bony fragments.  No definite interarticular extension.  No destructive bony lesions. Bone mineral density is decreased.  Moderate suprapatellar joint effusion.  Mild vascular calcifications.   IMPRESSION: Minimally displaced distal femur impacted metadiaphyseal fracture without dislocation.  Osteopenia.   Original Report Authenticated By: Awilda Metro   Dg C-arm 1-60 Min  10/01/2013   CLINICAL DATA:  Femur fracture.  EXAM: RIGHT FEMUR - 2 VIEW; DG C-ARM 1-60 MIN  COMPARISON:  10/01/2013  FINDINGS: Intraoperative images of the right femur were obtained. A lateral femoral plate has been placed. There are proximal and distal screws. Again noted is a displaced fracture of the distal femur.  IMPRESSION: Internal fixation of the displaced distal femur fracture.   Electronically Signed   By: Richarda Overlie M.D.   On: 10/01/2013 19:16    Microbiology: Recent Results (from the past 240 hour(s))  SURGICAL PCR SCREEN     Status: None   Collection Time    10/01/13 12:54 PM      Result Value Range Status   MRSA, PCR NEGATIVE  NEGATIVE Final   Staphylococcus aureus NEGATIVE  NEGATIVE Final   Comment:            The Xpert SA Assay (FDA     approved for NASAL specimens     in patients over 7 years of age),     is one component of     a comprehensive surveillance     program.  Test performance has     been validated by [employer] for patients greater     than or equal to 83 year old.     It is not intended     to diagnose infection nor to     guide or monitor treatment.     Labs: Basic Metabolic Panel:  Recent Labs Lab 10/01/13 0305 10/02/13 0500 10/03/13 0410 10/03/13 0944 10/04/13  0700 10/04/13 0800  NA 135 131* 136  --  132* 132*  K 3.9 4.0 4.7  --  5.0 4.3  CL 92* 89* 94*  --  92* 91*  CO2 27 28 32  --  24 27  GLUCOSE 189* 216* 186*  --  177* 184*  BUN 23 50* 20  --  37* 37*  CREATININE 3.84* 7.08* 3.88*  --  6.03* 6.20*  CALCIUM 9.8 8.7 9.9  --  9.8 9.9  PHOS  --   --   --  4.7*  --  4.8*   Liver Function Tests:  Recent Labs Lab 10/02/13 0500 10/03/13 0410 10/04/13 0700 10/04/13 0800  AST 26 28 54*  --   ALT 6 21 25   --   ALKPHOS 123* 117 119*  --    BILITOT 0.7 0.3 0.3  --   PROT 7.2 6.8 6.8  --   ALBUMIN 2.8* 3.1* 2.8* 2.9*   No results found for this basename: LIPASE, AMYLASE,  in the last 168 hours No results found for this basename: AMMONIA,  in the last 168 hours CBC:  Recent Labs Lab 10/01/13 0305 10/02/13 0500 10/03/13 0410 10/03/13 0944 10/04/13 0800 10/04/13 1222  WBC 18.9* 6.2 12.1* 12.2* 12.6* 12.1*  NEUTROABS 17.3*  --   --   --   --   --   HGB 9.7* 10.7* 7.5* 7.7* 7.2* 9.1*  HCT 30.3* 30.2* 24.2* 24.4* 22.5* 27.0*  MCV 93.2 91.8 95.7 95.7 94.1 92.2  PLT 275 93* 222 214 227 187   Cardiac Enzymes: No results found for this basename: CKTOTAL, CKMB, CKMBINDEX, TROPONINI,  in the last 168 hours BNP: BNP (last 3 results) No results found for this basename: PROBNP,  in the last 8760 hours CBG:  Recent Labs Lab 10/03/13 0748 10/03/13 1154 10/03/13 1723 10/03/13 2205 10/04/13 0739  GLUCAP 180* 166* 186* 177* 238*    Additional labs: 1. None   Signed:  Marcellus Scott, MD, FACP, FHM. [facility] Pager [phone]  If 7PM-7AM, please contact night-coverage [email or web address] Password [email or web address] 10/04/2013, 1:13 PM

## 2013-10-04 NOTE — Progress Notes (Signed)
PATIENT ID: Celene Kras T Broecker   3 Days Post-Op Procedure(s) (LRB): OPEN REDUCTION INTERNAL FIXATION (ORIF) DISTAL FEMUR FRACTURE (Right)  Subjective: On dialysis during visit this am. Reports pain is minimal in right LE. Worked with PT on ROM and sitting up in bed today. Understands NWB limitations.  Objective:  Filed Vitals:   10/04/13 1020  BP: 116/51  Pulse: 86  Temp: 98.7 F (37.1 C)  Resp: 16     AWake, alert orientated Limited exam today as patient is undergoing dialysis Right LE dressing/ACE wrap c/d/i Distally wiggles toes, NVI  Labs:   Recent Labs  10/02/13 0500 10/03/13 0410 10/03/13 0944 10/04/13 0800  HGB 10.7* 7.5* 7.7* 7.2*   Recent Labs  10/03/13 0944 10/04/13 0800  WBC 12.2* 12.6*  RBC 2.55* 2.39*  HCT 24.4* 22.5*  PLT 214 227   Recent Labs  10/04/13 0700 10/04/13 0800  NA 132* 132*  K 5.0 4.3  CL 92* 91*  CO2 24 27  BUN 37* 37*  CREATININE 6.03* 6.20*  GLUCOSE 177* 184*  CALCIUM 9.8 9.9    Assessment and Plan: 3 days s/p ORIF distal femur fracture Dressing change tomorrow Continue with PT, NWB right LE, okay for passive ROM ABLA 7.2 today- managed by primary team, likely transfusion 2 units today during dialysis Discharge to SNF when medically stable, OP pain rx per primary team, would recommend Norco if able to take with current medical conditions   VTE proph: per primary team

## 2013-10-05 LAB — TYPE AND SCREEN
ABO/RH(D): O POS
Antibody Screen: NEGATIVE
Unit division: 0
Unit division: 0

## 2013-10-07 ENCOUNTER — Other Ambulatory Visit: Payer: Self-pay | Admitting: *Deleted

## 2013-10-07 MED ORDER — HYDROCODONE-ACETAMINOPHEN 5-325 MG PO TABS: ORAL_TABLET | ORAL | Status: DC

## 2013-10-08 ENCOUNTER — Non-Acute Institutional Stay (SKILLED_NURSING_FACILITY): Payer: Medicare Other | Admitting: Internal Medicine

## 2013-10-08 DIAGNOSIS — IMO0002 Reserved for concepts with insufficient information to code with codable children: Secondary | ICD-10-CM

## 2013-10-08 DIAGNOSIS — Z9889 Other specified postprocedural states: Secondary | ICD-10-CM

## 2013-10-08 DIAGNOSIS — E1129 Type 2 diabetes mellitus with other diabetic kidney complication: Secondary | ICD-10-CM

## 2013-10-08 DIAGNOSIS — S7290XD Unspecified fracture of unspecified femur, subsequent encounter for closed fracture with routine healing: Secondary | ICD-10-CM

## 2013-10-08 DIAGNOSIS — N058 Unspecified nephritic syndrome with other morphologic changes: Secondary | ICD-10-CM

## 2013-10-08 DIAGNOSIS — E1165 Type 2 diabetes mellitus with hyperglycemia: Secondary | ICD-10-CM

## 2013-10-08 DIAGNOSIS — I10 Essential (primary) hypertension: Secondary | ICD-10-CM

## 2013-10-08 DIAGNOSIS — D62 Acute posthemorrhagic anemia: Secondary | ICD-10-CM

## 2013-10-08 DIAGNOSIS — Z8781 Personal history of (healed) traumatic fracture: Secondary | ICD-10-CM

## 2013-10-08 DIAGNOSIS — S72401D Unspecified fracture of lower end of right femur, subsequent encounter for closed fracture with routine healing: Secondary | ICD-10-CM

## 2013-10-08 DIAGNOSIS — N186 End stage renal disease: Secondary | ICD-10-CM
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
## 2013-10-10 NOTE — Addendum Note (Signed)
Addendum created 10/10/13 1153 by Kipp Brood, MD   Modules edited: Anesthesia Responsible Staff

## 2013-10-24 ENCOUNTER — Encounter (HOSPITAL_COMMUNITY): Payer: Self-pay | Admitting: General Practice

## 2013-10-24 ENCOUNTER — Inpatient Hospital Stay (HOSPITAL_COMMUNITY)
Admission: EM | Admit: 2013-10-24 | Discharge: 2013-10-30 | DRG: 377 | Disposition: A | Discharge status: Skilled Nursing Facility | Payer: Medicare Other | Attending: Internal Medicine | Admitting: Internal Medicine

## 2013-10-24 DIAGNOSIS — Z992 Dependence on renal dialysis: Secondary | ICD-10-CM | POA: Diagnosis present

## 2013-10-24 DIAGNOSIS — Z96649 Presence of unspecified artificial hip joint: Secondary | ICD-10-CM

## 2013-10-24 DIAGNOSIS — K260 Acute duodenal ulcer with hemorrhage: Principal | ICD-10-CM | POA: Diagnosis present

## 2013-10-24 DIAGNOSIS — E669 Obesity, unspecified: Secondary | ICD-10-CM | POA: Diagnosis present

## 2013-10-24 DIAGNOSIS — Z7982 Long term (current) use of aspirin: Secondary | ICD-10-CM

## 2013-10-24 DIAGNOSIS — IMO0002 Reserved for concepts with insufficient information to code with codable children: Secondary | ICD-10-CM | POA: Diagnosis present

## 2013-10-24 DIAGNOSIS — Z87891 Personal history of nicotine dependence: Secondary | ICD-10-CM

## 2013-10-24 DIAGNOSIS — S72401A Unspecified fracture of lower end of right femur, initial encounter for closed fracture: Secondary | ICD-10-CM

## 2013-10-24 DIAGNOSIS — Z794 Long term (current) use of insulin: Secondary | ICD-10-CM

## 2013-10-24 DIAGNOSIS — R195 Other fecal abnormalities: Secondary | ICD-10-CM

## 2013-10-24 DIAGNOSIS — IMO0001 Reserved for inherently not codable concepts without codable children: Secondary | ICD-10-CM

## 2013-10-24 DIAGNOSIS — I120 Hypertensive chronic kidney disease with stage 5 chronic kidney disease or end stage renal disease: Secondary | ICD-10-CM | POA: Diagnosis present

## 2013-10-24 DIAGNOSIS — Z4789 Encounter for other orthopedic aftercare: Secondary | ICD-10-CM

## 2013-10-24 DIAGNOSIS — Z882 Allergy status to sulfonamides status: Secondary | ICD-10-CM

## 2013-10-24 DIAGNOSIS — Z6829 Body mass index (BMI) 29.0-29.9, adult: Secondary | ICD-10-CM

## 2013-10-24 DIAGNOSIS — D62 Acute posthemorrhagic anemia: Secondary | ICD-10-CM

## 2013-10-24 DIAGNOSIS — Z880 Allergy status to penicillin: Secondary | ICD-10-CM

## 2013-10-24 DIAGNOSIS — E1165 Type 2 diabetes mellitus with hyperglycemia: Secondary | ICD-10-CM | POA: Diagnosis present

## 2013-10-24 DIAGNOSIS — K2270 Barrett's esophagus without dysplasia: Secondary | ICD-10-CM | POA: Diagnosis present

## 2013-10-24 DIAGNOSIS — M109 Gout, unspecified: Secondary | ICD-10-CM | POA: Diagnosis present

## 2013-10-24 DIAGNOSIS — K264 Chronic or unspecified duodenal ulcer with hemorrhage: Secondary | ICD-10-CM | POA: Diagnosis present

## 2013-10-24 DIAGNOSIS — Z9889 Other specified postprocedural states: Secondary | ICD-10-CM

## 2013-10-24 DIAGNOSIS — D649 Anemia, unspecified: Secondary | ICD-10-CM | POA: Diagnosis present

## 2013-10-24 DIAGNOSIS — N186 End stage renal disease: Secondary | ICD-10-CM | POA: Diagnosis present

## 2013-10-24 DIAGNOSIS — E1129 Type 2 diabetes mellitus with other diabetic kidney complication: Secondary | ICD-10-CM | POA: Diagnosis present

## 2013-10-24 DIAGNOSIS — K31811 Angiodysplasia of stomach and duodenum with bleeding: Secondary | ICD-10-CM | POA: Diagnosis present

## 2013-10-24 DIAGNOSIS — J4489 Other specified chronic obstructive pulmonary disease: Secondary | ICD-10-CM | POA: Diagnosis present

## 2013-10-24 DIAGNOSIS — I10 Essential (primary) hypertension: Secondary | ICD-10-CM | POA: Diagnosis present

## 2013-10-24 DIAGNOSIS — J449 Chronic obstructive pulmonary disease, unspecified: Secondary | ICD-10-CM | POA: Diagnosis present

## 2013-10-24 DIAGNOSIS — K5900 Constipation, unspecified: Secondary | ICD-10-CM | POA: Diagnosis present

## 2013-10-24 DIAGNOSIS — N2581 Secondary hyperparathyroidism of renal origin: Secondary | ICD-10-CM | POA: Diagnosis present

## 2013-10-24 DIAGNOSIS — K922 Gastrointestinal hemorrhage, unspecified: Secondary | ICD-10-CM | POA: Diagnosis present

## 2013-10-24 DIAGNOSIS — K31819 Angiodysplasia of stomach and duodenum without bleeding: Secondary | ICD-10-CM | POA: Diagnosis present

## 2013-10-24 HISTORY — DX: Other chronic pain: G89.29

## 2013-10-24 HISTORY — DX: Gout, unspecified: M10.9

## 2013-10-24 HISTORY — DX: Depression, unspecified: F32.A

## 2013-10-24 HISTORY — DX: Dependence on renal dialysis: Z99.2

## 2013-10-24 HISTORY — DX: Anxiety disorder, unspecified: F41.9

## 2013-10-24 HISTORY — DX: Unspecified osteoarthritis, unspecified site: M19.90

## 2013-10-24 HISTORY — DX: Dorsalgia, unspecified: M54.9

## 2013-10-24 HISTORY — DX: Personal history of other medical treatment: Z92.89

## 2013-10-24 HISTORY — DX: Type 2 diabetes mellitus without complications: E11.9

## 2013-10-24 HISTORY — DX: Major depressive disorder, single episode, unspecified: F32.9

## 2013-10-24 HISTORY — DX: End stage renal disease: N18.6

## 2013-10-24 LAB — CBC WITH DIFFERENTIAL/PLATELET
Basophils Absolute: 0.1 10*3/uL (ref 0.0–0.1)
HCT: 20.5 % — ABNORMAL LOW (ref 39.0–52.0)
Lymphocytes Relative: 14 % (ref 12–46)
Lymphs Abs: 1.3 10*3/uL (ref 0.7–4.0)
Neutro Abs: 7 10*3/uL (ref 1.7–7.7)
Neutrophils Relative %: 75 % (ref 43–77)
Platelets: 280 10*3/uL (ref 150–400)
RBC: 2.1 MIL/uL — ABNORMAL LOW (ref 4.22–5.81)
RDW: 19.3 % — ABNORMAL HIGH (ref 11.5–15.5)
WBC: 9.4 10*3/uL (ref 4.0–10.5)

## 2013-10-24 LAB — CBC
Platelets: 242 10*3/uL (ref 150–400)
RBC: 2.23 MIL/uL — ABNORMAL LOW (ref 4.22–5.81)
RDW: 19.2 % — ABNORMAL HIGH (ref 11.5–15.5)
WBC: 9 10*3/uL (ref 4.0–10.5)

## 2013-10-24 LAB — COMPREHENSIVE METABOLIC PANEL
ALT: 20 U/L (ref 0–53)
AST: 24 U/L (ref 0–37)
Alkaline Phosphatase: 136 U/L — ABNORMAL HIGH (ref 39–117)
CO2: 28 mEq/L (ref 19–32)
Chloride: 94 mEq/L — ABNORMAL LOW (ref 96–112)
GFR calc Af Amer: 14 mL/min — ABNORMAL LOW (ref >90)
GFR calc non Af Amer: 12 mL/min — ABNORMAL LOW (ref >90)
Glucose, Bld: 113 mg/dL — ABNORMAL HIGH (ref 70–99)
Potassium: 3.4 mEq/L — ABNORMAL LOW (ref 3.5–5.1)
Sodium: 138 mEq/L (ref 135–145)
Total Bilirubin: 0.4 mg/dL (ref 0.3–1.2)

## 2013-10-24 LAB — GLUCOSE, CAPILLARY
Glucose-Capillary: 109 mg/dL — ABNORMAL HIGH (ref 70–99)
Glucose-Capillary: 138 mg/dL — ABNORMAL HIGH (ref 70–99)

## 2013-10-24 LAB — OCCULT BLOOD, POC DEVICE: Fecal Occult Bld: POSITIVE — AB

## 2013-10-24 MED ORDER — ONDANSETRON HCL 4 MG PO TABS: 4.0000 mg | ORAL_TABLET | Freq: Four times a day (QID) | ORAL | Status: DC | PRN

## 2013-10-24 MED ORDER — SODIUM CHLORIDE 0.9 % IV SOLN
INTRAVENOUS | Status: DC
  Administered 2013-10-24: 19:00:00 via INTRAVENOUS

## 2013-10-24 MED ORDER — PANTOPRAZOLE SODIUM 40 MG IV SOLR: 40.0000 mg | Freq: Two times a day (BID) | INTRAVENOUS | Status: DC

## 2013-10-24 MED ORDER — SODIUM CHLORIDE 0.9 % IV SOLN: 250.0000 mL | INTRAVENOUS | Status: DC | PRN

## 2013-10-24 MED ORDER — SODIUM CHLORIDE 0.9 % IJ SOLN
3.0000 mL | Freq: Two times a day (BID) | INTRAMUSCULAR | Status: DC
  Administered 2013-10-26 (×2): 3 mL via INTRAVENOUS

## 2013-10-24 MED ORDER — HYDROCODONE-ACETAMINOPHEN 5-325 MG PO TABS
1.0000 | ORAL_TABLET | Freq: Three times a day (TID) | ORAL | Status: DC | PRN
  Administered 2013-10-25 – 2013-10-27 (×3): 1 via ORAL
  Filled 2013-10-24 (×2): qty 1

## 2013-10-24 MED ORDER — ONDANSETRON HCL 4 MG/2ML IJ SOLN: 4.0000 mg | Freq: Four times a day (QID) | INTRAMUSCULAR | Status: DC | PRN

## 2013-10-24 MED ORDER — SODIUM CHLORIDE 0.9 % IJ SOLN
3.0000 mL | INTRAMUSCULAR | Status: DC | PRN
  Administered 2013-10-25: 10 mL via INTRAVENOUS

## 2013-10-24 MED ORDER — SODIUM CHLORIDE 0.9 % IJ SOLN: 3.0000 mL | Freq: Two times a day (BID) | INTRAMUSCULAR | Status: DC

## 2013-10-24 MED ORDER — SODIUM CHLORIDE 0.9 % IV BOLUS (SEPSIS)
250.0000 mL | Freq: Once | INTRAVENOUS | Status: AC
  Administered 2013-10-24: 250 mL via INTRAVENOUS

## 2013-10-24 MED ORDER — PANTOPRAZOLE SODIUM 40 MG IV SOLR
40.0000 mg | Freq: Two times a day (BID) | INTRAVENOUS | Status: DC
  Administered 2013-10-25 – 2013-10-26 (×2): 40 mg via INTRAVENOUS
  Filled 2013-10-24 (×4): qty 40

## 2013-10-24 MED ORDER — INSULIN ASPART 100 UNIT/ML ~~LOC~~ SOLN
0.0000 [IU] | Freq: Three times a day (TID) | SUBCUTANEOUS | Status: DC
  Administered 2013-10-25: 2 [IU] via SUBCUTANEOUS
  Administered 2013-10-26: 1 [IU] via SUBCUTANEOUS
  Administered 2013-10-27: 5 [IU] via SUBCUTANEOUS
  Administered 2013-10-27: 2 [IU] via SUBCUTANEOUS
  Administered 2013-10-28 – 2013-10-29 (×2): 1 [IU] via SUBCUTANEOUS
  Administered 2013-10-29 – 2013-10-30 (×3): 2 [IU] via SUBCUTANEOUS

## 2013-10-24 NOTE — Consult Note (Signed)
[facility] Consult: 4:02 PM 10/24/2013  LOS: 0 days    Referring Provider: PA: Micheline Maze.  Dr Elvera Lennox is the hospitalist  Primary Care Physician:  Pearson Grippe, MD Primary Gastroenterologist:  None   Reason for Consultation:  Heme + dark stools   HPI: [name] is a 72 y.o. male.  Discharged to SNF 10/18 after ORIF of distal right femur fracture.  Not requiring any pain meds.  DVT prophylaxis consists of 81 ASA.  Has longstanding ESRD, dialysis tts.  Has type 2 IDDM.  Never had EGD or colonoscopy, no prior hx of GI bleeding  Transfused 2 units blood post ORIF last month. Hgb drifted 9.7 - 10.7 down to 7.2 but was 9.1 at discharge.   Says his stools normally are formed 2 x daily, brown.  At SNF having looser BMs 3 x daily.  This AM had BMs at 1 and 3 AM, again at 7:30 AM.  He never saw the stools.  He took an antidiarrheal tablet in order not to have accident during HD and has had no stools since.  At dialysis his Hgb was 6.2 and they sent him to ED.  Stool is dark and FOB +.  He has no nausea, no abdominal pain.  Appetite mildly reduced but he really dislikes the SNFs food.  Only very mild dizziness.  Generally feels pretty well.     Past Medical History  Diagnosis Date  . Diabetes mellitus   . Hypertension   . Atrial flutter   . Obesity   . COPD (chronic obstructive pulmonary disease)   . Gout   . CHF (congestive heart failure)   . Anemia   . Chronic kidney disease     [facility] M- [facility].     Past Surgical History  Procedure Laterality Date  . Joint replacement      rt hip  . Arteriovenous graft placement  2008    left arm  . Av fistula placement Left 07/02/2013    Procedure: INSERTION OF ARTERIOVENOUS (AV) GORE-TEX GRAFT ARM;  Surgeon: Chuck Hint, MD;  Location: [facility] OR;  Service: Vascular;  Laterality: Left;  . Ligation arteriovenous gortex graft Left 07/02/2013    Procedure: LIGATION ARTERIOVENOUS GORTEX GRAFT;   Surgeon: Chuck Hint, MD;  Location: [facility] OR;  Service: Vascular;  Laterality: Left;  . Insertion of dialysis catheter N/A 07/02/2013    Procedure: INSERTION OF DIALYSIS CATHETER;  Surgeon: Chuck Hint, MD;  Location: [facility] OR;  Service: Vascular;  Laterality: N/A;  Left Internal Jugular Placement  . Orif femur fracture Right 10/01/2013    Procedure: OPEN REDUCTION INTERNAL FIXATION (ORIF) DISTAL FEMUR FRACTURE;  Surgeon: Mable Paris, MD;  Location: [facility] OR;  Service: Orthopedics;  Laterality: Right;    Prior to Admission medications   Medication Sig Start Date End Date Taking? Authorizing Provider  allopurinol (ZYLOPRIM) 300 MG tablet Take 150 mg by mouth daily.    Yes Historical Provider, MD  aspirin EC 325 MG tablet Take 325 mg by mouth 2 (two) times daily.   Yes Historical Provider, MD  fish oil-omega-3 fatty acids 1000 MG capsule Take 1 g by mouth daily.   Yes Historical Provider, MD  HYDROcodone-acetaminophen (NORCO/VICODIN) 5-325 MG per tablet Take 1 tablet by mouth every 8 (eight) hours as needed for moderate pain.   Yes Historical Provider, MD  insulin aspart (NOVOLOG) 100 UNIT/ML injection Inject 0-5 Units into the skin 4 (four) times daily -  before meals and  at bedtime. 0 -150 = 0 units,   151+= 5 units   Yes Historical Provider, MD  multivitamin (RENA-VIT) TABS tablet Take 1 tablet by mouth daily.   Yes Historical Provider, MD  Nutritional Supplements (FEEDING SUPPLEMENT, NEPRO CARB STEADY,) LIQD Take 237 mLs by mouth 3 (three) times a week. On Monday, Wednesday, and Friday   Yes Historical Provider, MD  vitamin C (ASCORBIC ACID) 500 MG tablet Take 500 mg by mouth daily.   Yes Historical Provider, MD  aspirin EC 81 MG tablet Take 81 mg by mouth daily.    Historical Provider, MD    Scheduled Meds:   Infusions: . sodium chloride     PRN Meds:    Allergies as of 10/24/2013 - Review Complete 10/24/2013  Allergen Reaction Noted  . Penicillins Rash  03/12/2012  . Sulfa antibiotics Rash 03/12/2012  . Glucophage [metformin hydrochloride] Rash 03/12/2012    Family History  Problem Relation Age of Onset  . Heart attack Mother   . Heart attack Father   . Diabetes Sister   . Diabetes Brother     History   Social History  . Marital Status: Married    Spouse Name: N/A    Number of Children: N/A  . Years of Education: N/A   Occupational History  . Not on file.   Social History Main Topics  . Smoking status: Former Smoker    Types: Cigarettes    Quit date: 12/19/1984  . Smokeless tobacco: Not on file  . Alcohol Use: No  . Drug Use: No  . Sexual Activity: No   Other Topics Concern  . Not on file   Social History Narrative  . No narrative on file    REVIEW OF SYSTEMS: Constitutional:  Weight stable.  Some increased weakness and fatigue in last few days:  Not profound ENT:  No nose bleeds Pulm:  No cough or SOB CV:  No chest pain , no palpitations.  Very limited tachy with exertion GU:  Makes urine GI:  No heartburn, no dysphagia. MS:  Not yet back to using walker, still using wheelchair Heme:  Does not recall significant anemia in past.    Transfusions:  2 units in 09/2013 post femur surgery Neuro:  No headache.  A little dizzy spell this AM Derm:  Stable non-pruritic rash in lower legs Endocrine:  No excessive thirst or sweats or chills Immunization:  Flu vaccine current Travel:  none   PHYSICAL EXAM: Vital signs in last 24 hours: Filed Vitals:   10/24/13 1557  BP: 101/41  Pulse: 89  Temp:   Resp: 18   Wt Readings from Last 3 Encounters:  10/04/13 108.5 kg (239 lb 3.2 oz)  10/04/13 108.5 kg (239 lb 3.2 oz)  07/02/13 104.2 kg (229 lb 11.5 oz)   General: pleasant, elderly, obese WM.  He looks well Head:  No asymmetry or swelling  Eyes:  No icterus or pallor  Ears:  Not HOH  Nose:  No discharge Mouth:  Clear, moist, good dental repair Neck:  No mass, no JVD Lungs:  Clear bil.  Breathing not  labored Heart: rrr.  No MRG Abdomen:  Soft, obese, active BS, ND.  No bruits.  No HSM.  Few small injection site bruises Rectal: not done.  Stool dark and FOB + but not melenic   Musc/Skeltl: kyphosis.  Scar in right lateral thigh is covered with bandage Extremities:  + edema from knee down bil.  There is venous stasis dermatitis  with this  Neurologic:  Oriented x 3.  Good recall and historian.  No tremor.  Moves all 4s.  No confusion Skin:  Stasis dermatitis on LE.  Purpura on UEs Tattoos:  none Nodes:  No cervical adenopathy   Psych:  Pleasant, very alert and cooperative.  Easy going.   Intake/Output from previous day:   Intake/Output this shift:    LAB RESULTS:   Ref. Range 10/03/2013 04:10 10/03/2013 09:44 10/04/2013 08:00 10/04/2013 12:22 10/24/2013 14:35  WBC Latest Range: 4.0-10.5 K/uL 12.1 (H) 12.2 (H) 12.6 (H) 12.1 (H) 9.4  RBC Latest Range: 4.22-5.81 MIL/uL 2.53 (L) 2.55 (L) 2.39 (L) 2.93 (L) 2.10 (L)  Hemoglobin Latest Range: 13.0-17.0 g/dL 7.5 (L) 7.7 (L) 7.2 (L) 9.1 (L) 6.6 (LL)  HCT Latest Range: 39.0-52.0 % 24.2 (L) 24.4 (L) 22.5 (L) 27.0 (L) 20.5 (L)  MCV Latest Range: 78.0-100.0 fL 95.7 95.7 94.1 92.2 97.6  MCH Latest Range: 26.0-34.0 pg 29.6 30.2 30.1 31.1 31.4  MCHC Latest Range: 30.0-36.0 g/dL 16.1 09.6 04.5 40.9 81.1  RDW Latest Range: 11.5-15.5 % 18.6 (H) 18.3 (H) 18.2 (H) 17.3 (H) 19.3 (H)      BMET Lab Results  Component Value Date   NA 138 10/24/2013   NA 132* 10/04/2013   NA 132* 10/04/2013   K 3.4* 10/24/2013   K 4.3 10/04/2013   K 5.0 10/04/2013   CL 94* 10/24/2013   CL 91* 10/04/2013   CL 92* 10/04/2013   CO2 28 10/24/2013   CO2 27 10/04/2013   CO2 24 10/04/2013   GLUCOSE 113* 10/24/2013   GLUCOSE 184* 10/04/2013   GLUCOSE 177* 10/04/2013   BUN 37* 10/24/2013   BUN 37* 10/04/2013   BUN 37* 10/04/2013   CREATININE 4.51* 10/24/2013   CREATININE 6.20* 10/04/2013   CREATININE 6.03* 10/04/2013   CALCIUM 9.9 10/24/2013   CALCIUM 9.9 10/04/2013    CALCIUM 9.8 10/04/2013   LFT  Recent Labs  10/24/13 1435  PROT 6.6  ALBUMIN 3.1*  AST 24  ALT 20  ALKPHOS 136*  BILITOT 0.4   PT/INR Lab Results  Component Value Date   INR 1.00 10/01/2013   INR 1.06 05/21/2012   INR 0.88 SLIGHT HEMOLYSIS 12/29/2010   Hepatitis Panel No results found for this basename: HEPBSAG, HCVAB, HEPAIGM, HEPBIGM,  in the last 72 hours C-Diff No components found with this basename: cdiff    RADIOLOGY STUDIES: No results found.  ENDOSCOPIC STUDIES: none  IMPRESSION:   *  Dark fob + stool and anemia Rule out ugi bleed One unit blood transfusing at present.  Anemia during 09/2013 requiring transfusion 2 units blood. On aranesp  *  ESRD.  Dialysis on TTS  *  Sp right distal femur fracture. S/p ORIF.   Admitted 10/14 - 10/05/2013.  Discharged to SNF   *  Type 2 IDDM   PLAN:     *  EGD tomorrow.  At 0845.  *  Transfuse with one unit of blood, a second unit is available to transfuse if needed.  Since he is a dialysis pt it may be prudent to transfuse second during dialysis since he is stable at present.    Follow CBC    Jennye Moccasin  10/24/2013, 4:02 PM Pager: [phone]

## 2013-10-24 NOTE — ED Notes (Signed)
Admitting md at bedside

## 2013-10-24 NOTE — ED Notes (Signed)
Patient arrived via [place] from [facility]. Patient is a renal dialysis patient that has had a drop in Hgb to 6.2 with dark stool that were tested positive for occult. BP 94/41, HR 92, A/O. Patient has a femur fracture from 10/15 according to [facility].

## 2013-10-24 NOTE — ED Notes (Signed)
Pt reports feeling like his cbg is low and requesting food, feeling lightheaded and diaphoretic, cbg 109. Dr docherty notified and per her order, gave pt crackers, pb and milk.

## 2013-10-24 NOTE — ED Notes (Signed)
PIV

## 2013-10-24 NOTE — ED Provider Notes (Addendum)
CSN: [id number]     Arrival date & time 10/24/13  1227 History   First MD Initiated Contact with Patient 10/24/13 1233     Chief Complaint  Patient presents with  . GI Bleeding   (Consider location/radiation/quality/duration/timing/severity/associated sxs/prior Treatment) HPI Comments: PT states he had a dark stool at rehab facility today, had labs draw at HD yesterday w/ a Hb drop to 6.2.  He has recent distal femur fracture repair.  Denies anticoagulant use other than ASA 81mg .  No hematuria, bleeding ums. Denies CO, SOB, palpitations.   Patient is a 72 y.o. male presenting with hematochezia. No language interpreter was used.  Rectal Bleeding Quality:  Black and tarry Duration:  1 hour Timing:  Unable to specify Progression:  Unable to specify Chronicity:  New Context: defecation, diarrhea and spontaneously   Context: not anal fissures, not anal penetration, not constipation, not foreign body and not rectal injury   Similar prior episodes: no   Relieved by:  Nothing Worsened by:  Nothing tried Ineffective treatments:  None tried Associated symptoms: no abdominal pain, no dizziness, no epistaxis, no fever, no hematemesis, no light-headedness, no loss of consciousness, no recent illness and no vomiting   Associated symptoms comment:  Fatigue Risk factors: no anticoagulant use, no hx of colorectal cancer, no hx of colorectal surgery, no hx of IBD, no liver disease, no NSAID use and no steroid use   Risk factors comment:  ASA use   Past Medical History  Diagnosis Date  . Diabetes mellitus   . Hypertension   . Atrial flutter   . Obesity   . COPD (chronic obstructive pulmonary disease)   . Gout   . CHF (congestive heart failure)   . Anemia   . Chronic kidney disease     Hemo M- W F.    Past Surgical History  Procedure Laterality Date  . Joint replacement      rt hip  . Arteriovenous graft placement  2008    left arm  . Av fistula placement Left 07/02/2013    Procedure:  INSERTION OF ARTERIOVENOUS (AV) GORE-TEX GRAFT ARM;  Surgeon: Chuck Hint, MD;  Location: [facility] OR;  Service: Vascular;  Laterality: Left;  . Ligation arteriovenous gortex graft Left 07/02/2013    Procedure: LIGATION ARTERIOVENOUS GORTEX GRAFT;  Surgeon: Chuck Hint, MD;  Location: [facility] OR;  Service: Vascular;  Laterality: Left;  . Insertion of dialysis catheter N/A 07/02/2013    Procedure: INSERTION OF DIALYSIS CATHETER;  Surgeon: Chuck Hint, MD;  Location: [facility] OR;  Service: Vascular;  Laterality: N/A;  Left Internal Jugular Placement  . Orif femur fracture Right 10/01/2013    Procedure: OPEN REDUCTION INTERNAL FIXATION (ORIF) DISTAL FEMUR FRACTURE;  Surgeon: Mable Paris, MD;  Location: [facility] OR;  Service: Orthopedics;  Laterality: Right;   Family History  Problem Relation Age of Onset  . Heart attack Mother   . Heart attack Father   . Diabetes Sister   . Diabetes Brother    History  Substance Use Topics  . Smoking status: Former Smoker    Types: Cigarettes    Quit date: 12/19/1984  . Smokeless tobacco: Not on file  . Alcohol Use: No    Review of Systems  Constitutional: Positive for fatigue. Negative for fever, activity change and appetite change.  HENT: Negative for congestion, facial swelling, nosebleeds, rhinorrhea and trouble swallowing.   Eyes: Negative for photophobia and pain.  Respiratory: Negative for cough, chest tightness and shortness of  breath.   Cardiovascular: Negative for chest pain and leg swelling.  Gastrointestinal: Positive for diarrhea, blood in stool and hematochezia. Negative for nausea, vomiting, abdominal pain, constipation and hematemesis.  Endocrine: Negative for polydipsia and polyuria.  Genitourinary: Negative for dysuria, urgency, decreased urine volume and difficulty urinating.  Musculoskeletal: Negative for back pain and gait problem.  Skin: Negative for color change, rash and wound.  Allergic/Immunologic: Negative  for immunocompromised state.  Neurological: Negative for dizziness, loss of consciousness, facial asymmetry, speech difficulty, weakness, light-headedness, numbness and headaches.  Psychiatric/Behavioral: Negative for confusion, decreased concentration and agitation.    Allergies  Penicillins; Sulfa antibiotics; and Glucophage  Home Medications   No current outpatient prescriptions on file. BP 101/63  Pulse 87  Temp(Src) 99 F (37.2 C) (Oral)  Resp 22  Ht 6\' 1"  (1.854 m)  Wt 223 lb 6.4 oz (101.334 kg)  BMI 29.48 kg/m2  SpO2 95% Physical Exam  Constitutional: He is oriented to person, place, and time. He appears well-developed and well-nourished. No distress.  HENT:  Head: Normocephalic and atraumatic.  Mouth/Throat: No oropharyngeal exudate.  Eyes: Pupils are equal, round, and reactive to light.  Neck: Normal range of motion. Neck supple.  Cardiovascular: Normal rate, regular rhythm and normal heart sounds.  Exam reveals no gallop and no friction rub.   No murmur heard. Pulmonary/Chest: Effort normal and breath sounds normal. No respiratory distress. He has no wheezes. He has no rales.  Abdominal: Soft. Bowel sounds are normal. He exhibits no distension and no mass. There is no tenderness. There is no rebound and no guarding.  Genitourinary: Rectum normal.     Dark stool  Musculoskeletal: Normal range of motion. He exhibits no edema and no tenderness.       Legs: Well healing surgical scar R lateral thigh  Neurological: He is alert and oriented to person, place, and time.  Skin: Skin is warm and dry. There is pallor.  Psychiatric: He has a normal mood and affect.    ED Course  Procedures (including critical care time) Labs Review Labs Reviewed  CBC WITH DIFFERENTIAL - Abnormal; Notable for the following:    RBC 2.10 (*)    Hemoglobin 6.6 (*)    HCT 20.5 (*)    RDW 19.3 (*)    All other components within normal limits  COMPREHENSIVE METABOLIC PANEL - Abnormal;  Notable for the following:    Potassium 3.4 (*)    Chloride 94 (*)    Glucose, Bld 113 (*)    BUN 37 (*)    Creatinine, Ser 4.51 (*)    Albumin 3.1 (*)    Alkaline Phosphatase 136 (*)    GFR calc non Af Amer 12 (*)    GFR calc Af Amer 14 (*)    All other components within normal limits  GLUCOSE, CAPILLARY - Abnormal; Notable for the following:    Glucose-Capillary 109 (*)    All other components within normal limits  OCCULT BLOOD, POC DEVICE - Abnormal; Notable for the following:    Fecal Occult Bld POSITIVE (*)    All other components within normal limits  MRSA PCR SCREENING  LACTIC ACID, PLASMA  CBC  COMPREHENSIVE METABOLIC PANEL  CBC  TYPE AND SCREEN  PREPARE RBC (CROSSMATCH)   Imaging Review No results found.  EKG Interpretation     Ventricular Rate:    PR Interval:    QRS Duration:   QT Interval:    QTC Calculation:   R Axis:  Text Interpretation:              MDM   1. Lower GI bleed   2. Anemia   3. Acute post-hemorrhagic anemia   4. GI bleed   5. Heme positive stool    Pt is a 72 y.o. male with Pmhx as above who presents with dark heme + stool yesterday at rehab facility and Hb drop to 6.2 from labs drawn at HD yesterday.  Pt denies CP, SOB, h/a, ab pain, palpitations, hematuria, bleeding gums. On PE, VSS (one low BP in triage), in NAD.  Abdominal exam benign.  Stool dark but not melanotic.  W/U shows Hb 6.6.  Will transfuse 1U PRBCs.  Suspect lower GI bleeding given lack of ab pain, n/v.  Pt has had no stool here, BP persistently around 100 systolic.  Will consult triad for admission. GI consulted by [facility] per triad request.         Shanna Cisco, MD 10/24/13 1610  Shanna Cisco, MD 10/24/13 [phone]

## 2013-10-24 NOTE — H&P (Signed)
[facility] History and Physical  [name] MWN:[id number] DOB: 1941-02-05 DOA: 10/24/2013  Referring physician: Dr. Micheline Maze PCP: Pearson Grippe, MD  Specialists: GI, Renal  Chief Complaint: dark stools  HPI: [name] is a 72 y.o. male has a past medical history significant for end-stage renal disease on hemodialysis Monday Wednesday and Fridays, insulin-dependent diabetes mellitus, hypertension, remote history of atrial flutter, gout, was recently hospitalized about 3 weeks ago for right distal femoral fracture status post ORIF currently nonweightbearing, discharged to skilled nursing facility. On Tuesday, the nursing staff observed some black stools, patient underwent a CBC which showed a hemoglobin of 6.2. He was hence directed to the ED for potential GI bleed. Patient denies any symptoms currently, he denies any chest pain, shortness of breath, denies any abdominal pain nausea or vomiting. He endorses few episodes of diarrhea. He denies any palpitations, denies any lightheadedness or dizziness. He feels at baseline. Following discharge, he was on aspirin 325 mg twice a day for DVT prophylaxis. He reports compliance with his medication and compliance with his dialysis regimen. In the emergency room, labs are pertinent for hemoglobin of 6.6, BUN of 37 and creatinine 4.5. FOBT is positive.  Review of Systems: As per history of present illness, otherwise negative  Past Medical History  Diagnosis Date  . Diabetes mellitus   . Hypertension   . Atrial flutter   . Obesity   . COPD (chronic obstructive pulmonary disease)   . Gout   . CHF (congestive heart failure)   . Anemia   . Chronic kidney disease     Hemo M- W F.    Past Surgical History  Procedure Laterality Date  . Joint replacement      rt hip  . Arteriovenous graft placement  2008    left arm  . Av fistula placement Left 07/02/2013    Procedure: INSERTION OF ARTERIOVENOUS (AV) GORE-TEX GRAFT ARM;  Surgeon:  Chuck Hint, MD;  Location: [facility] OR;  Service: Vascular;  Laterality: Left;  . Ligation arteriovenous gortex graft Left 07/02/2013    Procedure: LIGATION ARTERIOVENOUS GORTEX GRAFT;  Surgeon: Chuck Hint, MD;  Location: [facility] OR;  Service: Vascular;  Laterality: Left;  . Insertion of dialysis catheter N/A 07/02/2013    Procedure: INSERTION OF DIALYSIS CATHETER;  Surgeon: Chuck Hint, MD;  Location: [facility] OR;  Service: Vascular;  Laterality: N/A;  Left Internal Jugular Placement  . Orif femur fracture Right 10/01/2013    Procedure: OPEN REDUCTION INTERNAL FIXATION (ORIF) DISTAL FEMUR FRACTURE;  Surgeon: Mable Paris, MD;  Location: [facility] OR;  Service: Orthopedics;  Laterality: Right;   Social History:  reports that he quit smoking about 28 years ago. His smoking use included Cigarettes. He smoked 0.00 packs per day. He does not have any smokeless tobacco history on file. He reports that he does not drink alcohol or use illicit drugs.  Allergies  Allergen Reactions  . Penicillins Rash  . Sulfa Antibiotics Rash  . Glucophage [Metformin Hydrochloride] Rash    Family History  Problem Relation Age of Onset  . Heart attack Mother   . Heart attack Father   . Diabetes Sister   . Diabetes Brother    Prior to Admission medications   Medication Sig Start Date End Date Taking? Authorizing Provider  allopurinol (ZYLOPRIM) 300 MG tablet Take 150 mg by mouth daily.    Yes Historical Provider, MD  aspirin EC 325 MG tablet Take 325 mg by mouth 2 (two) times  daily.   Yes Historical Provider, MD  fish oil-omega-3 fatty acids 1000 MG capsule Take 1 g by mouth daily.   Yes Historical Provider, MD  HYDROcodone-acetaminophen (NORCO/VICODIN) 5-325 MG per tablet Take 1 tablet by mouth every 8 (eight) hours as needed for moderate pain.   Yes Historical Provider, MD  insulin aspart (NOVOLOG) 100 UNIT/ML injection Inject 0-5 Units into the skin 4 (four) times daily -  before meals and at  bedtime. 0 -150 = 0 units,   151+= 5 units   Yes Historical Provider, MD  multivitamin (RENA-VIT) TABS tablet Take 1 tablet by mouth daily.   Yes Historical Provider, MD  Nutritional Supplements (FEEDING SUPPLEMENT, NEPRO CARB STEADY,) LIQD Take 237 mLs by mouth 3 (three) times a week. On Monday, Wednesday, and Friday   Yes Historical Provider, MD  vitamin C (ASCORBIC ACID) 500 MG tablet Take 500 mg by mouth daily.   Yes Historical Provider, MD  aspirin EC 81 MG tablet Take 81 mg by mouth daily.    Historical Provider, MD   Physical Exam: Filed Vitals:   10/24/13 1402 10/24/13 1430 10/24/13 1557 10/24/13 1600  BP:  111/45 101/41   Pulse:   89 95  Temp: 98.1 F (36.7 C)   98.1 F (36.7 C)  TempSrc: Oral     Resp:   18 18  SpO2:   99%      General:  No apparent distress, pleasant Caucasian male  Eyes: PERRL, EOMI, no scleral icterus  ENT: moist oropharynx  Neck: supple, no JVD  Cardiovascular: regular rate without MRG; 2+ peripheral pulses  Respiratory: CTA biL, good air movement without wheezing, rhonchi or crackles  Abdomen: soft, non tender to palpation, positive bowel sounds, no guarding, no rebound  Skin: no rashes  Musculoskeletal: no peripheral edema; surgical site on the right thigh well-healed, no bleeding, no discharges.  Psychiatric: normal mood and affect  Neurologic: Grossly nonfocal  Labs on Admission:  Basic Metabolic Panel:  Recent Labs Lab 10/24/13 1435  NA 138  K 3.4*  CL 94*  CO2 28  GLUCOSE 113*  BUN 37*  CREATININE 4.51*  CALCIUM 9.9   Liver Function Tests:  Recent Labs Lab 10/24/13 1435  AST 24  ALT 20  ALKPHOS 136*  BILITOT 0.4  PROT 6.6  ALBUMIN 3.1*   CBC:  Recent Labs Lab 10/24/13 1435  WBC 9.4  NEUTROABS 7.0  HGB 6.6*  HCT 20.5*  MCV 97.6  PLT 280    CBG:  Recent Labs Lab 10/24/13 1520  GLUCAP 109*   EKG: Independently reviewed. 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 IV Protonix. Gastroenterology has been consulted by the ED physician. Keep patient n.p.o. Small 250 cc bolus in the ED. Hemoglobin 6.6, will transfuse one unit and repeat CBC post transfusion and again in the morning. Appreciate gastroenterology input. - Goal hemoglobin greater than 7 End-stage renal disease - nephrology has been notified by patient's arrival. He has a scheduled dialysis tomorrow. Per patient his dry weight is 106, he is 108.5 currently. Diabetes mellitus - sliding scale insulin. Patient is currently  n.p.o. Glucose 113 on arrival. Right distal femoral fracture - status post ORIF mid October. Strict bedrest. Continue nonweightbearing status. Hypertension - documented historically. Patient is not on any antihypertensives. He was on Coreg in the past but not currently. Atrial flutter - documented historically. Patient is in sinus rhythm. EKG review in our system shows only sinus rhythm as far as 2013.  Diet: NPO Fluids: none standing DVT Prophylaxis: SCD  Code Status: Full  Family Communication: none  Disposition Plan: inpatient.  Time spent: 52  Jenavieve Freda M. Elvera Lennox, MD [facility] Pager [phone]  If 7PM-7AM, please contact night-coverage [email or web address] Password [email or web address] 10/24/2013, 4:16 PM

## 2013-10-24 NOTE — ED Notes (Signed)
Pt reports he broke his Right knee 3 months ago and is now currently in rehab. Pt reports he was using the bed pan yesterday and the staff informed him he had bloody stool, pt denies seeing his stool but did over hear the staff say it was dark color. Pt does states he feels fatigue. Pt denies chest pain or SOB. Pt is a dialysis pt and receives dialysis treatment M, W, F, states he received a full treatment yesterday.

## 2013-10-24 NOTE — Consult Note (Signed)
Patient seen, examined, and I agree with the above documentation, including the assessment and plan. Agree with EGD in the am.  Hemodynamics stable now, getting 1st unit of pRBC now. Begin PPI

## 2013-10-24 NOTE — Progress Notes (Addendum)
Pt arrived to the unit via stretcher from the ED. Pt has blood going at 150.cc/hr.VS were taken and pt's temperature increased from 97.8 to 99 with in an hour while receiving blood. Pt denies any discomfort. No visible reactions noted. MD made aware. No new orders received. Will cont to monitor.

## 2013-10-24 NOTE — ED Notes (Signed)
PIV placed by EMS to R AC infiltrated when flushed with NS.  Catheter removed and intact.

## 2013-10-25 ENCOUNTER — Encounter (HOSPITAL_COMMUNITY)
Admission: EM | Disposition: A | Discharge status: Skilled Nursing Facility | Payer: Self-pay | Source: Home / Self Care | Attending: Internal Medicine

## 2013-10-25 ENCOUNTER — Encounter (HOSPITAL_COMMUNITY): Payer: Self-pay

## 2013-10-25 DIAGNOSIS — N186 End stage renal disease: Secondary | ICD-10-CM

## 2013-10-25 DIAGNOSIS — E1129 Type 2 diabetes mellitus with other diabetic kidney complication: Secondary | ICD-10-CM

## 2013-10-25 DIAGNOSIS — N058 Unspecified nephritic syndrome with other morphologic changes: Secondary | ICD-10-CM

## 2013-10-25 DIAGNOSIS — K264 Chronic or unspecified duodenal ulcer with hemorrhage: Secondary | ICD-10-CM

## 2013-10-25 DIAGNOSIS — E1165 Type 2 diabetes mellitus with hyperglycemia: Secondary | ICD-10-CM

## 2013-10-25 HISTORY — PX: ESOPHAGOGASTRODUODENOSCOPY: SHX5428

## 2013-10-25 LAB — COMPREHENSIVE METABOLIC PANEL
ALT: 22 U/L (ref 0–53)
AST: 24 U/L (ref 0–37)
Albumin: 3 g/dL — ABNORMAL LOW (ref 3.5–5.2)
Alkaline Phosphatase: 141 U/L — ABNORMAL HIGH (ref 39–117)
CO2: 27 mEq/L (ref 19–32)
Chloride: 94 mEq/L — ABNORMAL LOW (ref 96–112)
Creatinine, Ser: 5.74 mg/dL — ABNORMAL HIGH (ref 0.50–1.35)
GFR calc non Af Amer: 9 mL/min — ABNORMAL LOW (ref >90)
Potassium: 3.7 mEq/L (ref 3.5–5.1)
Sodium: 136 mEq/L (ref 135–145)
Total Bilirubin: 0.4 mg/dL (ref 0.3–1.2)

## 2013-10-25 LAB — CBC
MCH: 30.6 pg (ref 26.0–34.0)
MCHC: 31.7 g/dL (ref 30.0–36.0)
MCV: 96.5 fL (ref 78.0–100.0)
Platelets: 259 10*3/uL (ref 150–400)
RBC: 2.29 MIL/uL — ABNORMAL LOW (ref 4.22–5.81)
RDW: 19.9 % — ABNORMAL HIGH (ref 11.5–15.5)
WBC: 8.2 10*3/uL (ref 4.0–10.5)

## 2013-10-25 LAB — PREPARE RBC (CROSSMATCH)

## 2013-10-25 LAB — GLUCOSE, CAPILLARY
Glucose-Capillary: 152 mg/dL — ABNORMAL HIGH (ref 70–99)
Glucose-Capillary: 148 mg/dL — ABNORMAL HIGH (ref 70–99)

## 2013-10-25 SURGERY — EGD (ESOPHAGOGASTRODUODENOSCOPY)
Anesthesia: Moderate Sedation

## 2013-10-25 MED ORDER — DARBEPOETIN ALFA-POLYSORBATE 200 MCG/0.4ML IJ SOLN
200.0000 ug | INTRAMUSCULAR | Status: DC
  Administered 2013-10-25: 200 ug via INTRAVENOUS
  Filled 2013-10-25: qty 0.4

## 2013-10-25 MED ORDER — MIDAZOLAM HCL 5 MG/ML IJ SOLN
INTRAMUSCULAR | Status: AC
  Filled 2013-10-25: qty 1

## 2013-10-25 MED ORDER — DOXERCALCIFEROL 4 MCG/2ML IV SOLN
6.0000 ug | INTRAVENOUS | Status: DC
  Administered 2013-10-25 – 2013-10-30 (×3): 6 ug via INTRAVENOUS
  Filled 2013-10-25 (×3): qty 4

## 2013-10-25 MED ORDER — RENA-VITE PO TABS
1.0000 | ORAL_TABLET | Freq: Every day | ORAL | Status: DC
  Administered 2013-10-25 – 2013-10-26 (×2): 1 via ORAL
  Administered 2013-10-27: 17:00:00 via ORAL
  Administered 2013-10-28 – 2013-10-29 (×2): 1 via ORAL
  Filled 2013-10-25 (×6): qty 1

## 2013-10-25 MED ORDER — MIDAZOLAM HCL 10 MG/2ML IJ SOLN
INTRAMUSCULAR | Status: DC | PRN
  Administered 2013-10-25 (×4): 1 mg via INTRAVENOUS

## 2013-10-25 MED ORDER — SODIUM CHLORIDE 0.9 % IJ SOLN
PREFILLED_SYRINGE | INTRAMUSCULAR | Status: DC | PRN
  Administered 2013-10-25: 09:00:00

## 2013-10-25 MED ORDER — FENTANYL CITRATE 0.05 MG/ML IJ SOLN
INTRAMUSCULAR | Status: DC | PRN
Start: 2013-10-25 — End: 2013-10-25
  Administered 2013-10-25 (×2): 25 ug via INTRAVENOUS

## 2013-10-25 MED ORDER — HYDROCODONE-ACETAMINOPHEN 5-325 MG PO TABS
ORAL_TABLET | ORAL | Status: AC
  Administered 2013-10-25: 1 via ORAL
  Filled 2013-10-25: qty 1

## 2013-10-25 MED ORDER — BUTAMBEN-TETRACAINE-BENZOCAINE 2-2-14 % EX AERO
INHALATION_SPRAY | CUTANEOUS | Status: DC | PRN
  Administered 2013-10-25: 1 via TOPICAL

## 2013-10-25 MED ORDER — FENTANYL CITRATE 0.05 MG/ML IJ SOLN
INTRAMUSCULAR | Status: AC
  Filled 2013-10-25: qty 2

## 2013-10-25 MED ORDER — DOXERCALCIFEROL 4 MCG/2ML IV SOLN
INTRAVENOUS | Status: AC
  Administered 2013-10-25: 6 ug via INTRAVENOUS
  Filled 2013-10-25: qty 4

## 2013-10-25 MED ORDER — BOOST / RESOURCE BREEZE PO LIQD
1.0000 | Freq: Two times a day (BID) | ORAL | Status: DC
  Administered 2013-10-26 – 2013-10-28 (×4): 1 via ORAL

## 2013-10-25 MED ORDER — EPINEPHRINE HCL 0.1 MG/ML IJ SOSY
PREFILLED_SYRINGE | INTRAMUSCULAR | Status: AC
  Filled 2013-10-25: qty 10

## 2013-10-25 MED ORDER — DARBEPOETIN ALFA-POLYSORBATE 200 MCG/0.4ML IJ SOLN
INTRAMUSCULAR | Status: AC
  Administered 2013-10-25: 200 ug via INTRAVENOUS
  Filled 2013-10-25: qty 0.4

## 2013-10-25 NOTE — Progress Notes (Signed)
Utilization review completed.  
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
## 2013-10-25 NOTE — Op Note (Signed)
[facility] [address] [place], [address]   ENDOSCOPY PROCEDURE REPORT  PATIENT: [name], [name]  MR#: [id number] BIRTHDATE: January 14, 1941 , 72  yrs. old GENDER: Male ENDOSCOPIST: Beverley Fiedler, MD REFERRED BY:  [facility] PROCEDURE DATE:  10/25/2013 PROCEDURE:  EGD w/ control of bleeding and EGD w/ directed submucosal injection(s), any substance ASA CLASS:     Class III INDICATIONS:  Acute post hemorrhagic anemia.   Heme positive stool.  MEDICATIONS: These medications were titrated to patient response per physician's verbal order, Fentanyl 50 mcg IV, and Versed 4 mg IV TOPICAL ANESTHETIC: Cetacaine Spray  DESCRIPTION OF PROCEDURE: After the risks benefits and alternatives of the procedure were thoroughly explained, informed consent was obtained.  The Pentax Gastroscope [id number] endoscope was introduced through the mouth and advanced to the second portion of the duodenum. Without limitations.  The instrument was slowly withdrawn as the mucosa was fully examined.      ESOPHAGUS: There was evidence of suspected Barrett's esophagus in the lower third of the esophagus (approximate 3 cm segment). Biopsies not performed due to recent upper GI bleeding.   The esophagus was otherwise normal.  STOMACH: The mucosa of the stomach appeared normal.   There was a small amount of blood refluxing from the duodenal bulb seen in the pyloric channel.  DUODENUM: Four ulcers, ranging between 5-12 mm in size, one with a pigmented spot were found in the duodenal bulb and D1/D2 junction. Epinephrine injection performed in 4 quadrants around the ulcer with the pigmented spot. Gold probe cautery was applied to the site for 5-10 secs using 15 watts power.  Firm pressure was applied to the cautery site with good treatment effect.  Retroflexed views revealed no abnormalities.     The scope was then withdrawn from the patient and the procedure  completed.  COMPLICATIONS: There were no complications.  ENDOSCOPIC IMPRESSION: 1.   There was evidence of suspected Barrett's esophagus 2.   The esophagus was otherwise normal. 3.   The mucosa of the stomach appeared normal 4.   Four ulcers, ranging between 5-12 mm in size, found in the duodenal bulb; Epinephrine injection and gold probe cautery, with good treatment effect  RECOMMENDATIONS: 1.  IV PPI twice daily for today.  Then okay to switch to oral twice daily for at least 8 weeks. 2.  Avoid NSAIDs 3.  Check H.  Pylori serology and treat if positive 4.  Closely monitor Hgb/HCT.  Plans for additional unit of pRBC today as ordered 5.  Consider repeat EGD in 8-12 weeks to document healing and biopsy to evaluate for Barrett's esophagus  eSigned:  Beverley Fiedler, MD 10/25/2013 9:33 AM   CC:The Patient  PATIENT NAME:  [name] MR#: [id number]

## 2013-10-25 NOTE — Progress Notes (Signed)
[facility] PROGRESS NOTE  OLANDER FRIEDL NWG:[id number] DOB: 1941-02-11 DOA: 10/24/2013 PCP: Pearson Grippe, MD  Assessment/Plan: 1. Upper GI Bleed. Patient undergoing endoscopy today found to have four ulcers in the duodenal bulb, status post injection with epinephrine and cautery. Has not had further episodes of black tarry stools. GI recommending IV protonix BID with plans to switch to PO protonix.  2. Acute Blood Loss Anemia. Was transfused with 2 units of PRBC's, patient presenting with Hg of 6.6. Will repeat AM CBC 3. ESRD. Nephrology consult. Having HD today. 4. Diabetes Mellitus. Continue accuchecks q ac q hs with SSI.   Code Status: full code Disposition Plan: Plan to transition to oral PPI tomorrow. Repeat CBC   Consultants:  Nephrology  GI  Procedures:  EGD    HPI/Subjective: States feeling better today, no further episodes of black tarry stools.   Objective: Filed Vitals:   10/25/13 1700  BP: 86/43  Pulse: 72  Temp:   Resp:     Intake/Output Summary (Last 24 hours) at 10/25/13 1729 Last data filed at 10/25/13 0952  Gross per 24 hour  Intake    925 ml  Output      0 ml  Net    925 ml   Filed Weights   10/24/13 2120 10/25/13 1354 10/25/13 1400  Weight: 101.197 kg (223 lb 1.6 oz) 103.2 kg (227 lb 8.2 oz) 103.2 kg (227 lb 8.2 oz)    Exam:  General: No distress, being seen in HD. Denies pain, SOB, of bloody stools Eyes: PERRL, EOMI, no scleral icterus  Cardiovascular: regular rate without MRG; 2+ peripheral pulses  Respiratory: CTA biL, good air movement without wheezing, rhonchi or crackles  Abdomen: soft, non tender to palpation, positive bowel sounds, no guarding, no rebound  Musculoskeletal: no peripheral edema; surgical site on the right thigh well-healed, no bleeding, no discharges.  Neurologic: Grossly nonfocal   Data Reviewed: Basic Metabolic Panel:  Recent Labs Lab 10/24/13 1435 10/25/13 0605  NA 138 136  K 3.4* 3.7  CL 94* 94*   CO2 28 27  GLUCOSE 113* 162*  BUN 37* 47*  CREATININE 4.51* 5.74*  CALCIUM 9.9 9.6   Liver Function Tests:  Recent Labs Lab 10/24/13 1435 10/25/13 0605  AST 24 24  ALT 20 22  ALKPHOS 136* 141*  BILITOT 0.4 0.4  PROT 6.6 6.4  ALBUMIN 3.1* 3.0*   No results found for this basename: LIPASE, AMYLASE,  in the last 168 hours No results found for this basename: AMMONIA,  in the last 168 hours CBC:  Recent Labs Lab 10/24/13 1435 10/24/13 2015 10/25/13 0605  WBC 9.4 9.0 8.2  NEUTROABS 7.0  --   --   HGB 6.6* 6.9* 7.0*  HCT 20.5* 21.2* 22.1*  MCV 97.6 95.1 96.5  PLT 280 242 259   Cardiac Enzymes: No results found for this basename: CKTOTAL, CKMB, CKMBINDEX, TROPONINI,  in the last 168 hours BNP (last 3 results) No results found for this basename: PROBNP,  in the last 8760 hours CBG:  Recent Labs Lab 10/24/13 1520 10/24/13 2117 10/25/13 0728 10/25/13 1210  GLUCAP 109* 138* 167* 152*    Recent Results (from the past 240 hour(s))  MRSA PCR SCREENING     Status: None   Collection Time    10/24/13  6:02 PM      Result Value Range Status   MRSA by PCR NEGATIVE  NEGATIVE Final   Comment:  The GeneXpert MRSA Assay (FDA     approved for NASAL specimens     only), is one component of a     comprehensive MRSA colonization     surveillance program. It is not     intended to diagnose MRSA     infection nor to guide or     monitor treatment for     MRSA infections.     Studies: No results found.  Scheduled Meds: . darbepoetin (ARANESP) injection - DIALYSIS  200 mcg Intravenous Q Fri-HD  . doxercalciferol  6 mcg Intravenous Q M,W,F-HD  . feeding supplement (RESOURCE BREEZE)  1 Container Oral BID BM  . insulin aspart  0-9 Units Subcutaneous TID WC  . multivitamin  1 tablet Oral QHS  . pantoprazole (PROTONIX) IV  40 mg Intravenous Q12H  . sodium chloride  3 mL Intravenous Q12H  . sodium chloride  3 mL Intravenous Q12H   Continuous Infusions: . sodium  chloride 20 mL/hr at 10/24/13 1858    Principal Problem:   GI bleed Active Problems:   End stage renal disease   IDDM (insulin dependent diabetes mellitus)   Fracture of femur, distal, right, closed   Unspecified essential hypertension   DM (diabetes mellitus) type II uncontrolled with renal manifestation   S/P ORIF (open reduction internal fixation) fracture-right distal femoral fracture   Anemia   Duodenal ulcer with hemorrhage    Time spent: 35 minutes    Jeralyn Bennett  [facility] Pager [phone]. If 7PM-7AM, please contact night-coverage at [email or web address], password [facility] 10/25/2013, 5:29 PM  LOS: 1 day

## 2013-10-26 DIAGNOSIS — D649 Anemia, unspecified: Secondary | ICD-10-CM

## 2013-10-26 LAB — GLUCOSE, CAPILLARY
Glucose-Capillary: 134 mg/dL — ABNORMAL HIGH (ref 70–99)
Glucose-Capillary: 151 mg/dL — ABNORMAL HIGH (ref 70–99)
Glucose-Capillary: 169 mg/dL — ABNORMAL HIGH (ref 70–99)

## 2013-10-26 LAB — CBC
HCT: 24.6 % — ABNORMAL LOW (ref 39.0–52.0)
MCHC: 32.9 g/dL (ref 30.0–36.0)
Platelets: 250 10*3/uL (ref 150–400)
RDW: 19.6 % — ABNORMAL HIGH (ref 11.5–15.5)

## 2013-10-26 MED ORDER — POLYETHYLENE GLYCOL 3350 17 G PO PACK
17.0000 g | PACK | Freq: Every day | ORAL | Status: DC
  Administered 2013-10-26 – 2013-10-29 (×3): 17 g via ORAL
  Filled 2013-10-26 (×4): qty 1

## 2013-10-26 MED ORDER — LANTHANUM CARBONATE 500 MG PO CHEW
1000.0000 mg | CHEWABLE_TABLET | Freq: Three times a day (TID) | ORAL | Status: DC
  Administered 2013-10-26 – 2013-10-30 (×8): 1000 mg via ORAL
  Filled 2013-10-26 (×15): qty 2

## 2013-10-26 MED ORDER — LACTULOSE 10 GM/15ML PO SOLN
20.0000 g | Freq: Two times a day (BID) | ORAL | Status: DC | PRN
  Filled 2013-10-26: qty 30

## 2013-10-26 MED ORDER — SUCRALFATE 1 G PO TABS
1.0000 g | ORAL_TABLET | Freq: Three times a day (TID) | ORAL | Status: DC
  Administered 2013-10-26 – 2013-10-30 (×13): 1 g via ORAL
  Filled 2013-10-26 (×20): qty 1

## 2013-10-26 MED ORDER — DOCUSATE SODIUM 100 MG PO CAPS
100.0000 mg | ORAL_CAPSULE | Freq: Two times a day (BID) | ORAL | Status: DC
  Administered 2013-10-26 – 2013-10-30 (×7): 100 mg via ORAL
  Filled 2013-10-26 (×10): qty 1

## 2013-10-26 MED ORDER — PANTOPRAZOLE SODIUM 40 MG PO TBEC
40.0000 mg | DELAYED_RELEASE_TABLET | Freq: Two times a day (BID) | ORAL | Status: DC
  Administered 2013-10-26 (×2): 40 mg via ORAL
  Filled 2013-10-26 (×2): qty 1

## 2013-10-26 NOTE — Progress Notes (Signed)
[facility] Progress Note  Subjective:   Sitting up on side of the bed. Energy improving. Ate a full breakfast. Anxious to go home.  Objective Filed Vitals:   10/25/13 1754 10/25/13 1832 10/25/13 2056 10/26/13 0551  BP: 134/45 99/60 103/56 107/56  Pulse: 75 75 80 79  Temp: 97.5 F (36.4 C) 98 F (36.7 C) 98.1 F (36.7 C) 98.1 F (36.7 C)  TempSrc: Oral Oral Oral Oral  Resp: 18 18 18 18   Height:   6\' 1"  (1.854 m)   Weight: 101.2 kg (223 lb 1.7 oz)  100.925 kg (222 lb 8 oz)   SpO2: 97% 96% 92% 90%   Physical Exam General: Elderly, chronically ill-appearing, NAD Heart: RRR Lungs: CTA bilat, decreased at bases. No wheezes or rhonchi Abdomen: Obese, soft, NT, normal BS Extremities: +LE edema improving. SCD's present Dialysis Access: L AVG + Bruit.  Dialysis Orders: Center: [place] on MWF.  EDW 106 kg HD Bath 2K/2Ca Time 4:00 Heparin 9000 u. Access L AVG BFR 450 DFR A1.5  Hectorol 6 mcg IV/HD Epogen 10,000 Units IV/HD Venofer 0  Recent labs: Hgb 6.5 < 8.8 < 9.8, P 6.8, PTH 558  Assessment/Plan: 1. ABLA secondary to GIB - Hgb 8.1 > 6.2 s/p 3 units PRBCs. Heme + stools per per outpatient labs.  EGD showed 4 duodenal ulcers and suspicion for barrett's esophagus. On PPI. +asa, No NSAIDS. H. Pylori panel pending. Aranesp 200 q Friday. Hb 8.1 today, probably will be here another day 2. ESRD - MWF , K + 3.7. Next HD 11/10 3. Hypertension/volume - Under EDW with soft BPs. LE edema improved with HD yesterday. Needs lower edw at d/c Down to 101 kg today, lower EDW at d/c  4. Metabolic bone disease - Ca 9.6 (10.4 corrected.) Phos poorly controlled op on Tums ultra 2 ac. Last PTH 558. Continue low Ca bath and Vit D. Tums d/c'd. Start Fosrenol 1 g TID AC and continue op. 5. Nutrition - Alb 3.0. Eating poorly at SNF (dislikes the food)  Renal, Breeze and multivitamin  6. DM - Insulin per primary  7. Hx Atrial flutter  8. Hx ORIF of right distal femur fx - Admitted 10/14-10/17. Non  wgt bearing. SNF for ~ 5 more weeks per patient  Scot Jun. Thad Ranger [facility] Pager [phone] 10/26/2013,9:22 AM  LOS: 2 days   I have seen and examined patient, discussed with PA and agree with assessment and plan as outlined above with additions as indicated. Vinson Moselle MD pager [phone]    cell [phone] 10/26/2013, 11:42 AM     Additional Objective Labs: Basic Metabolic Panel:  Recent Labs Lab 10/24/13 1435 10/25/13 0605  NA 138 136  K 3.4* 3.7  CL 94* 94*  CO2 28 27  GLUCOSE 113* 162*  BUN 37* 47*  CREATININE 4.51* 5.74*  CALCIUM 9.9 9.6   Liver Function Tests:  Recent Labs Lab 10/24/13 1435 10/25/13 0605  AST 24 24  ALT 20 22  ALKPHOS 136* 141*  BILITOT 0.4 0.4  PROT 6.6 6.4  ALBUMIN 3.1* 3.0*   CBC:  Recent Labs Lab 10/24/13 1435 10/24/13 2015 10/25/13 0605 10/26/13 0610  WBC 9.4 9.0 8.2 10.2  NEUTROABS 7.0  --   --   --   HGB 6.6* 6.9* 7.0* 8.1*  HCT 20.5* 21.2* 22.1* 24.6*  MCV 97.6 95.1 96.5 94.6  PLT 280 242 259 250   Blood Culture    Component Value Date/Time   SDES  STOOL 10/22/2007 0136   SPECREQUEST  IMMUNE:NORM 10/22/2007 0136   CULT KLEBSIELLA OXYTOCA 10/20/2007 1250   REPTSTATUS 10/23/2007 FINAL 10/22/2007 0136    CBG:  Recent Labs Lab 10/25/13 0728 10/25/13 1210 10/25/13 1836 10/25/13 2055 10/26/13 0755  GLUCAP 167* 152* 111* 148* 134*   Studies/Results: No results found.  Medications: . sodium chloride 20 mL/hr at 10/24/13 1858   . darbepoetin (ARANESP) injection - DIALYSIS  200 mcg Intravenous Q Fri-HD  . doxercalciferol  6 mcg Intravenous Q M,W,F-HD  . feeding supplement (RESOURCE BREEZE)  1 Container Oral BID BM  . insulin aspart  0-9 Units Subcutaneous TID WC  . multivitamin  1 tablet Oral QHS  . pantoprazole (PROTONIX) IV  40 mg Intravenous Q12H  . sodium chloride  3 mL Intravenous Q12H  . sodium chloride  3 mL Intravenous Q12H

## 2013-10-26 NOTE — Progress Notes (Signed)
    Progress Note   Subjective  Feels well.  Eating chicken sandwich for lunch.  No BM, thus no further melena.  Is passing gas No abd pain, no nausea Had HD yesterday Plans to return to rehab, likely tomorrow    Objective  Vital signs in last 24 hours: Temp:  [97.5 F (36.4 C)-98.1 F (36.7 C)] 98.1 F (36.7 C) (11/08 0551) Pulse Rate:  [66-84] 79 (11/08 0551) Resp:  [18-20] 18 (11/08 0551) BP: (86-134)/(36-60) 107/56 mmHg (11/08 0551) SpO2:  [90 %-97 %] 90 % (11/08 0551) Weight:  [222 lb 8 oz (100.925 kg)-227 lb 8.2 oz (103.2 kg)] 222 lb 8 oz (100.925 kg) (11/07 2056) Last BM Date: 10/22/13 Gen: awake, alert, NAD HEENT: anicteric, op clear CV: RRR Pulm: CTA b/l Abd: soft, NT/ND, +BS throughout Ext: no c/c/e Neuro: nonfocal  Intake/Output from previous day: 11/07 0701 - 11/08 0700 In: 600 [I.V.:600] Out: 2000  Intake/Output this shift:    Lab Results:  Recent Labs  10/24/13 2015 10/25/13 0605 10/26/13 0610  WBC 9.0 8.2 10.2  HGB 6.9* 7.0* 8.1*  HCT 21.2* 22.1* 24.6*  PLT 242 259 250   BMET  Recent Labs  10/24/13 1435 10/25/13 0605  NA 138 136  K 3.4* 3.7  CL 94* 94*  CO2 28 27  GLUCOSE 113* 162*  BUN 37* 47*  CREATININE 4.51* 5.74*  CALCIUM 9.9 9.6   LFT  Recent Labs  10/25/13 0605  PROT 6.4  ALBUMIN 3.0*  AST 24  ALT 22  ALKPHOS 141*  BILITOT 0.4      Assessment & Plan  72 yo with ESRD on HD presenting with heme + stools and anemia found to have duodenal ulcer disease  1. Duodenal ulcers -- s/p endoscopic therapy yesterday, no further bleeding.  Hgb improved with pRBC transfusion. He is feeling well and looks well.   --transition to BID PPI for at least 8 weeks --H pylori Ab in process, will treat if positive --avoid NSAIDs --diet previously advanced and tolerating --from GI perspective, okay for d/c   2.  Femur fracture -- stable and heading back to rehab at d/c  Call with questions.   Principal Problem:   GI  bleed Active Problems:   End stage renal disease   IDDM (insulin dependent diabetes mellitus)   Fracture of femur, distal, right, closed   Unspecified essential hypertension   DM (diabetes mellitus) type II uncontrolled with renal manifestation   S/P ORIF (open reduction internal fixation) fracture-right distal femoral fracture   Anemia   Duodenal ulcer with hemorrhage     LOS: 2 days   Drisana Schweickert M  10/26/2013, 12:57 PM

## 2013-10-26 NOTE — Progress Notes (Signed)
[facility] PROGRESS NOTE  [name] ZOX:[id number] DOB: 1941/02/20 DOA: 10/24/2013 PCP: Pearson Grippe, MD  Assessment/Plan: 1. Upper GI Bleed. Undergoing EGD which showed 4 ulcers found in the duodenal bulb. He underwent epinephrine injection as well as probe cautery.Transitioned him from IV Protonix to by mouth protonix 40 mg twice a day today, and will continue this treatment regimen for at least 8 weeks per gastroenterology's recommendations. Patient has not had further episodes of black tarry stools during this hospitalization. 2. Constipation. Patient stated not having a bowel movement since last Tuesday and is concerned about discharge. Will place him on a bowel regimen. Also have encouraged increasing activity. Will follow 3. Acute Blood Loss Anemia. Secondary to upper GI bleed, with patient reporting black tarry stools last Tuesday. Was transfused with a total of 3 units of PRBC's, with repeat hemoglobin improved at 8.1. 4. ESRD. Nephrology consult. Having HD today. 5. Diabetes Mellitus. Continue accuchecks q ac q hs with SSI.   Code Status: full code Disposition Plan: Start patient on bowel regimen, has not had further episodes of GI bleed. Anticipate discharge in the next 24 hours.   Consultants:  Nephrology  GI  Procedures:  EGD    HPI/Subjective: Patient states doing well this morning, tolerated by mouth intake. He expresses his concerns for not having a bowel movement since last Tuesday. He has not had black tarry stools or hematemesis during this hospitalization. Yesterday he underwent EGD where he was found to have duodenal bulb ulcers. He is on Protonix and Carafate.   Objective: Filed Vitals:   10/26/13 0551  BP: 107/56  Pulse: 79  Temp: 98.1 F (36.7 C)  Resp: 18    Intake/Output Summary (Last 24 hours) at 10/26/13 1133 Last data filed at 10/25/13 1754  Gross per 24 hour  Intake      0 ml  Output   2000 ml  Net  -2000 ml   Filed Weights   10/25/13 1400 10/25/13 1754 10/25/13 2056  Weight: 103.2 kg (227 lb 8.2 oz) 101.2 kg (223 lb 1.7 oz) 100.925 kg (222 lb 8 oz)    Exam:  General: No distress, being seen in HD. Denies pain, SOB, of bloody stools Eyes: PERRL, EOMI, no scleral icterus  Cardiovascular: regular rate without MRG; 2+ peripheral pulses  Respiratory: CTA biL, good air movement without wheezing, rhonchi or crackles  Abdomen: soft, non tender to palpation, positive bowel sounds, no guarding, no rebound  Musculoskeletal: no peripheral edema; surgical site on the right thigh well-healed, no bleeding, no discharges.  Neurologic: Grossly nonfocal   Data Reviewed: Basic Metabolic Panel:  Recent Labs Lab 10/24/13 1435 10/25/13 0605  NA 138 136  K 3.4* 3.7  CL 94* 94*  CO2 28 27  GLUCOSE 113* 162*  BUN 37* 47*  CREATININE 4.51* 5.74*  CALCIUM 9.9 9.6   Liver Function Tests:  Recent Labs Lab 10/24/13 1435 10/25/13 0605  AST 24 24  ALT 20 22  ALKPHOS 136* 141*  BILITOT 0.4 0.4  PROT 6.6 6.4  ALBUMIN 3.1* 3.0*   No results found for this basename: LIPASE, AMYLASE,  in the last 168 hours No results found for this basename: AMMONIA,  in the last 168 hours CBC:  Recent Labs Lab 10/24/13 1435 10/24/13 2015 10/25/13 0605 10/26/13 0610  WBC 9.4 9.0 8.2 10.2  NEUTROABS 7.0  --   --   --   HGB 6.6* 6.9* 7.0* 8.1*  HCT 20.5* 21.2* 22.1* 24.6*  MCV 97.6  95.1 96.5 94.6  PLT 280 242 259 250   Cardiac Enzymes: No results found for this basename: CKTOTAL, CKMB, CKMBINDEX, TROPONINI,  in the last 168 hours BNP (last 3 results) No results found for this basename: PROBNP,  in the last 8760 hours CBG:  Recent Labs Lab 10/25/13 0728 10/25/13 1210 10/25/13 1836 10/25/13 2055 10/26/13 0755  GLUCAP 167* 152* 111* 148* 134*    Recent Results (from the past 240 hour(s))  MRSA PCR SCREENING     Status: None   Collection Time    10/24/13  6:02 PM      Result Value Range Status   MRSA by PCR  NEGATIVE  NEGATIVE Final   Comment:            The GeneXpert MRSA Assay (FDA     approved for NASAL specimens     only), is one component of a     comprehensive MRSA colonization     surveillance program. It is not     intended to diagnose MRSA     infection nor to guide or     monitor treatment for     MRSA infections.     Studies: No results found.  Scheduled Meds: . darbepoetin (ARANESP) injection - DIALYSIS  200 mcg Intravenous Q Fri-HD  . docusate sodium  100 mg Oral BID  . doxercalciferol  6 mcg Intravenous Q M,W,F-HD  . feeding supplement (RESOURCE BREEZE)  1 Container Oral BID BM  . insulin aspart  0-9 Units Subcutaneous TID WC  . lanthanum  1,000 mg Oral TID WC  . multivitamin  1 tablet Oral QHS  . pantoprazole  40 mg Oral BID  . polyethylene glycol  17 g Oral Daily  . sodium chloride  3 mL Intravenous Q12H  . sodium chloride  3 mL Intravenous Q12H  . sucralfate  1 g Oral TID WC & HS   Continuous Infusions: . 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 If 7PM-7AM, please contact night-coverage at [email or web address], password [email or web address] 10/26/2013, 11:33 AM  LOS: 2 days

## 2013-10-27 ENCOUNTER — Encounter (HOSPITAL_COMMUNITY): Payer: Self-pay | Admitting: Internal Medicine

## 2013-10-27 ENCOUNTER — Encounter (HOSPITAL_COMMUNITY)
Admission: EM | Disposition: A | Discharge status: Skilled Nursing Facility | Payer: Self-pay | Source: Home / Self Care | Attending: Internal Medicine

## 2013-10-27 DIAGNOSIS — K31819 Angiodysplasia of stomach and duodenum without bleeding: Secondary | ICD-10-CM

## 2013-10-27 HISTORY — PX: ESOPHAGOGASTRODUODENOSCOPY: SHX5428

## 2013-10-27 LAB — CBC
HCT: 20.6 % — ABNORMAL LOW (ref 39.0–52.0)
Hemoglobin: 6.7 g/dL — CL (ref 13.0–17.0)
MCH: 31.5 pg (ref 26.0–34.0)
MCHC: 32.5 g/dL (ref 30.0–36.0)
RDW: 19.8 % — ABNORMAL HIGH (ref 11.5–15.5)
WBC: 11.4 10*3/uL — ABNORMAL HIGH (ref 4.0–10.5)

## 2013-10-27 LAB — GLUCOSE, CAPILLARY: Glucose-Capillary: 150 mg/dL — ABNORMAL HIGH (ref 70–99)

## 2013-10-27 LAB — PREPARE RBC (CROSSMATCH)

## 2013-10-27 SURGERY — EGD (ESOPHAGOGASTRODUODENOSCOPY)
Anesthesia: Moderate Sedation

## 2013-10-27 MED ORDER — EPINEPHRINE HCL 0.1 MG/ML IJ SOSY
PREFILLED_SYRINGE | INTRAMUSCULAR | Status: AC
  Filled 2013-10-27: qty 10

## 2013-10-27 MED ORDER — PANTOPRAZOLE SODIUM 40 MG IV SOLR: 40.0000 mg | Freq: Two times a day (BID) | INTRAVENOUS | Status: DC

## 2013-10-27 MED ORDER — SODIUM CHLORIDE 0.9 % IV SOLN
INTRAVENOUS | Status: DC
  Administered 2013-10-27: 18:00:00 via INTRAVENOUS

## 2013-10-27 MED ORDER — MIDAZOLAM HCL 5 MG/ML IJ SOLN
INTRAMUSCULAR | Status: AC
  Filled 2013-10-27: qty 2

## 2013-10-27 MED ORDER — MIDAZOLAM HCL 10 MG/2ML IJ SOLN
INTRAMUSCULAR | Status: DC | PRN
  Administered 2013-10-27 (×2): 2 mg via INTRAVENOUS

## 2013-10-27 MED ORDER — DIPHENHYDRAMINE HCL 50 MG/ML IJ SOLN
INTRAMUSCULAR | Status: AC
  Filled 2013-10-27: qty 1

## 2013-10-27 MED ORDER — FENTANYL CITRATE 0.05 MG/ML IJ SOLN
INTRAMUSCULAR | Status: AC
  Filled 2013-10-27: qty 4

## 2013-10-27 MED ORDER — SODIUM CHLORIDE 0.9 % IV SOLN
80.0000 mg | Freq: Once | INTRAVENOUS | Status: DC
  Filled 2013-10-27 (×2): qty 80

## 2013-10-27 MED ORDER — FENTANYL CITRATE 0.05 MG/ML IJ SOLN
INTRAMUSCULAR | Status: DC | PRN
  Administered 2013-10-27 (×2): 25 ug via INTRAVENOUS

## 2013-10-27 MED ORDER — SODIUM CHLORIDE 0.9 % IV SOLN
8.0000 mg/h | INTRAVENOUS | Status: DC
  Administered 2013-10-27 – 2013-10-29 (×4): 8 mg/h via INTRAVENOUS
  Filled 2013-10-27 (×8): qty 80

## 2013-10-27 MED ORDER — BUTAMBEN-TETRACAINE-BENZOCAINE 2-2-14 % EX AERO
INHALATION_SPRAY | CUTANEOUS | Status: DC | PRN
  Administered 2013-10-27: 2 via TOPICAL

## 2013-10-27 NOTE — Op Note (Signed)
[name] [facility] [address] [place], [address]   ENDOSCOPY PROCEDURE REPORT  PATIENT: [name], [name]  MR#: [id number] BIRTHDATE: [birth year] , 72  yrs. old GENDER: Male ENDOSCOPIST: Beverley Fiedler, MD REFERRED BY:  [facility] PROCEDURE DATE:  10/27/2013 PROCEDURE:  EGD w/ control of bleeding ASA CLASS:     Class III INDICATIONS:  Acute post hemorrhagic anemia.   Hematochezia. Melena. MEDICATIONS: These medications were titrated to patient response per physician's verbal order, Fentanyl 50 mcg IV, and Versed 4 mg IV TOPICAL ANESTHETIC: Cetacaine Spray  DESCRIPTION OF PROCEDURE: After the risks benefits and alternatives of the procedure were thoroughly explained, informed consent was obtained.  The Pentax Gastroscope [id number] endoscope was introduced through the mouth and advanced to the second portion of the duodenum. Without limitations.  The instrument was slowly withdrawn as the mucosa was fully examined.     ESOPHAGUS: There was evidence of suspected Barrett's esophagus.  STOMACH: There was fluid and food residue in the proximal gastric body.  There was several areas of fresh blood in the body of the stomach without an active bleeding source at the time of this endoscopy.  A small angiodysplastic lesion was found on the greater curvature of the gastric antrum.  A 1 ml 1:10,000 Epinephrine injection was given at the edge of this lesion prior to APC ablation (1.0 L/min, 40W) deliver drugs.  There was minimal blood loss from maneuver subsiding by end of procedure.   APC ablation successful. Two small non-bleeding shallow and clean-based ulcers were found at the incisura and on the greater curvature of the gastric antrum.   A small hiatal hernia was noted.  DUODENUM: Five ulcers were found in the duodenal bulb and D1-D2 junction (3 in the bulb which were clean based and not actively bleeding). The ulcer treated 2 days ago with injection and  gold probe is now clean-based.  There is one large ulcer in the duodenal sweep which is clean based and not actively bleeding. Just past this ulcer, there was a very small linear ulceration containing a visible vessel.  2 cc of epinephrine 1:10,000 was injected circumferentially for hemostasis. To hemostatic clips were then placed under the visible vessel.  Good treatment results no bleeding at the end of the case.  Retroflexed views revealed a hiatal hernia.     The scope was then withdrawn from the patient and the procedure completed.  COMPLICATIONS: There were no complications.  ENDOSCOPIC IMPRESSION: 1.   There was evidence of suspected Barrett's esophagus 2.   Angiodysplastic lesion with no bleeding on the greater curvature of the gastric antrum; Epinephrine injection and APC ablation 3.   Two small non-bleeding ulcers were found at the incisura and on the greater curvature of the gastric antrum 4.   Small hiatal hernia 5.   Five small ulcers were found in the duodenal bulb; One with visible vessel treated with epinephrine injection and hemostatic clip placement as above.  RECOMMENDATIONS: 1.  Given endoscopic therapy today will start PPI infusion 2.  Complete blood transfusion as previously ordered 3.  Monitor hemoglobin closely, transfuse if necessary 4.  Followup Helicobacter pylori antibody, treat if positive  eSigned:  Beverley Fiedler, MD 10/27/2013 1:16 PM   CC:The Patient  PATIENT NAME:  [name], [name] MR#: [id number]

## 2013-10-27 NOTE — Addendum Note (Signed)
Addendum created 10/27/13 1855 by Kipp Brood, MD   Modules edited: Anesthesia Responsible Staff

## 2013-10-27 NOTE — Progress Notes (Signed)
Clinical Social Work Department BRIEF PSYCHOSOCIAL ASSESSMENT 10/27/2013  Patient:  [name], [name]     Account Number:  [id number]     Admit date:  10/24/2013  Clinical Social Worker:  Doree Albee  Date/Time:  10/27/2013 04:19 PM  Referred by:  RN  Date Referred:  10/27/2013 Referred for  SNF Placement   Other Referral:   Interview type:  Patient Other interview type:    PSYCHOSOCIAL DATA Living Status:  FACILITY Admitted from facility:  [facility] [place] Level of care:  Skilled Nursing Facility Primary support name:  betty Flam Primary support relationship to patient:  SPOUSE Degree of support available:   strong    CURRENT CONCERNS Current Concerns  Post-Acute Placement   Other Concerns:    SOCIAL WORK ASSESSMENT / PLAN CSW met with pt and pt spouse at bedside to complete psychosocial assessment with pt permission. Pt shared he is a resident at [facility]. Pt shard he plans to return when medically stable. Pt voiced frustration as he states he was close to discharge however a new bleed has developed. Nurse and pt shared they are ordering more tests. Pt remains motivated to return to short term rehab and reutrn home.   Assessment/plan status:  Psychosocial Support/Ongoing Assessment of Needs Other assessment/ plan:   Information/referral to community resources:   none identified at this time    PATIENT'S/FAMILY'S RESPONSE TO PLAN OF CARE: Pt and pt wife thanked csw for concern and support. Pt motivated to return to [facility] when medically stable.      Catha Gosselin, LCSWA weekend covering CSW .10/27/2013  1621pm [phone]

## 2013-10-27 NOTE — Progress Notes (Signed)
Patient had three bloody stools maroon in color. First bloody stool was large. BP is 97/59 HR is 78. VSS. Erick Blinks MD notified. Will continue to monitor.

## 2013-10-27 NOTE — Progress Notes (Signed)
[facility] PROGRESS NOTE  [name] ZOX:[id number] DOB: 04-May-1941 DOA: 10/24/2013 PCP: Pearson Grippe, MD  Assessment/Plan: 1. Upper GI Bleed. EGD on 10/25/13 showed 4 ulcers found in the duodenal bulb, having epinephrine injection as well as probe cautery. With new bloody bowel movements overnight he underwent repeat endoscopy today with clip placement of of 1 duodenal bulb ulcer as well as epinephrine injection of angiodysplastic lesion. Protonix drip restarted, on carafate.  2. Acute Blood Loss Anemia. Secondary to upper GI bleed, with Hg trending down to 6.7 on this morning's labs. He was transfused with 2 units of PRBC's. . 3. ESRD. Nephrology consult, undergoing HD during this hospitalization. 4. Diabetes Mellitus. Continue accuchecks q ac q hs with SSI.   Code Status: full code Disposition Plan: Start patient on bowel regimen, has not had further episodes of GI bleed. Anticipate discharge in the next 24 hours.   Consultants:  Nephrology  GI  Procedures:  EGD on 10/25/13 and 10/27/13    HPI/Subjective: Unfortunately he had 3 bloody bowel movements overnight, with am labs showing a dropin Hg to 6.7. He was transfused with 2 units as he underwent repeat upper endoscopy.   Objective: Filed Vitals:   10/27/13 1310  BP: 103/33  Pulse: 77  Temp:   Resp: 24    Intake/Output Summary (Last 24 hours) at 10/27/13 1406 Last data filed at 10/27/13 1312  Gross per 24 hour  Intake    525 ml  Output      0 ml  Net    525 ml   Filed Weights   10/25/13 1754 10/25/13 2056 10/26/13 2101  Weight: 101.2 kg (223 lb 1.7 oz) 100.925 kg (222 lb 8 oz) 100.653 kg (221 lb 14.4 oz)    Exam:  General: No distress,  Eyes: PERRL, EOMI, no scleral icterus  Cardiovascular: regular rate without MRG; 2+ peripheral pulses  Respiratory: CTA biL, good air movement without wheezing, rhonchi or crackles  Abdomen: soft, non tender to palpation, positive bowel sounds, no guarding, no rebound   Musculoskeletal: no peripheral edema; surgical site on the right thigh well-healed, no bleeding, no discharges.  Neurologic: Grossly nonfocal   Data Reviewed: Basic Metabolic Panel:  Recent Labs Lab 10/24/13 1435 10/25/13 0605  NA 138 136  K 3.4* 3.7  CL 94* 94*  CO2 28 27  GLUCOSE 113* 162*  BUN 37* 47*  CREATININE 4.51* 5.74*  CALCIUM 9.9 9.6   Liver Function Tests:  Recent Labs Lab 10/24/13 1435 10/25/13 0605  AST 24 24  ALT 20 22  ALKPHOS 136* 141*  BILITOT 0.4 0.4  PROT 6.6 6.4  ALBUMIN 3.1* 3.0*   No results found for this basename: LIPASE, AMYLASE,  in the last 168 hours No results found for this basename: AMMONIA,  in the last 168 hours CBC:  Recent Labs Lab 10/24/13 1435 10/24/13 2015 10/25/13 0605 10/26/13 0610 10/27/13 0625  WBC 9.4 9.0 8.2 10.2 11.4*  NEUTROABS 7.0  --   --   --   --   HGB 6.6* 6.9* 7.0* 8.1* 6.7*  HCT 20.5* 21.2* 22.1* 24.6* 20.6*  MCV 97.6 95.1 96.5 94.6 96.7  PLT 280 242 259 250 246   Cardiac Enzymes: No results found for this basename: CKTOTAL, CKMB, CKMBINDEX, TROPONINI,  in the last 168 hours BNP (last 3 results) No results found for this basename: PROBNP,  in the last 8760 hours CBG:  Recent Labs Lab 10/26/13 1149 10/26/13 1652 10/26/13 2057 10/27/13 0746 10/27/13 1137  GLUCAP 151* 161* 169* 266* 150*    Recent Results (from the past 240 hour(s))  MRSA PCR SCREENING     Status: None   Collection Time    10/24/13  6:02 PM      Result Value Range Status   MRSA by PCR NEGATIVE  NEGATIVE Final   Comment:            The GeneXpert MRSA Assay (FDA     approved for NASAL specimens     only), is one component of a     comprehensive MRSA colonization     surveillance program. It is not     intended to diagnose MRSA     infection nor to guide or     monitor treatment for     MRSA infections.     Studies: No results found.  Scheduled Meds: . darbepoetin (ARANESP) injection - DIALYSIS  200 mcg  Intravenous Q Fri-HD  . docusate sodium  100 mg Oral BID  . doxercalciferol  6 mcg Intravenous Q M,W,F-HD  . feeding supplement (RESOURCE BREEZE)  1 Container Oral BID BM  . insulin aspart  0-9 Units Subcutaneous TID WC  . lanthanum  1,000 mg Oral TID WC  . multivitamin  1 tablet Oral QHS  . pantoprazole (PROTONIX) IV  80 mg Intravenous Once  . [START ON 10/31/2013] pantoprazole (PROTONIX) IV  40 mg Intravenous Q12H  . polyethylene glycol  17 g Oral Daily  . sodium chloride  3 mL Intravenous Q12H  . sodium chloride  3 mL Intravenous Q12H  . sucralfate  1 g Oral TID WC & HS   Continuous Infusions: . sodium chloride 20 mL/hr at 10/24/13 1858  . sodium chloride    . 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 If 7PM-7AM, please contact night-coverage at [email or web address], password [facility] 10/27/2013, 2:06 PM  LOS: 3 days

## 2013-10-27 NOTE — Progress Notes (Signed)
[facility] Progress Note  Subjective:   Fatigued. Up most of the night with bloody BMs. Hunger pains but otherwise in good spirits. Unit 1/2 PRBCs transfusing.  Objective Filed Vitals:   10/26/13 2101 10/27/13 0419 10/27/13 0635 10/27/13 0845  BP: 99/60 97/59 126/66 113/50  Pulse: 80 78 78 82  Temp: 98.5 F (36.9 C) 98 F (36.7 C) 98 F (36.7 C) 97.9 F (36.6 C)  TempSrc: Oral Oral Oral Oral  Resp: 18 18 17 24   Height: 6\' 1"  (1.854 m)     Weight: 100.653 kg (221 lb 14.4 oz)     SpO2: 93% 93% 94% 94%   Physical Exam General: Elderly, alert, cooperative, NAD Heart: RRR, no murmur or rub appreciated Lungs: Grossly clear, decr at bases, no wheezes or rhonchi Abdomen: Obese, soft, NT, nl BS Extremities: Trace LE edema. SCDs in place Dialysis Access: LAVG + bruit  Dialysis Orders: Center: [place] on MWF.  EDW 106 kg HD Bath 2K/2Ca Time 4:00 Heparin 9000 u. Access L AVG BFR 450 DFR A1.5  Hectorol 6 mcg IV/HD Epogen 10,000 Units IV/HD Venofer 0  Recent labs: Hgb 6.5 < 8.8 < 9.8, P 6.8, PTH 558   Assessment/Plan: 1. ABLA secondary to GIB - Hgb 8.1 yesterday > 6.2 on admit s/p 3 units PRBCs. New bloody BMs overnight. Now with significant hgb drop to 6.7. Transfusing 2 add'l units PRBCs now with repeat EGD today per GI. Will continue Aranesp 200 q Friday. Also on Carafate - would consider very short course. Rebleed and repeat EGD today showing duodenal ulcers, one with bleeding vessel which was treated 2. ESRD - MWF , Last K + 3.7. Next HD tomorrow, No heparin 3. Hypertension/volume - Under EDW with soft BPs.  Lower edw at d/c (~101 kg)  4. Metabolic bone disease - Ca 9.6 (10.4 corrected.) Phos poorly controlled op on Tums ultra 2 ac. Last PTH 558. Continue low Ca bath and Vit D. Tums d/c'd. On Fosrenol 1 g TID AC and continue op. Renal panel 5. Nutrition - Alb 3.0. NPO for now. Eating poorly at SNF (dislikes the food) Resume renal diet, Breeze when able.  Multivitamin. 6. DM - Insulin per primary  7. Hx Atrial flutter - SR/ occ PVCs on tele 8. Hx ORIF of right distal femur fx - Admitted 10/14-10/17. Non wgt bearing. SNF for ~ 5 more weeks per patient  Scot Jun. Thad Ranger [facility] Pager [phone] 10/27/2013,9:22 AM  LOS: 3 days   I have seen and examined patient, discussed with PA and agree with assessment and plan as outlined above with additions as indicated. [name] Moselle MD pager [phone]    cell [phone] 10/27/2013, 2:34 PM     Additional Objective Labs: Basic Metabolic Panel:  Recent Labs Lab 10/24/13 1435 10/25/13 0605  NA 138 136  K 3.4* 3.7  CL 94* 94*  CO2 28 27  GLUCOSE 113* 162*  BUN 37* 47*  CREATININE 4.51* 5.74*  CALCIUM 9.9 9.6   Liver Function Tests:  Recent Labs Lab 10/24/13 1435 10/25/13 0605  AST 24 24  ALT 20 22  ALKPHOS 136* 141*  BILITOT 0.4 0.4  PROT 6.6 6.4  ALBUMIN 3.1* 3.0*   CBC:  Recent Labs Lab 10/24/13 1435 10/24/13 2015 10/25/13 0605 10/26/13 0610 10/27/13 0625  WBC 9.4 9.0 8.2 10.2 11.4*  NEUTROABS 7.0  --   --   --   --   HGB 6.6* 6.9* 7.0* 8.1* 6.7*  HCT 20.5*  21.2* 22.1* 24.6* 20.6*  MCV 97.6 95.1 96.5 94.6 96.7  PLT 280 242 259 250 246   Blood Culture    Component Value Date/Time   SDES STOOL 10/22/2007 0136   SPECREQUEST  IMMUNE:NORM 10/22/2007 0136   CULT KLEBSIELLA OXYTOCA 10/20/2007 1250   REPTSTATUS 10/23/2007 FINAL 10/22/2007 0136    CBG:  Recent Labs Lab 10/26/13 0755 10/26/13 1149 10/26/13 1652 10/26/13 2057 10/27/13 0746  GLUCAP 134* 151* 161* 169* 266*    Studies/Results: No results found. Medications: . sodium chloride 20 mL/hr at 10/24/13 1858   . darbepoetin (ARANESP) injection - DIALYSIS  200 mcg Intravenous Q Fri-HD  . docusate sodium  100 mg Oral BID  . doxercalciferol  6 mcg Intravenous Q M,W,F-HD  . feeding supplement (RESOURCE BREEZE)  1 Container Oral BID BM  . insulin aspart  0-9 Units Subcutaneous TID  WC  . lanthanum  1,000 mg Oral TID WC  . multivitamin  1 tablet Oral QHS  . pantoprazole (PROTONIX) IV  80 mg Intravenous Once  . polyethylene glycol  17 g Oral Daily  . sodium chloride  3 mL Intravenous Q12H  . sodium chloride  3 mL Intravenous Q12H  . sucralfate  1 g Oral TID WC & HS

## 2013-10-27 NOTE — Progress Notes (Signed)
This am, patient with loose stools, gastro consulted, low hemoglobin. 2 units RBC to infuse. GI testing to be done. Will monitor. Sherlyn Lees, RN

## 2013-10-27 NOTE — Progress Notes (Signed)
    Progress Note   Subjective  New bloody BMs overnight, pt states happening about every hour (3 documented by nursing) Painless, though he does have lower abd cramping just before BM (BM's urgent), then cramping resolves entirely No N/V    Objective  Vital signs in last 24 hours: Temp:  [97.9 F (36.6 C)-98.5 F (36.9 C)] 97.9 F (36.6 C) (11/09 0845) Pulse Rate:  [78-84] 82 (11/09 0845) Resp:  [17-24] 24 (11/09 0845) BP: (97-126)/(49-66) 113/50 mmHg (11/09 0845) SpO2:  [93 %-94 %] 94 % (11/09 0845) Weight:  [221 lb 14.4 oz (100.653 kg)] 221 lb 14.4 oz (100.653 kg) (11/08 2101) Last BM Date: 10/22/13 Gen: awake, alert, NAD HEENT: anicteric, op clear CV: RRR, with ectopy Pulm: CTA b/l Abd: soft, NT/ND, +BS throughout Ext: no c/c Neuro: nonfocal   Intake/Output from previous day: 11/08 0701 - 11/09 0700 In: 240 [P.O.:240] Out: -  Intake/Output this shift:    Lab Results:  Recent Labs  10/25/13 0605 10/26/13 0610 10/27/13 0625  WBC 8.2 10.2 11.4*  HGB 7.0* 8.1* 6.7*  HCT 22.1* 24.6* 20.6*  PLT 259 250 246   BMET  Recent Labs  10/24/13 1435 10/25/13 0605  NA 138 136  K 3.4* 3.7  CL 94* 94*  CO2 28 27  GLUCOSE 113* 162*  BUN 37* 47*  CREATININE 4.51* 5.74*  CALCIUM 9.9 9.6   LFT  Recent Labs  10/25/13 0605  PROT 6.4  ALBUMIN 3.0*  AST 24  ALT 22  ALKPHOS 141*  BILITOT 0.4      Assessment & Plan  72 yo with ESRD on HD presenting with heme + stools and anemia found to have duodenal ulcer disease  1. Duodenal ulcers -- s/p endoscopic therapy Friday, rebleeding overnight with maroon-colored stools.  Hgb has decline from 8.1 to 6.7.  Vitals are stable, not tachycardic at present.  Concern is rebleeding of known duodenal ulcers.  Cannot exclude lower source, but unlikely to have 2 separate GI bleeding sources in 2 days. 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

## 2013-10-28 ENCOUNTER — Encounter (HOSPITAL_COMMUNITY): Payer: Self-pay | Admitting: Internal Medicine

## 2013-10-28 LAB — TYPE AND SCREEN
ABO/RH(D): O POS
Antibody Screen: NEGATIVE
Unit division: 0
Unit division: 0
Unit division: 0

## 2013-10-28 LAB — GLUCOSE, CAPILLARY
Glucose-Capillary: 235 mg/dL — ABNORMAL HIGH (ref 70–99)
Glucose-Capillary: 146 mg/dL — ABNORMAL HIGH (ref 70–99)
Glucose-Capillary: 183 mg/dL — ABNORMAL HIGH (ref 70–99)

## 2013-10-28 LAB — CBC
HCT: 27.9 % — ABNORMAL LOW (ref 39.0–52.0)
Hemoglobin: 9.1 g/dL — ABNORMAL LOW (ref 13.0–17.0)
MCH: 30.8 pg (ref 26.0–34.0)
MCHC: 32.6 g/dL (ref 30.0–36.0)
MCV: 94.6 fL (ref 78.0–100.0)
Platelets: 149 10*3/uL — ABNORMAL LOW (ref 150–400)
RDW: 19.7 % — ABNORMAL HIGH (ref 11.5–15.5)
WBC: 12.4 10*3/uL — ABNORMAL HIGH (ref 4.0–10.5)

## 2013-10-28 LAB — RENAL FUNCTION PANEL
Albumin: 2.7 g/dL — ABNORMAL LOW (ref 3.5–5.2)
BUN: 69 mg/dL — ABNORMAL HIGH (ref 6–23)
CO2: 25 mEq/L (ref 19–32)
Calcium: 9.6 mg/dL (ref 8.4–10.5)
Creatinine, Ser: 6.73 mg/dL — ABNORMAL HIGH (ref 0.50–1.35)
Glucose, Bld: 158 mg/dL — ABNORMAL HIGH (ref 70–99)
Phosphorus: 5.6 mg/dL — ABNORMAL HIGH (ref 2.3–4.6)
Potassium: 4.1 mEq/L (ref 3.5–5.1)

## 2013-10-28 LAB — HELICOBACTER PYLORI ABS-IGG+IGA, BLD: H Pylori IgA: 1.5 U/mL (ref <9.0)

## 2013-10-28 MED ORDER — DOXERCALCIFEROL 4 MCG/2ML IV SOLN
INTRAVENOUS | Status: AC
  Filled 2013-10-28: qty 4

## 2013-10-28 NOTE — Progress Notes (Signed)
     [facility] Daily Rounding Note 10/28/2013, 10:07 AM  LOS: 4 days   SUBJECTIVE:       No BMs since before EGD 10/27/13.  Feels well, a lot better than he did at admission.  No nausea.  Remains on full liquids.    OBJECTIVE:         Vital signs in last 24 hours:    Temp:  [97.3 F (36.3 C)-98.6 F (37 C)] 97.7 F (36.5 C) (11/10 0720) Pulse Rate:  [42-98] 80 (11/10 1000) Resp:  [15-25] 17 (11/10 1000) BP: (83-158)/(20-85) 88/50 mmHg (11/10 1000) SpO2:  [92 %-100 %] 97 % (11/10 0720) Weight:  [101 kg (222 lb 10.6 oz)-103 kg (227 lb 1.2 oz)] 101 kg (222 lb 10.6 oz) (11/10 0720) Last BM Date: 10/27/13 General: a bit pale but comfortable and not ill looking   Heart: RRR.  No MRG Chest: clear bil.  No dyspnea Abdomen: soft, NT, ND, active BS  Extremities: trace bil  Pedal edema Neuro/Psych:  Pleasant, relaxed, oriented x 3.  No limb weakness.     Lab Results:  Recent Labs  10/26/13 0610 10/27/13 0625 10/28/13 0540  WBC 10.2 11.4* 12.4*  HGB 8.1* 6.7* 9.1*  HCT 24.6* 20.6* 27.9*  PLT 250 246 149*   BMET  Recent Labs  10/28/13 0540  NA 133*  K 4.1  CL 91*  CO2 25  GLUCOSE 158*  BUN 69*  CREATININE 6.73*  CALCIUM 9.6   LFT  Recent Labs  10/28/13 0540  ALBUMIN 2.7*    ASSESMENT:   *  UGI bleed.   EGD #1 10/25/13 with epi injection of duodenal bulb ulcers: treated with EPI and cautery. 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   EGD #2 10/28/13 for rebleeding: 2 non bleeding gastric ulcers, smaller bulb ulcers, one with VV was RXd with Epi and endoclip. 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   *  ABL anemia, baseline Hgb is ~ 10.  Nadir of 6.6 on 10/24/13. S/p multiple transfusions, I count 9 so far.   *  IDDM  *  Longstanding ESRD  *  Right femur fx, ORIF 10/01/13    PLAN   *  ? Advance diet to diabetic, renal diet? *  Switch to bid IV Protonix tomorrow, continue drip today.    [name]  10/28/2013,  10:07 AM Pager: [phone]   Diet advanced. I agree w/  Management per Ms. Clarita Leber. I have also seen and evaluated the patient today.  Iva Boop, MD, [facility] [phone] (pager) 10/28/2013 5:40 PM

## 2013-10-28 NOTE — Progress Notes (Signed)
[facility] PROGRESS NOTE  JERIME ARIF ZOX:[id number] DOB: [birth year] DOA: 10/24/2013 PCP: Pearson Grippe, MD  Assessment/Plan: 1. Upper GI Bleed. EGD on 10/25/13 showed 4 ulcers found in the duodenal bulb, having epinephrine injection as well as probe cautery. With new bloody bowel movements on the evening of 10/26/13, he underwent repeat endoscopy on 10/27/13 with clip placement of of 1 duodenal bulb ulcer as well as epinephrine injection of angiodysplastic lesion. Protonix drip restarted, on carafate. Will continue Protonix drip, with plans to change over to Protonix IV BID tomorrow.  2. Acute Blood Loss Anemia. Secondary to upper GI bleed, with Hg trending down to 6.7 for which he was transfused 2 units, am labs now showing Hg of 9.1. No further episodes of GI bleed.  3. ESRD. Nephrology consult, undergoing HD during this hospitalization. 4. Diabetes Mellitus. Continue accuchecks q ac q hs with SSI.   Code Status: full code Disposition Plan: Start patient on bowel regimen, has not had further episodes of GI bleed. Anticipate discharge in the next 24 hours.   Consultants:  Nephrology  GI  Procedures:  EGD on 10/25/13 and 10/27/13    HPI/Subjective: Patient reporting no further episodes of bloody stools. States doing well overall, though feeling a little tired after HD. Stable Hg.   Objective: Filed Vitals:   10/28/13 1130  BP: 111/51  Pulse: 80  Temp: 97.9 F (36.6 C)  Resp: 24    Intake/Output Summary (Last 24 hours) at 10/28/13 1534 Last data filed at 10/28/13 1130  Gross per 24 hour  Intake    120 ml  Output   2066 ml  Net  -1946 ml   Filed Weights   10/27/13 2202 10/28/13 0720 10/28/13 1130  Weight: 103 kg (227 lb 1.2 oz) 101 kg (222 lb 10.6 oz) 98.2 kg (216 lb 7.9 oz)    Exam:  General: No distress,  Eyes: PERRL, EOMI, no scleral icterus  Cardiovascular: regular rate without MRG; 2+ peripheral pulses  Respiratory: CTA biL, good air movement without  wheezing, rhonchi or crackles  Abdomen: soft, non tender to palpation, positive bowel sounds, no guarding, no rebound  Musculoskeletal: no peripheral edema; surgical site on the right thigh well-healed, no bleeding, no discharges.  Neurologic: Grossly nonfocal   Data Reviewed: Basic Metabolic Panel:  Recent Labs Lab 10/24/13 1435 10/25/13 0605 10/28/13 0540  NA 138 136 133*  K 3.4* 3.7 4.1  CL 94* 94* 91*  CO2 28 27 25   GLUCOSE 113* 162* 158*  BUN 37* 47* 69*  CREATININE 4.51* 5.74* 6.73*  CALCIUM 9.9 9.6 9.6  PHOS  --   --  5.6*   Liver Function Tests:  Recent Labs Lab 10/24/13 1435 10/25/13 0605 10/28/13 0540  AST 24 24  --   ALT 20 22  --   ALKPHOS 136* 141*  --   BILITOT 0.4 0.4  --   PROT 6.6 6.4  --   ALBUMIN 3.1* 3.0* 2.7*   No results found for this basename: LIPASE, AMYLASE,  in the last 168 hours No results found for this basename: AMMONIA,  in the last 168 hours CBC:  Recent Labs Lab 10/24/13 1435 10/24/13 2015 10/25/13 0605 10/26/13 0610 10/27/13 0625 10/28/13 0540  WBC 9.4 9.0 8.2 10.2 11.4* 12.4*  NEUTROABS 7.0  --   --   --   --   --   HGB 6.6* 6.9* 7.0* 8.1* 6.7* 9.1*  HCT 20.5* 21.2* 22.1* 24.6* 20.6* 27.9*  MCV 97.6 95.1  96.5 94.6 96.7 94.6  PLT 280 242 259 250 246 149*   Cardiac Enzymes: No results found for this basename: CKTOTAL, CKMB, CKMBINDEX, TROPONINI,  in the last 168 hours BNP (last 3 results) No results found for this basename: PROBNP,  in the last 8760 hours CBG:  Recent Labs Lab 10/27/13 0746 10/27/13 1137 10/27/13 1600 10/27/13 2220 10/28/13 1214  GLUCAP 266* 150* 153* 235* 106*    Recent Results (from the past 240 hour(s))  MRSA PCR SCREENING     Status: None   Collection Time    10/24/13  6:02 PM      Result Value Range Status   MRSA by PCR NEGATIVE  NEGATIVE Final   Comment:            The GeneXpert MRSA Assay (FDA     approved for NASAL specimens     only), is one component of a     comprehensive  MRSA colonization     surveillance program. It is not     intended to diagnose MRSA     infection nor to guide or     monitor treatment for     MRSA infections.     Studies: No results found.  Scheduled Meds: . darbepoetin (ARANESP) injection - DIALYSIS  200 mcg Intravenous Q Fri-HD  . docusate sodium  100 mg Oral BID  . doxercalciferol  6 mcg Intravenous Q M,W,F-HD  . feeding supplement (RESOURCE BREEZE)  1 Container Oral BID BM  . insulin aspart  0-9 Units Subcutaneous TID WC  . lanthanum  1,000 mg Oral TID WC  . multivitamin  1 tablet Oral QHS  . pantoprazole (PROTONIX) IV  80 mg Intravenous Once  . [START ON 10/31/2013] pantoprazole (PROTONIX) IV  40 mg Intravenous Q12H  . polyethylene glycol  17 g Oral Daily  . sodium chloride  3 mL Intravenous Q12H  . sodium chloride  3 mL Intravenous Q12H  . sucralfate  1 g Oral TID WC & HS   Continuous Infusions: . sodium chloride 20 mL/hr at 10/24/13 1858  . sodium chloride 20 mL/hr at 10/27/13 1750  . 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 If 7PM-7AM, please contact night-coverage at [email or web address], password [facility] 10/28/2013, 3:34 PM  LOS: 4 days

## 2013-10-28 NOTE — Progress Notes (Signed)
Subjective:  On hd , no cos Objective Vital signs in last 24 hours: Filed Vitals:   10/27/13 2202 10/28/13 0500 10/28/13 0720 10/28/13 0729  BP: 148/65 116/58 117/59 120/55  Pulse: 83 78 77 78  Temp: 97.9 F (36.6 C) 97.6 F (36.4 C) 97.7 F (36.5 C)   TempSrc: Oral Oral Oral   Resp: 20 20 20 19   Height:      Weight: 103 kg (227 lb 1.2 oz)  101 kg (222 lb 10.6 oz)   SpO2: 96% 95% 97%    Weight change: 2.347 kg (5 lb 2.8 oz)  Intake/Output Summary (Last 24 hours) at 10/28/13 0816 Last data filed at 10/27/13 1804  Gross per 24 hour  Intake    645 ml  Output      0 ml  Net    645 ml   Labs: Basic Metabolic Panel:  Recent Labs Lab 10/24/13 1435 10/25/13 0605 10/28/13 0540  NA 138 136 133*  K 3.4* 3.7 4.1  CL 94* 94* 91*  CO2 28 27 25   GLUCOSE 113* 162* 158*  BUN 37* 47* 69*  CREATININE 4.51* 5.74* 6.73*  CALCIUM 9.9 9.6 9.6  PHOS  --   --  5.6*   Liver Function Tests:  Recent Labs Lab 10/24/13 1435 10/25/13 0605 10/28/13 0540  AST 24 24  --   ALT 20 22  --   ALKPHOS 136* 141*  --   BILITOT 0.4 0.4  --   PROT 6.6 6.4  --   ALBUMIN 3.1* 3.0* 2.7*   No results found for this basename: LIPASE, AMYLASE,  in the last 168 hours No results found for this basename: AMMONIA,  in the last 168 hours CBC:  Recent Labs Lab 10/24/13 1435 10/24/13 2015 10/25/13 0605 10/26/13 0610 10/27/13 0625 10/28/13 0540  WBC 9.4 9.0 8.2 10.2 11.4* 12.4*  NEUTROABS 7.0  --   --   --   --   --   HGB 6.6* 6.9* 7.0* 8.1* 6.7* 9.1*  HCT 20.5* 21.2* 22.1* 24.6* 20.6* 27.9*  MCV 97.6 95.1 96.5 94.6 96.7 94.6  PLT 280 242 259 250 246 149*   Cardiac Enzymes: No results found for this basename: CKTOTAL, CKMB, CKMBINDEX, TROPONINI,  in the last 168 hours CBG:  Recent Labs Lab 10/26/13 2057 10/27/13 0746 10/27/13 1137 10/27/13 1600 10/27/13 2220  GLUCAP 169* 266* 150* 153* 235*    Iron Studies: No results found for this basename: IRON, TIBC, TRANSFERRIN, FERRITIN,  in  the last 72 hours Studies/Results: No results found. Medications: . sodium chloride 20 mL/hr at 10/24/13 1858  . sodium chloride 20 mL/hr at 10/27/13 1750  . pantoprozole (PROTONIX) infusion 8 mg/hr (10/27/13 1749)   . darbepoetin (ARANESP) injection - DIALYSIS  200 mcg Intravenous Q Fri-HD  . docusate sodium  100 mg Oral BID  . doxercalciferol  6 mcg Intravenous Q M,W,F-HD  . feeding supplement (RESOURCE BREEZE)  1 Container Oral BID BM  . insulin aspart  0-9 Units Subcutaneous TID WC  . lanthanum  1,000 mg Oral TID WC  . multivitamin  1 tablet Oral QHS  . pantoprazole (PROTONIX) IV  80 mg Intravenous Once  . [START ON 10/31/2013] pantoprazole (PROTONIX) IV  40 mg Intravenous Q12H  . polyethylene glycol  17 g Oral Daily  . sodium chloride  3 mL Intravenous Q12H  . sodium chloride  3 mL Intravenous Q12H  . sucralfate  1 g Oral TID WC & HS   I  have reviewed scheduled and prn medications.  Physical Exam: General: Alert obese elderly  WM, normal MS for him Heart: RRR, no murmur or rub Lungs: CTA bilat.  Abdomen: Obese, bs pos., nontender Extremities: Dialysis Access: Trace bipedal edema/ LU AVG patent on hd   Dialysis Orders: Center: [place] on MWF.  EDW 106 kg HD Bath 2K/2Ca Time 4:00 Heparin 9000 u. Access L AVG BFR 450 DFR A1.5  Hectorol 6 mcg IV/HD Epogen 10,000 Units IV/HD Venofer 0  Recent labs: Hgb 6.5 < 8.8 < 9.8, P 6.8, PTH 558     Assessment/Plan:  1  .ABLA secondary to Upper GIB( 5 Duodenal Bulb ulces,/suspected Barrett's esophagus, Angiodysplatic Lession gastric antrum  On egd yesterday) - Hgb 9.1>6.7yesterday > 8.1 and 6.2 on admit s/p 5 units PRBCs. 2 given yesterday/ On  Aranesp 200 q Friday. Also on Carafate - NEEDED SHORT TERM 2.ESRD - MWF  ([place]) Last K + 4.1. Using, No heparin 3Hypertension/volume -  Wt = 100 kg/Under EDW with BP  Stable on hd so far/ Lower edw at d/c       4.Metabolic bone disease - Ca 9.6 (10.7corrected.) Phos 5.4 in hosp phos was poorly  controlled op on Tums          ultra 2 ac. Last PTH 558. On 2.0 Ca bath and 6 mcg vit d. Have Tums d/c'd. Changed to  Fosrenol 1 g TI       5.Nutrition - Alb 2.7  Liquids restarted/ using Breeze supplement/renal vi       6DM -  management per primary        7 Hx Atrial flutter - currently SR on Exam       8.Hx ORIF of right distal femur fx - Admitted 10/14-10/17. Non wgt bearing. SNF( [facility] )   Lenny Pastel, PA-C [facility] 1. Beeper [phone] 10/28/2013,8:16 AM  LOS: 4 days

## 2013-10-28 NOTE — Progress Notes (Signed)
Pt old PIV at right anterior arm infiltrated;pt is complaining pain,site is red,bruish.RN aware.NEw  PIV inserted at right upper arm with good blood return and pt tolerated well.

## 2013-10-28 NOTE — Progress Notes (Addendum)
Pt seen and agree with above note.  No further BMs since EGD yesterday.  On HD w/o difficulties.

## 2013-10-28 NOTE — Procedures (Signed)
I was present at this dialysis session. I have reviewed the session itself and made appropriate changes.  Feels well.  No BMs overnight.     Sabra Heck  MD 10/28/2013, 9:24 AM

## 2013-10-29 LAB — GLUCOSE, CAPILLARY
Glucose-Capillary: 185 mg/dL — ABNORMAL HIGH (ref 70–99)
Glucose-Capillary: 148 mg/dL — ABNORMAL HIGH (ref 70–99)
Glucose-Capillary: 151 mg/dL — ABNORMAL HIGH (ref 70–99)
Glucose-Capillary: 126 mg/dL — ABNORMAL HIGH (ref 70–99)

## 2013-10-29 LAB — CBC
HCT: 26.8 % — ABNORMAL LOW (ref 39.0–52.0)
Hemoglobin: 8.6 g/dL — ABNORMAL LOW (ref 13.0–17.0)
MCH: 31.2 pg (ref 26.0–34.0)
MCV: 97.1 fL (ref 78.0–100.0)
Platelets: 248 10*3/uL (ref 150–400)
RBC: 2.76 MIL/uL — ABNORMAL LOW (ref 4.22–5.81)

## 2013-10-29 MED ORDER — LACTULOSE 10 GM/15ML PO SOLN
20.0000 g | Freq: Two times a day (BID) | ORAL | Status: DC | PRN
  Filled 2013-10-29: qty 30

## 2013-10-29 MED ORDER — PANTOPRAZOLE SODIUM 40 MG PO TBEC
40.0000 mg | DELAYED_RELEASE_TABLET | Freq: Two times a day (BID) | ORAL | Status: DC
  Administered 2013-10-29 – 2013-10-30 (×3): 40 mg via ORAL
  Filled 2013-10-29 (×3): qty 1

## 2013-10-29 NOTE — Discharge Summary (Addendum)
Physician Discharge Summary  [name]:[id number] DOB: Jan 31, 1941 DOA: 10/24/2013  PCP: Pearson Grippe, MD  Admit date: 10/24/2013 Discharge date: 10/29/2013  Time spent: 35 minutes  Recommendations for Outpatient Follow-up:  1. Please followup on repeat CBC and 4 days. Patient admitted for GI bleed and acute blood loss anemia. 2.  Follow-up Information   Follow up with Beverley Fiedler, MD On 11/28/2013. (9:30 AM with stomach MD)    Specialty:  Gastroenterology   Contact information:   [address] 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 male has a past medical history significant for end-stage renal disease on hemodialysis Monday Wednesday and Fridays, insulin-dependent diabetes mellitus, hypertension, remote history of atrial flutter, gout, was recently hospitalized about 3 weeks ago for right distal femoral fracture status post ORIF currently nonweightbearing, discharged to skilled nursing facility. On Tuesday, the nursing staff observed some black stools, patient underwent a CBC which showed a hemoglobin of 6.2. He was hence directed to the ED for potential GI bleed. Patient denies any symptoms currently, he denies any chest pain, shortness of  breath, denies any abdominal pain nausea or vomiting. He endorses few episodes of diarrhea. He denies any palpitations, denies any lightheadedness or dizziness. He feels at baseline. Following discharge, he was on aspirin 325 mg twice a day for DVT prophylaxis. He reports compliance with his medication and compliance with his dialysis regimen. In the emergency room, labs are pertinent for hemoglobin of 6.6, BUN of 37 and creatinine 4.5. FOBT is positive.   Hospital Course:  Patient is a pleasant 72 year old gentleman with a past medical history of end-stage renal disease, presented to the emergency room on 10/24/2013 with complaints of dark stools. This was observed by nursing staff at his skilled nursing facility. A CBC was checked which showed a hemoglobin of 6.2 thereby transferred to the emergency department at [facility]. Initial lab work at this facility showed a hemoglobin of 6.6 with hematocrit of 20.5. He was typed and crossed and transfused with packed red blood cells. Patient was also started on a Protonix drip. On the following day repeat lab work showed a hemoglobin of 7.0 after receiving 2 units for which he was transfused an additional unit. Gastroenterology was consulted. Nephrology was also involved in his case as he has a history of end-stage renal disease and underwent hemodialysis during his hospitalization. On 10/25/2013 he underwent EGD which showed 4 ulcers found in the duodenal bulb, having epinephrine injection as well as probe cautery. Tolerated procedure well with no immediate complications. Patient seemed improved, was tolerating by mouth intake. I had planned on discharging him on the following morning however on the evening of 10/26/2013 patient had 3 bloody bowel movements  with a.m. labs show an atrophic in hemoglobin to 6.7. He was transfused with 2 units of packed red blood cells and taken back for repeat EGD. He had clip placement of duodenal bulb ulcer as well as  epinephrine injection of a angiodysplastic lesion. Patient was restarted on Protonix drip. On the following day he was changed to IV Protonix twice a day. Hemoglobin since then has remained stable as he has not had further episodes of bloody stools. Plan to repeat a.m. CBC, if remains stable, and does not have further episodes of bleed we'll transfer him back to a skilled nursing facility.  Procedures:  EGD performed on 10/25/2013, which showed 4 ulcers in the duodenal bulb, status post injection with epinephrine and cautery.  EGD performed on 10/27/2013, patient undergoing clip placement of duodenal bulb ulcer as well as epinephrine injection of angioplasty lesion.  Consultations:  Nephrology  Gastroenterology  Discharge Exam: Filed Vitals:   10/29/13 1313  BP: 125/61  Pulse: 81  Temp: 98.5 F (36.9 C)  Resp: 22    Discharge Instructions   Future Appointments Provider Department Dept Phone   11/28/2013 9:30 AM Beverley Fiedler, MD [facility] [phone]       Medication List         allopurinol 300 MG tablet  Commonly known as:  ZYLOPRIM  Take 150 mg by mouth daily.     aspirin EC 81 MG tablet  Take 1 tablet (81 mg total) by mouth daily.     feeding supplement (NEPRO CARB STEADY) Liqd  Take 237 mLs by mouth 3 (three) times a week. On Monday, Wednesday, and Friday     fish oil-omega-3 fatty acids 1000 MG capsule  Take 1 g by mouth daily.     HYDROcodone-acetaminophen 5-325 MG per tablet  Commonly known as:  NORCO/VICODIN  Take 1 tablet by mouth every 8 (eight) hours as needed for moderate pain.     insulin aspart 100 UNIT/ML injection  Commonly known as:  novoLOG  Inject 0-5 Units into the skin 4 (four) times daily -  before meals and at bedtime. 0 -150 = 0 units,   151+= 5 units     lactulose 10 GM/15ML solution  Commonly known as:  CHRONULAC  Take 30 mLs (20 g total) by mouth 2 (two) times daily as needed for moderate constipation.      lanthanum 1000 MG chewable tablet  Commonly known as:  FOSRENOL  Chew 1 tablet (1,000 mg total) by mouth 3 (three) times daily with meals.     multivitamin Tabs tablet  Take 1 tablet by mouth daily.     pantoprazole 40 MG tablet  Commonly known as:  PROTONIX  Take 1 tablet (40 mg total) by mouth 2 (two) times daily.     sucralfate 1 G tablet  Commonly known as:  CARAFATE  Take 1 tablet (1 g total) by mouth 4 (four) times daily -  with meals and at bedtime.     vitamin C 500 MG tablet  Commonly known as:  ASCORBIC ACID  Take 500 mg by mouth daily.        Allergies  Allergen Reactions  . Penicillins Rash  . Sulfa Antibiotics Rash  . Glucophage [Metformin Hydrochloride] Rash       Follow-up Information   Follow up with Beverley Fiedler, MD On 11/28/2013. (9:30 AM with stomach MD)    Specialty:  Gastroenterology   Contact information:   [address] [facility]  [place] [address] [phone]        The results of significant diagnostics from this hospitalization (including imaging, microbiology, ancillary and laboratory) are listed below for reference.    Significant Diagnostic Studies: Dg Chest 1 View  10/01/2013   CLINICAL DATA:  Fall with no chest complaints.  EXAM: CHEST - 1 VIEW  COMPARISON:  07/02/2013.  FINDINGS: Cardiomegaly. Calcified tortuous aorta. Mild vascular congestion. Mild basilar atelectasis. Osseous demineralization. Dialysis catheter has been removed since the previous radiograph.  IMPRESSION: Cardiomegaly without active infiltrates or failure. Improved aeration from priors.   Electronically Signed   By: Davonna Belling M.D.   On: 10/01/2013 01:57   Dg Chest 2 View  10/03/2013   CLINICAL DATA:  Hypoxia, short of breath  EXAM: CHEST  2 VIEW  COMPARISON:  Most recent prior chest x-ray 10/01/2013  FINDINGS: Stable mild cardiomegaly. Mediastinal contours are unchanged. Atherosclerotic calcification noted in the transverse aorta. Inspiratory volumes are slightly  lower. There is persistent of horizontal atelectasis versus scarring in the bilateral bases. Similar degree of pulmonary vascular congestion without overt edema. No pleural effusion or pneumothorax. Metallic stents project over the heart. No acute osseous abnormality.  IMPRESSION: Slightly lower inspiratory volumes. Otherwise, no acute cardiopulmonary process.  Stable vascular congestion without overt edema and bibasilar atelectasis versus scarring.   Electronically Signed   By: Malachy Moan M.D.   On: 10/03/2013 13:35   Dg Hip Complete Right  10/01/2013   *RADIOLOGY REPORT*  Clinical Data: Right knee pain, fall.  History of right hip surgery.  RIGHT HIP - COMPLETE 2+ VIEW  Comparison: None available at time of study interpretation.  Findings: Five images of the pelvis and right hip.  Status post right hip total arthroplasty, hardware appears intact well seated without periprosthetic lucency.  No fracture deformity or dislocation.  Severe left hip joint space narrowing with mild marginal spurring.  Sacroiliac joints are symmetric.  Bone mineral density is decreased without destructive bony lesions.  Moderate vascular calcification.  Periarticular soft tissue planes are not suspicious.  IMPRESSION: No acute fracture deformity or dislocation.  Status post right hip total arthroplasty without radiographic findings of hardware failure.  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   Original Report Authenticated By: Awilda Metro   Dg Femur Right  10/01/2013   CLINICAL DATA:  Femur fracture.  EXAM: RIGHT FEMUR - 2 VIEW; DG C-ARM 1-60 MIN  COMPARISON:  10/01/2013  FINDINGS: Intraoperative images of the right femur were obtained. A lateral femoral plate has been placed. There are proximal and distal screws. Again noted is a displaced fracture of the distal femur.  IMPRESSION: Internal fixation of the displaced distal femur fracture.   Electronically Signed   By: Richarda Overlie M.D.   On: 10/01/2013 19:16   Dg  Femur Right Port  10/01/2013   CLINICAL DATA:  Status post fracture fixation.  EXAM: PORTABLE RIGHT FEMUR - 2 VIEW  COMPARISON:  Plain films earlier this same day.  FINDINGS: Plate and screws are in place for fixation of a distal femur fracture. Hardware is intact. Position and alignment are anatomic. No new abnormality is identified. Surgical staples are noted.  IMPRESSION: ORIF distal right femur fracture without acute abnormality.   Electronically Signed   By: Drusilla Kanner M.D.   On: 10/01/2013 20:45   Dg Knee Complete 4 Views Right  10/01/2013   *RADIOLOGY REPORT*  Clinical Data: Fall, knee pain.  RIGHT KNEE - COMPLETE 4+ VIEW  Comparison: None available  at time of study interpretation.  Findings: Comminuted distal femur metadiaphyseal impacted fracture with slight medial deviation of the distal bony fragments.  No definite interarticular extension.  No destructive bony lesions. Bone mineral density is decreased.  Moderate suprapatellar joint effusion.  Mild vascular calcifications.  IMPRESSION: Minimally displaced distal femur impacted metadiaphyseal fracture without dislocation.  Osteopenia.   Original Report Authenticated By: Awilda Metro   Dg C-arm 1-60 Min  10/01/2013   CLINICAL DATA:  Femur fracture.  EXAM: RIGHT FEMUR - 2 VIEW; DG C-ARM 1-60 MIN  COMPARISON:  10/01/2013  FINDINGS: Intraoperative images of the right femur were obtained. A lateral femoral plate has been placed. There are proximal and distal screws. Again noted is a displaced fracture of the distal femur.  IMPRESSION: Internal fixation of the displaced distal femur fracture.   Electronically Signed   By: Richarda Overlie M.D.   On: 10/01/2013 19:16    Microbiology: Recent Results (from the past 240 hour(s))  MRSA PCR SCREENING     Status: None   Collection Time    10/24/13  6:02 PM      Result Value Range Status   MRSA by PCR NEGATIVE  NEGATIVE Final   Comment:            The GeneXpert MRSA Assay (FDA     approved for  NASAL specimens     only), is one component of a     comprehensive MRSA colonization     surveillance program. It is not     intended to diagnose MRSA     infection nor to guide or     monitor treatment for     MRSA infections.     Labs: Basic Metabolic Panel:  Recent Labs Lab 10/24/13 1435 10/25/13 0605 10/28/13 0540  NA 138 136 133*  K 3.4* 3.7 4.1  CL 94* 94* 91*  CO2 28 27 25   GLUCOSE 113* 162* 158*  BUN 37* 47* 69*  CREATININE 4.51* 5.74* 6.73*  CALCIUM 9.9 9.6 9.6  PHOS  --   --  5.6*   Liver Function Tests:  Recent Labs Lab 10/24/13 1435 10/25/13 0605 10/28/13 0540  AST 24 24  --   ALT 20 22  --   ALKPHOS 136* 141*  --   BILITOT 0.4 0.4  --   PROT 6.6 6.4  --   ALBUMIN 3.1* 3.0* 2.7*   No results found for this basename: LIPASE, AMYLASE,  in the last 168 hours No results found for this basename: AMMONIA,  in the last 168 hours CBC:  Recent Labs Lab 10/24/13 1435  10/25/13 0605 10/26/13 0610 10/27/13 0625 10/28/13 0540 10/29/13 0603  WBC 9.4  < > 8.2 10.2 11.4* 12.4* 10.6*  NEUTROABS 7.0  --   --   --   --   --   --   HGB 6.6*  < > 7.0* 8.1* 6.7* 9.1* 8.6*  HCT 20.5*  < > 22.1* 24.6* 20.6* 27.9* 26.8*  MCV 97.6  < > 96.5 94.6 96.7 94.6 97.1  PLT 280  < > 259 250 246 149* 248  < > = values in this interval not displayed. Cardiac Enzymes: No results found for this basename: CKTOTAL, CKMB, CKMBINDEX, TROPONINI,  in the last 168 hours BNP: BNP (last 3 results) No results found for this basename: PROBNP,  in the last 8760 hours CBG:  Recent Labs Lab 10/28/13 1659 10/28/13 2126 10/29/13 0731 10/29/13 1132 10/29/13 1626  GLUCAP 146* 183*  185* 148* 151*       Signed:  ZAMORA, EZEQUIEL  [facility] 10/29/2013, 5:49 PM

## 2013-10-29 NOTE — Progress Notes (Signed)
H pylori Ab neg I see that Jennye Moccasin, PA-C is arranging outpt followup with me, which is appropriate. Continue BID PPI until office visit

## 2013-10-29 NOTE — Progress Notes (Signed)
[facility] Daily Rounding Note 10/29/2013, 8:43 AM  LOS: 5 days   SUBJECTIVE:       3 or 4 brown stools in last 18 hours.  These are brown.  Appetite still diminished, no nausea  OBJECTIVE:         Vital signs in last 24 hours:    Temp:  [97.9 F (36.6 C)-98.3 F (36.8 C)] 98.3 F (36.8 C) (11/11 0500) Pulse Rate:  [68-88] 86 (11/11 0500) Resp:  [17-25] 21 (11/11 0500) BP: (88-119)/(44-70) 115/70 mmHg (11/11 0500) SpO2:  [90 %-97 %] 90 % (11/11 0500) Weight:  [98.2 kg (216 lb 7.9 oz)-100.699 kg (222 lb)] 100.699 kg (222 lb) (11/11 0609) Last BM Date: 10/27/13 General: looks well, slightly pale.  comfortable   Heart: RRR Chest: clear, no labored breathing Abdomen: soft, obese, NT, ND  Extremities: no CCE Neuro/Psych:  Pleasant, full oriented, appropriate, no gross deficits.   Intake/Output from previous day: 11/10 0701 - 11/11 0700 In: -  Out: 2066   Intake/Output this shift:    Lab Results:  Recent Labs  10/27/13 0625 10/28/13 0540 10/29/13 0603  WBC 11.4* 12.4* 10.6*  HGB 6.7* 9.1* 8.6*  HCT 20.6* 27.9* 26.8*  PLT 246 149* 248   BMET  Recent Labs  10/28/13 0540  NA 133*  K 4.1  CL 91*  CO2 25  GLUCOSE 158*  BUN 69*  CREATININE 6.73*  CALCIUM 9.6   LFT  Recent Labs  10/28/13 0540  ALBUMIN 2.7*   Helicobacter serum AB is 1.5, less than 9.0 is considered negative.  PT/INR No results found for this basename: LABPROT, INR,  in the last 72 hours Hepatitis Panel No results found for this basename: HEPBSAG, HCVAB, HEPAIGM, HEPBIGM,  in the last 72 hours  Studies/Results: No results found.  ASSESMENT:   * UGI bleed.  EGD #1 10/25/13 with epi injection of duodenal bulb ulcers: treated with EPI and cautery. 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  EGD #2 10/28/13 for rebleeding: 2 non bleeding gastric ulcers, smaller bulb ulcers, one with VV was RXd with Epi and endoclip. Gastric AVM was RXd with epi and cauterization  Serum H pylori from  10/25/13 is negative. Taking 325 ASA PTA.  On Carafate, IV Protonix gtt.switches to BID IV today.  * ABL anemia, baseline Hgb is ~ 10. Nadir of 6.6 on 10/24/13. S/p multiple transfusions, I count 9 so far. Hgb low but relatively stable.  * IDDM  * Longstanding ESRD  * Right femur fx, ORIF 10/01/13    PLAN   *  Advance diet to solids.  Stop IVF.  Stop PPI drip, begin oral Protonix, continue Carafate.  *  ? Will he need Carafate, and for how long, after discharge. *  Stopped Lactulose and Miralax.  Will add Lactulose prn.  *  ? When will be ok to resme ASA.  No hx CAD, PVD, CVA etc, so wonder if dose should be lowered to 81 mg when resumed.  *  CBC in AM *  Will arrange ROV for pt with Dr Rhea Belton in 2 to 4 weeks.  Anemia can be monitored at HD.     Jennye Moccasin  10/29/2013, 8:43 AM Pager: [phone]  [facility] Attending  I have also seen and assessed the patient and agree with the above note. Hold ASA x 2 weeks - I think he needs 81 mg daily as a preventive (he says was on 81 mg PTA) BID PPi at  dc and then qd forever Carafate x 1 month Call us back if ?  Iva Boop, MD, [facility] [phone] (pager) 10/29/2013 6:28 PM

## 2013-10-29 NOTE — Plan of Care (Signed)
Problem: Phase III Progression Outcomes Goal: Discharge plan remains appropriate-arrangements made Outcome: Progressing Pt to return to [facility]

## 2013-10-29 NOTE — Progress Notes (Signed)
Admit: 10/24/2013 LOS: 5  2M with UGIB s/p EGD and Tx x2, ESRD MWF at [facility]  Subjective:  Pt w/o complaints 2 brown BMs in past 24h Feels well   11/10 0701 - 11/11 0700 In: -  Out: 2066   Filed Weights   10/28/13 0720 10/28/13 1130 10/29/13 0609  Weight: 101 kg (222 lb 10.6 oz) 98.2 kg (216 lb 7.9 oz) 100.699 kg (222 lb)    Current meds: reviewed Hectorol 6qtx,aranesp 200 qFri, fosrenol qAC, BID PPI Current Labs: reviewed   Outpt HD Orders Unit: [place] MWF Time: 4h Dialyzer:  EDW: 106kg K/Ca: 2K / 2Ca Access: AVG  BFR: 450 UF Proflie:  VDRA: Hectorol 6 qTx EPO: 10k qTx IV Fe: none   Physical Exam:  Blood pressure 125/61, pulse 81, temperature 98.5 F (36.9 C), temperature source Oral, resp. rate 22, height 6\' 1"  (1.854 m), weight 100.699 kg (222 lb), SpO2 95.00%. NAD RRR CTAB abd s/nt/nd, nabs Nonfocal, aao x3  Assessment/Plan 1. ESRD MWF: plan for HD tomorrow; No heparin with Tx 2. HTN/Vol: Pt under EDW.  Will aim for EDW 101kg tomorrow.   3. Anemia 2/2 CKD and ABLA: full dose aranesp.  Stable 4. 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  Fosrenol.  VDRA with Tx 6. UGIB: per TRH and GI  Sabra Heck MD 10/29/2013, 3:49 PM   Recent Labs Lab 10/24/13 1435 10/25/13 0605 10/28/13 0540  NA 138 136 133*  K 3.4* 3.7 4.1  CL 94* 94* 91*  CO2 28 27 25   GLUCOSE 113* 162* 158*  BUN 37* 47* 69*  CREATININE 4.51* 5.74* 6.73*  CALCIUM 9.9 9.6 9.6  PHOS  --   --  5.6*    Recent Labs Lab 10/24/13 1435  10/27/13 0625 10/28/13 0540 10/29/13 0603  WBC 9.4  < > 11.4* 12.4* 10.6*  NEUTROABS 7.0  --   --   --   --   HGB 6.6*  < > 6.7* 9.1* 8.6*  HCT 20.5*  < > 20.6* 27.9* 26.8*  MCV 97.6  < > 96.7 94.6 97.1  PLT 280  < > 246 149* 248  < > = values in this interval not displayed.  Current Facility-Administered Medications  Medication Dose Route Frequency Provider Last Rate Last Dose  . darbepoetin (ARANESP) injection 200 mcg  200 mcg Intravenous Q Fri-HD Kerin Salen,  PA-C   200 mcg at 10/25/13 1527  . docusate sodium (COLACE) capsule 100 mg  100 mg Oral BID Jeralyn Bennett, MD   100 mg at 10/29/13 1027  . doxercalciferol (HECTOROL) injection 6 mcg  6 mcg Intravenous Q M,W,F-HD Kerin Salen, PA-C   6 mcg at 10/28/13 1020  . feeding supplement (RESOURCE BREEZE) (RESOURCE BREEZE) liquid 1 Container  1 Container Oral BID BM Kerin Salen, PA-C   1 Container at 10/28/13 1722  . HYDROcodone-acetaminophen (NORCO/VICODIN) 5-325 MG per tablet 1 tablet  1 tablet Oral Q8H PRN Leatha Gilding, MD   1 tablet at 10/27/13 2217  . insulin aspart (novoLOG) injection 0-9 Units  0-9 Units Subcutaneous TID WC Leatha Gilding, MD   1 Units at 10/29/13 1209  . lactulose (CHRONULAC) 10 GM/15ML solution 20 g  20 g Oral BID PRN Dianah Field, PA-C      . lanthanum [facility]) chewable tablet 1,000 mg  1,000 mg Oral TID WC Kerin Salen, PA-C   1,000 mg at 10/29/13 1209  . multivitamin (RENA-VIT) tablet 1 tablet  1  tablet Oral QHS Kerin Salen, PA-C   1 tablet at 10/28/13 2051  . ondansetron (ZOFRAN) tablet 4 mg  4 mg Oral Q6H PRN Costin Otelia Sergeant, MD       Or  . ondansetron (ZOFRAN) injection 4 mg  4 mg Intravenous Q6H PRN Costin Otelia Sergeant, MD      . pantoprazole (PROTONIX) EC tablet 40 mg  40 mg Oral BID Dianah Field, PA-C   40 mg at 10/29/13 1209  . sucralfate (CARAFATE) tablet 1 g  1 g Oral TID WC & HS Jeralyn Bennett, MD   1 g at 10/29/13 1209

## 2013-10-29 NOTE — Care Management Note (Signed)
   CARE MANAGEMENT NOTE 10/29/2013  Patient:  [name], [name]   Account Number:  [id number]  Date Initiated:  10/25/2013  Documentation initiated by:  Darlyne Russian  Subjective/Objective Assessment:   Patient admitted from [facility] with black tarry stools.  INPT: 10/01/2013 - 10/04/2013 fx femur with ORIF.  Medical History: ESRD/HD, DM     Action/Plan:   Progression of care and discharge planning  10/29/13 Adv diet pt will return to SNF   Anticipated DC Date:  10/31/2013   Anticipated DC Plan:  SKILLED NURSING FACILITY  In-house referral  Clinical Social Worker         Choice offered to / List presented to:             Status of service:  Completed, signed off Medicare Important Message given?   (If response is "NO", the following Medicare IM given date fields will be blank) Date Medicare IM given:   Date Additional Medicare IM given:    Discharge Disposition:  SKILLED NURSING FACILITY  Per UR Regulation:  Reviewed for med. necessity/level of care/duration of stay  If discussed at Long Length of Stay Meetings, dates discussed:    Comments:

## 2013-10-29 NOTE — Clinical Social Work Note (Signed)
CSW continuing to monitor patient's progress and will assist with patient's discharge back to [facility].  Genelle Bal, MSW, LCSW [phone]

## 2013-10-29 NOTE — Progress Notes (Signed)
[facility] PROGRESS NOTE  [name]:[id number] DOB: 07/18/1941 DOA: 10/24/2013 PCP: Pearson Grippe, MD  Assessment/Plan: 1. Upper GI Bleed. EGD on 10/25/13 showed 4 ulcers found in the duodenal bulb, having epinephrine injection as well as probe cautery. With new bloody bowel movements on the evening of 10/26/13, he underwent repeat endoscopy on 10/27/13 with clip placement of of 1 duodenal bulb ulcer as well as epinephrine injection of angiodysplastic lesion. On protonix 40 mg PO BID now.  2. Acute Blood Loss Anemia. Secondary to upper GI bleed, with Hg trending down to 6.7 for which he was transfused 2 units, am labs now showing Hg of 8.6. No further episodes of GI bleed.  3. ESRD. Nephrology consult, undergoing HD during this hospitalization. 4. Diabetes Mellitus. Continue accuchecks q ac q hs with SSI.   Code Status: full code Disposition Plan: Plan to discharge tomorrow morning if he remains stable   Consultants:  Nephrology  GI  Procedures:  EGD on 10/25/13 and 10/27/13    HPI/Subjective: Patient denied further episodes of bloody stools. He denies shortness of breath, chest pain, fevers or chills although states having less of an appetite.  Objective: Filed Vitals:   10/29/13 1313  BP: 125/61  Pulse: 81  Temp: 98.5 F (36.9 C)  Resp: 22    Intake/Output Summary (Last 24 hours) at 10/29/13 1747 Last data filed at 10/29/13 1314  Gross per 24 hour  Intake 2683.58 ml  Output      0 ml  Net 2683.58 ml   Filed Weights   10/28/13 0720 10/28/13 1130 10/29/13 0609  Weight: 101 kg (222 lb 10.6 oz) 98.2 kg (216 lb 7.9 oz) 100.699 kg (222 lb)    Exam:  General: No distress,  Eyes: PERRL, EOMI, no scleral icterus  Cardiovascular: regular rate without MRG; 2+ peripheral pulses  Respiratory: CTA biL, good air movement without wheezing, rhonchi or crackles  Abdomen: soft, non tender to palpation, positive bowel sounds, no guarding, no rebound  Musculoskeletal:  no peripheral edema; surgical site on the right thigh well-healed, no bleeding, no discharges.  Neurologic: Grossly nonfocal   Data Reviewed: Basic Metabolic Panel:  Recent Labs Lab 10/24/13 1435 10/25/13 0605 10/28/13 0540  NA 138 136 133*  K 3.4* 3.7 4.1  CL 94* 94* 91*  CO2 28 27 25   GLUCOSE 113* 162* 158*  BUN 37* 47* 69*  CREATININE 4.51* 5.74* 6.73*  CALCIUM 9.9 9.6 9.6  PHOS  --   --  5.6*   Liver Function Tests:  Recent Labs Lab 10/24/13 1435 10/25/13 0605 10/28/13 0540  AST 24 24  --   ALT 20 22  --   ALKPHOS 136* 141*  --   BILITOT 0.4 0.4  --   PROT 6.6 6.4  --   ALBUMIN 3.1* 3.0* 2.7*   No results found for this basename: LIPASE, AMYLASE,  in the last 168 hours No results found for this basename: AMMONIA,  in the last 168 hours CBC:  Recent Labs Lab 10/24/13 1435  10/25/13 0605 10/26/13 0610 10/27/13 0625 10/28/13 0540 10/29/13 0603  WBC 9.4  < > 8.2 10.2 11.4* 12.4* 10.6*  NEUTROABS 7.0  --   --   --   --   --   --   HGB 6.6*  < > 7.0* 8.1* 6.7* 9.1* 8.6*  HCT 20.5*  < > 22.1* 24.6* 20.6* 27.9* 26.8*  MCV 97.6  < > 96.5 94.6 96.7 94.6 97.1  PLT 280  < >  259 250 246 149* 248  < > = values in this interval not displayed. Cardiac Enzymes: No results found for this basename: CKTOTAL, CKMB, CKMBINDEX, TROPONINI,  in the last 168 hours BNP (last 3 results) No results found for this basename: PROBNP,  in the last 8760 hours CBG:  Recent Labs Lab 10/28/13 1659 10/28/13 2126 10/29/13 0731 10/29/13 1132 10/29/13 1626  GLUCAP 146* 183* 185* 148* 151*    Recent Results (from the past 240 hour(s))  MRSA PCR SCREENING     Status: None   Collection Time    10/24/13  6:02 PM      Result Value Range Status   MRSA by PCR NEGATIVE  NEGATIVE Final   Comment:            The GeneXpert MRSA Assay (FDA     approved for NASAL specimens     only), is one component of a     comprehensive MRSA colonization     surveillance program. It is not      intended to diagnose MRSA     infection nor to guide or     monitor treatment for     MRSA infections.     Studies: No results found.  Scheduled Meds: . darbepoetin (ARANESP) injection - DIALYSIS  200 mcg Intravenous Q Fri-HD  . docusate sodium  100 mg Oral BID  . doxercalciferol  6 mcg Intravenous Q M,W,F-HD  . feeding supplement (RESOURCE BREEZE)  1 Container Oral BID BM  . insulin aspart  0-9 Units Subcutaneous TID WC  . lanthanum  1,000 mg Oral TID WC  . multivitamin  1 tablet Oral QHS  . pantoprazole  40 mg Oral BID  . 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 If 7PM-7AM, please contact night-coverage at [email or web address], password [facility] 10/29/2013, 5:47 PM  LOS: 5 days

## 2013-10-30 LAB — CBC
HCT: 25.9 % — ABNORMAL LOW (ref 39.0–52.0)
MCV: 96.3 fL (ref 78.0–100.0)
Platelets: 245 10*3/uL (ref 150–400)
RBC: 2.69 MIL/uL — ABNORMAL LOW (ref 4.22–5.81)
WBC: 9 10*3/uL (ref 4.0–10.5)

## 2013-10-30 LAB — GLUCOSE, CAPILLARY
Glucose-Capillary: 157 mg/dL — ABNORMAL HIGH (ref 70–99)
Glucose-Capillary: 96 mg/dL (ref 70–99)
Glucose-Capillary: 160 mg/dL — ABNORMAL HIGH (ref 70–99)

## 2013-10-30 MED ORDER — LACTULOSE 10 GM/15ML PO SOLN: 20.0000 g | Freq: Two times a day (BID) | ORAL | Status: DC | PRN

## 2013-10-30 MED ORDER — LANTHANUM CARBONATE 1000 MG PO CHEW: 1000.0000 mg | CHEWABLE_TABLET | Freq: Three times a day (TID) | ORAL | Status: DC

## 2013-10-30 MED ORDER — ASPIRIN EC 81 MG PO TBEC: 81.0000 mg | DELAYED_RELEASE_TABLET | Freq: Every day | ORAL | Status: DC

## 2013-10-30 MED ORDER — SUCRALFATE 1 G PO TABS: 1.0000 g | ORAL_TABLET | Freq: Three times a day (TID) | ORAL | Status: DC

## 2013-10-30 MED ORDER — DOXERCALCIFEROL 4 MCG/2ML IV SOLN
INTRAVENOUS | Status: AC
  Administered 2013-10-30: 6 ug via INTRAVENOUS
  Filled 2013-10-30: qty 4

## 2013-10-30 MED ORDER — PANTOPRAZOLE SODIUM 40 MG PO TBEC: 40.0000 mg | DELAYED_RELEASE_TABLET | Freq: Two times a day (BID) | ORAL | Status: DC

## 2013-10-30 NOTE — Evaluation (Signed)
Physical Therapy Evaluation Patient Details Name: [name] MRN: [id number] DOB: [birth year] Today's Date: 10/30/2013 Time: 4098-1191 PT Time Calculation (min): 15 min  PT Assessment / Plan / Recommendation History of Present Illness  Pt adm and treated for decreased Hgb secondary to GI bleed; pt recent ORIF of Rt LE secondary to distal femur fx. Pt has been at [facility] for rehab. PMH includes DM, obesity, COPD, CHF and is on HD MWF   Clinical Impression  Pt adm due to the above; presents with limitations in functional mobility secondary to deficits listed below (See PT problem list). Pt to benefit from skilled PT to maximize functional independence with mobility while maintaining NWB status. Will cont to recommend ST SNF upon acute D/C prior to returning home with wife.     PT Assessment  Patient needs continued PT services    Follow Up Recommendations  SNF;Supervision/Assistance - 24 hour    Does the patient have the potential to tolerate intense rehabilitation      Barriers to Discharge Decreased caregiver support;Inaccessible home environment pt needs to be mod I prior to returning home with incr mobility. wife unable to physically (A) pt     Equipment Recommendations  None recommended by PT    Recommendations for Other Services     Frequency Min 2X/week    Precautions / Restrictions Precautions Precautions: Fall Precaution Comments: recent fall at home Restrictions Weight Bearing Restrictions: Yes RLE Weight Bearing: Non weight bearing   Pertinent Vitals/Pain No c/o pain. See VSS      Mobility  Bed Mobility Bed Mobility: Supine to Sit;Sitting - Scoot to Edge of Bed;Sit to Supine;Scooting to [facility] Supine to Sit: 5: Supervision;HOB elevated;With rails Sitting - Scoot to Edge of Bed: 5: Supervision;With rail Sit to Supine: 5: Supervision;HOB flat;With rail Scooting to HOB: 1: +2 Total assist Scooting to [facility]: Patient Percentage: 60% Details for Bed  Mobility Assistance: pt able to manuever in bed with increased time; relies heavily on handrails for mobility; min cues to maintain NWB on Rt LE Transfers Transfers: Not assessed (pt refused at this time ) Ambulation/Gait Ambulation/Gait Assistance: Not tested (comment) Stairs: No Wheelchair Mobility Wheelchair Mobility: No    Exercises General Exercises - Lower Extremity Long Arc Quad: AROM;Both;10 reps;Seated;Other (comment) (cues to control muslce contraction ) Hip Flexion/Marching: AROM;Both;10 reps;Seated   PT Diagnosis: Difficulty walking;Generalized weakness  PT Problem List: Decreased strength;Decreased activity tolerance;Decreased balance;Decreased mobility;Decreased knowledge of use of DME;Decreased safety awareness PT Treatment Interventions: DME instruction;Gait training;Therapeutic activities;Functional mobility training;Therapeutic exercise;Balance training;Neuromuscular re-education;Patient/family education;Wheelchair mobility training     PT Goals(Current goals can be found in the care plan section) Acute Rehab PT Goals Patient Stated Goal: to get some food  PT Goal Formulation: With patient Time For Goal Achievement: 11/06/13 Potential to Achieve Goals: Good  Visit Information  Last PT Received On: 10/30/13 Assistance Needed: +2 History of Present Illness: Pt adm and treated for decreased Hgb secondary to GI bleed; pt recent ORIF of Rt LE secondary to distal femur fx. Pt has been at [facility] for rehab. PMH includes DM, obesity, COPD, CHF and is on HD MWF        Prior Functioning  Home Living Family/patient expects to be discharged to:: Skilled nursing facility Living Arrangements: Spouse/significant other Available Help at Discharge: Family;Available 24 hours/day Type of Home: House Home Access: Stairs to enter [employer] of Steps: 5 Entrance Stairs-Rails: Right Home Layout: One level Home Equipment: Walker - 4 wheels;Shower seat;Bedside  commode  Additional Comments: has tub shower with seat Prior Function Level of Independence: Needs assistance Gait / Transfers Assistance Needed: pt reports he is independent with gt and transfers to bathroom at home with rollator  prior to recent admission and femur fx.  Communication Communication: No difficulties Dominant Hand: Right    Cognition  Cognition Arousal/Alertness: Awake/alert Behavior During Therapy: WFL for tasks assessed/performed Overall Cognitive Status: Within Functional Limits for tasks assessed    Extremity/Trunk Assessment Upper Extremity Assessment Upper Extremity Assessment: Defer to OT evaluation Lower Extremity Assessment Lower Extremity Assessment: Generalized weakness RLE Sensation:  (WFL to light touch ) Cervical / Trunk Assessment Cervical / Trunk Assessment: Kyphotic   Balance Balance Balance Assessed: Yes Static Sitting Balance Static Sitting - Balance Support: Feet unsupported;Bilateral upper extremity supported;No upper extremity supported Static Sitting - Level of Assistance: 5: Stand by assistance Static Sitting - Comment/# of Minutes: pt tolerated sitting EOB ~4 min   End of Session PT - End of Session Activity Tolerance: Patient tolerated treatment well Patient left: in bed;with call bell/phone within reach;with nursing/sitter in room;with family/visitor present Nurse Communication: Mobility status;Weight bearing status  GP     Donell Sievert, [place] [phone] 10/30/2013, 2:47 PM

## 2013-10-30 NOTE — Clinical Social Work Note (Addendum)
Patient medically stable for discharge back to [facility] today. Facility notified, discharge packet compiled, ambulance authorization received from [facility] - [id number]. Ambulance will be called to transport patient to facility. Family at bedside.  Genelle Bal, MSW, LCSW [phone]

## 2013-10-30 NOTE — Progress Notes (Signed)
[facility] PROGRESS NOTE  [name] JXB:[id number] DOB: 1941/09/24 DOA: 10/24/2013 PCP: Pearson Grippe, MD  Pt seen today s/p dialysis. He denies any further gross bleeding, and is tolerating PO well. Wife at bedside He is AFSS. Exam:  General: alert & oriented x 3 In NAD Cardiovascular: RRR, nl S1 s2 Respiratory: decreased BS at the bases Abdomen: soft +BS NT/ND, no masses palpable Extremities: No cyanosis and no edema Lab -hgb 8.3, remains stable today  Pt remains medically stable for D/C to SNF today as previously planned.Please see details of hospital stay as per d/c summary of 11/11 addended for d/c medications reconciliation today.He is to follow up with SNF MD and Dr Rhea Belton as scheduled.    Kela Millin  [facility] Pager [phone]. If 7PM-7AM, please contact night-coverage at [email or web address], password [facility] 10/30/2013, 5:12 PM  LOS: 6 days

## 2013-10-30 NOTE — Procedures (Signed)
I was present at this dialysis session. I have reviewed the session itself and made appropriate changes.   Doing well.  Good appetite.  No evidence of bleeding.    Sabra Heck  MD 10/30/2013, 9:19 AM

## 2013-10-30 NOTE — Progress Notes (Signed)
Report given to Talbert Forest, Charity fundraiser at [facility]. Pt will be returning there tonight. SW to call ambulance.

## 2013-11-01 ENCOUNTER — Encounter: Payer: Self-pay | Admitting: Internal Medicine

## 2013-11-01 ENCOUNTER — Non-Acute Institutional Stay (SKILLED_NURSING_FACILITY): Payer: Medicare Other | Admitting: Internal Medicine

## 2013-11-01 DIAGNOSIS — E1129 Type 2 diabetes mellitus with other diabetic kidney complication: Secondary | ICD-10-CM

## 2013-11-01 DIAGNOSIS — S7290XD Unspecified fracture of unspecified femur, subsequent encounter for closed fracture with routine healing: Secondary | ICD-10-CM

## 2013-11-01 DIAGNOSIS — K31819 Angiodysplasia of stomach and duodenum without bleeding: Secondary | ICD-10-CM

## 2013-11-01 DIAGNOSIS — D62 Acute posthemorrhagic anemia: Secondary | ICD-10-CM

## 2013-11-01 DIAGNOSIS — N186 End stage renal disease: Secondary | ICD-10-CM

## 2013-11-01 DIAGNOSIS — S72401D Unspecified fracture of lower end of right femur, subsequent encounter for closed fracture with routine healing: Secondary | ICD-10-CM

## 2013-11-01 DIAGNOSIS — R531 Weakness: Secondary | ICD-10-CM

## 2013-11-01 DIAGNOSIS — N058 Unspecified nephritic syndrome with other morphologic changes: Secondary | ICD-10-CM

## 2013-11-01 DIAGNOSIS — R5381 Other malaise: Secondary | ICD-10-CM

## 2013-11-01 NOTE — Progress Notes (Signed)
Patient ID: [name], male   DOB: Feb 18, 1941, 72 y.o.   MRN: [id number]  [name] living [name]   PCP: [name] Grippe, MD  Code Status: full code  Allergies  Allergen Reactions  . Penicillins Rash  . Sulfa Antibiotics Rash  . Glucophage [Metformin Hydrochloride] Rash    Chief Complaint: New admit, post hospitalization  HPI:  72 y/o male patient is here post hospital admission from 10/24/13- 10/29/13 with GI bleed. He has history of ESRD on dialysis, IDDM, HTN, femoral fracture s/p ORIF and was here for rehab prior to this hospitalization for his right femoral fracture. He had dark stools and drop in hemoglobin to 6.2. He had PRBC transfused and was started on protonix drip. GI team was consulted. He then underwent EGD on 10/25/13 showing 4 ulcers in the duodenal bulb. Epinephrine injection was given and cauterization was done. He tolerated the procedure well. Following this he again had 3 bloody bowel movements and hb of 6.7. He further had blood transfusion and repeat EGD, he then had clip placed and epinephrine injection of an angiodysplastic lesion. His hemoglobin remained stable following this. He was then sent to SNF for rehabilitation. He was seen in his room today. He denies any complaints. No further blood in his stool. He feels weak and tired. Has occassional loose stool but no abdominal pain, nausea or vomiting  Review of Systems  Constitutional: Negative for fever, chills, weight loss, malaise/fatigue and diaphoresis.  HENT: Negative for congestion, hearing loss and sore throat.   Eyes: Negative for blurred vision, double vision and discharge.  Respiratory: Negative for cough, sputum production, shortness of breath and wheezing.   Cardiovascular: Negative for chest pain, palpitations, orthopnea and leg swelling.  Gastrointestinal: Negative for heartburn, nausea, vomiting, abdominal pain, diarrhea and constipation.  Genitourinary: Negative for dysuria, urgency, frequency and  flank pain.  Musculoskeletal: Negative for back pain, falls, joint pain and myalgias.  Skin: Negative for itching and rash.  Neurological: Positive for weakness. Negative for dizziness, tingling, focal weakness and headaches.  Psychiatric/Behavioral: Negative for depression and memory loss. The patient is not nervous/anxious.     Past Medical History  Diagnosis Date  . Hypertension   . Atrial flutter   . Obesity   . COPD (chronic obstructive pulmonary disease)   . Gout   . CHF (congestive heart failure)   . Anemia   . ESRD (end stage renal disease) on dialysis     "[facility]" (10/24/2013)   . Type II diabetes mellitus   . History of blood transfusion 2014    "only once, related to my dialysis" (12/24/2012)  . Arthritis     "arms and hands" (10/24/2013)  . Gout   . Chronic back pain     "since had shot in it to put me asleep w/hip OR" (10/24/2013)  . Anxiety   . Depression    Past Surgical History  Procedure Laterality Date  . Arteriovenous graft placement  2008    left arm  . Av fistula placement Left 07/02/2013    Procedure: INSERTION OF ARTERIOVENOUS (AV) GORE-TEX GRAFT ARM;  Surgeon: Chuck Hint, MD;  Location: [facility] OR;  Service: Vascular;  Laterality: Left;  . Ligation arteriovenous gortex graft Left 07/02/2013    Procedure: LIGATION ARTERIOVENOUS GORTEX GRAFT;  Surgeon: Chuck Hint, MD;  Location: [facility] OR;  Service: Vascular;  Laterality: Left;  . Insertion of dialysis catheter N/A 07/02/2013    Procedure: INSERTION OF DIALYSIS CATHETER;  Surgeon:  Chuck Hint, MD;  Location: [facility] OR;  Service: Vascular;  Laterality: N/A;  Left Internal Jugular Placement  . Orif femur fracture Right 10/01/2013    Procedure: OPEN REDUCTION INTERNAL FIXATION (ORIF) DISTAL FEMUR FRACTURE;  Surgeon: Mable Paris, MD;  Location: [facility] OR;  Service: Orthopedics;  Laterality: Right;  . Cholecystectomy    . Total hip arthroplasty Right   . Joint replacement    .  Esophagogastroduodenoscopy N/A 10/25/2013    Procedure: ESOPHAGOGASTRODUODENOSCOPY (EGD);  Surgeon: Beverley Fiedler, MD;  Location: [facility] ENDOSCOPY;  Service: Endoscopy;  Laterality: N/A;  . Esophagogastroduodenoscopy N/A 10/27/2013    Procedure: ESOPHAGOGASTRODUODENOSCOPY (EGD);  Surgeon: Beverley Fiedler, MD;  Location: [facility] ENDOSCOPY;  Service: Endoscopy;  Laterality: N/A;   Social History:   reports that he quit smoking about 28 years ago. His smoking use included Cigarettes. He has a 15.5 pack-year smoking history. He has never used smokeless tobacco. He reports that he does not drink alcohol or use illicit drugs.  Family History  Problem Relation Age of Onset  . Heart attack Mother   . Heart attack Father   . Diabetes Sister   . Diabetes Brother     Medications: Patient's Medications  New Prescriptions   No medications on file  Previous Medications   ALLOPURINOL (ZYLOPRIM) 300 MG TABLET    Take 150 mg by mouth daily.    ASPIRIN EC 81 MG TABLET    Take 1 tablet (81 mg total) by mouth daily.   FISH OIL-OMEGA-3 FATTY ACIDS 1000 MG CAPSULE    Take 1 g by mouth daily.   HYDROCODONE-ACETAMINOPHEN (NORCO/VICODIN) 5-325 MG PER TABLET    Take 1 tablet by mouth every 8 (eight) hours as needed for moderate pain.   INSULIN ASPART (NOVOLOG) 100 UNIT/ML INJECTION    Inject 0-5 Units into the skin 4 (four) times daily -  before meals and at bedtime. 0 -150 = 0 units,   151+= 5 units   LACTULOSE (CHRONULAC) 10 GM/15ML SOLUTION    Take 30 mLs (20 g total) by mouth 2 (two) times daily as needed for moderate constipation.   LANTHANUM (FOSRENOL) 1000 MG CHEWABLE TABLET    Chew 1 tablet (1,000 mg total) by mouth 3 (three) times daily with meals.   MULTIVITAMIN (RENA-VIT) TABS TABLET    Take 1 tablet by mouth daily.   NUTRITIONAL SUPPLEMENTS (FEEDING SUPPLEMENT, NEPRO CARB STEADY,) LIQD    Take 237 mLs by mouth 3 (three) times a week. On Monday, Wednesday, and Friday   PANTOPRAZOLE (PROTONIX) 40 MG TABLET    Take 1  tablet (40 mg total) by mouth 2 (two) times daily.   SUCRALFATE (CARAFATE) 1 G TABLET    Take 1 tablet (1 g total) by mouth 4 (four) times daily -  with meals and at bedtime.   VITAMIN C (ASCORBIC ACID) 500 MG TABLET    Take 500 mg by mouth daily.  Modified Medications   No medications on file  Discontinued Medications   No medications on file     Physical Exam: Filed Vitals:   11/01/13 0719  BP: 117/60  Pulse: 88  Temp: 97.7 F (36.5 C)  Resp: 18  Height: 5\' 11"  (1.803 m)  Weight: 222 lb (100.699 kg)  SpO2: 98%   General- elderly male in no acute distress Head- atraumatic, normocephalic Eyes- PERRLA, EOMI, no pallor, no icterus, no discharge Neck- no lymphadenopathy, no thyromegaly, no jugular vein distension, no carotid bruit Cardiovascular- normal s1,s2, no  murmurs/ rubs/ gallops Respiratory- bilateral clear to auscultation, no wheeze, no rhonchi, no crackles Abdomen- bowel sounds present, soft, non tender Musculoskeletal- able to move all 4 extremities, weakness present Neurological- no focal deficit Psychiatry- alert and oriented to person, place and time, normal mood and affect   Labs reviewed: Basic Metabolic Panel:  Recent Labs  10/14/24 0944  10/04/13 0800 10/24/13 1435 10/25/13 0605 10/28/13 0540  NA  --   < > 132* 138 136 133*  K  --   < > 4.3 3.4* 3.7 4.1  CL  --   < > 91* 94* 94* 91*  CO2  --   < > 27 28 27 25   GLUCOSE  --   < > 184* 113* 162* 158*  BUN  --   < > 37* 37* 47* 69*  CREATININE  --   < > 6.20* 4.51* 5.74* 6.73*  CALCIUM  --   < > 9.9 9.9 9.6 9.6  PHOS 4.7*  --  4.8*  --   --  5.6*  < > = values in this interval not displayed. Liver Function Tests:  Recent Labs  10/04/13 0700  10/24/13 1435 10/25/13 0605 10/28/13 0540  AST 54*  --  24 24  --   ALT 25  --  20 22  --   ALKPHOS 119*  --  136* 141*  --   BILITOT 0.3  --  0.4 0.4  --   PROT 6.8  --  6.6 6.4  --   ALBUMIN 2.8*  < > 3.1* 3.0* 2.7*  < > = values in this interval not  displayed. No results found for this basename: LIPASE, AMYLASE,  in the last 8760 hours No results found for this basename: AMMONIA,  in the last 8760 hours CBC:  Recent Labs  10/01/13 0305  10/24/13 1435  10/28/13 0540 10/29/13 0603 10/30/13 0700  WBC 18.9*  < > 9.4  < > 12.4* 10.6* 9.0  NEUTROABS 17.3*  --  7.0  --   --   --   --   HGB 9.7*  < > 6.6*  < > 9.1* 8.6* 8.3*  HCT 30.3*  < > 20.5*  < > 27.9* 26.8* 25.9*  MCV 93.2  < > 97.6  < > 94.6 97.1 96.3  PLT 275  < > 280  < > 149* 248 245  < > = values in this interval not displayed.  CBG:  Recent Labs  10/30/13 0750 10/30/13 1311 10/30/13 1705  GLUCAP 157* 96 160*    Radiological Exams: Dg Chest 1 View  10/01/2013   CLINICAL DATA:  Fall with no chest complaints.  EXAM: CHEST - 1 VIEW  COMPARISON:  07/02/2013.  FINDINGS: Cardiomegaly. Calcified tortuous aorta. Mild vascular congestion. Mild basilar atelectasis. Osseous demineralization. Dialysis catheter has been removed since the previous radiograph.  IMPRESSION: Cardiomegaly without active infiltrates or failure. Improved aeration from priors.   Electronically Signed   By: Davonna Belling M.D.   On: 10/01/2013 01:57   Dg Chest 2 View  10/03/2013   CLINICAL DATA:  Hypoxia, short of breath  EXAM: CHEST  2 VIEW  COMPARISON:  Most recent prior chest x-ray 10/01/2013  FINDINGS: Stable mild cardiomegaly. Mediastinal contours are unchanged. Atherosclerotic calcification noted in the transverse aorta. Inspiratory volumes are slightly lower. There is persistent of horizontal atelectasis versus scarring in the bilateral bases. Similar degree of pulmonary vascular congestion without overt edema. No pleural effusion or pneumothorax. Metallic stents project over  the heart. No acute osseous abnormality.  IMPRESSION: Slightly lower inspiratory volumes. Otherwise, no acute cardiopulmonary process.  Stable vascular congestion without overt edema and bibasilar atelectasis versus scarring.    Electronically Signed   By: Malachy Moan M.D.   On: 10/03/2013 13:35   Dg Hip Complete Right  10/01/2013   *RADIOLOGY REPORT*  Clinical Data: Right knee pain, fall.  History of right hip surgery.  RIGHT HIP - COMPLETE 2+ VIEW  Comparison: None available at time of study interpretation.  Findings: Five images of the pelvis and right hip.  Status post right hip total arthroplasty, hardware appears intact well seated without periprosthetic lucency.  No fracture deformity or dislocation.  Severe left hip joint space narrowing with mild marginal spurring.  Sacroiliac joints are symmetric.  Bone mineral density is decreased without destructive bony lesions.  Moderate vascular calcification.  Periarticular soft tissue planes are not suspicious.  IMPRESSION: No acute fracture deformity or dislocation.  Status post right hip total arthroplasty without radiographic findings of hardware failure.  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   Original Report Authenticated By: Awilda Metro   Dg Femur Right  10/01/2013   CLINICAL DATA:  Femur fracture.  EXAM: RIGHT FEMUR - 2 VIEW; DG C-ARM 1-60 MIN  COMPARISON:  10/01/2013  FINDINGS: Intraoperative images of the right femur were obtained. A lateral femoral plate has been placed. There are proximal and distal screws. Again noted is a displaced fracture of the distal femur.  IMPRESSION: Internal fixation of the displaced distal femur fracture.   Electronically Signed   By: Richarda Overlie M.D.   On: 10/01/2013 19:16   Dg Femur Right Port  10/01/2013   CLINICAL DATA:  Status post fracture fixation.  EXAM: PORTABLE RIGHT FEMUR - 2 VIEW  COMPARISON:  Plain films earlier this same day.  FINDINGS: Plate and screws are in place for fixation of a distal femur fracture. Hardware is intact. Position and alignment are anatomic. No new abnormality is identified. Surgical staples are noted.  IMPRESSION: ORIF distal right femur fracture without acute abnormality.    Electronically Signed   By: Drusilla Kanner M.D.   On: 10/01/2013 20:45   Dg Knee Complete 4 Views Right  10/01/2013   *RADIOLOGY REPORT*  Clinical Data: Fall, knee pain.  RIGHT KNEE - COMPLETE 4+ VIEW  Comparison: None available at time of study interpretation.  Findings: Comminuted distal femur metadiaphyseal impacted fracture with slight medial deviation of the distal bony fragments.  No definite interarticular extension.  No destructive bony lesions. Bone mineral density is decreased.  Moderate suprapatellar joint effusion.  Mild vascular calcifications.  IMPRESSION: Minimally displaced distal femur impacted metadiaphyseal fracture without dislocation.  Osteopenia.   Original Report Authenticated By: Awilda Metro   Dg C-arm 1-60 Min  10/01/2013   CLINICAL DATA:  Femur fracture.  EXAM: RIGHT FEMUR - 2 VIEW; DG C-ARM 1-60 MIN  COMPARISON:  10/01/2013  FINDINGS: Intraoperative images of the right femur were obtained. A lateral femoral plate has been placed. There are proximal and distal screws. Again noted is a displaced fracture of the distal femur.  IMPRESSION: Internal fixation of the displaced distal femur fracture.   Electronically Signed   By: Richarda Overlie M.D.   On: 10/01/2013 19:16   Procedures:  EGD performed on 10/25/2013, which showed 4 ulcers in the duodenal bulb, status post injection with epinephrine and cautery.  EGD performed on 10/27/2013, patient undergoing clip placement of duodenal bulb ulcer as well as epinephrine  injection of angioplasty lesion.  Consultations:  Nephrology  Gastroenterology  Assessment/Plan  Duodenal ulcer- discharge summary to the facility did not have protonix and sucralfate and thus patient has not received it in the facility until today. Will start him on pantoprazole 40 mg bid and sucralfate 1 g four times a day. No further bleed reported. Change ASA to ASA EC for above reason  Blood loss anemia- from gi bleed, monitor h/h. No further bleed  reported  ESRD- on dialysis 3 days a week and will restart his phosrenol for now  Dm type 2- continue premeal insulin, monitor cbg, also will check a1c as no recent a1c in system  Generalized weakness- will have him work with therapy team for muscle strengthening exercises, fall precautions  Femoral fracture s/p ORIF- will have him work with PT and OT, fall precautions, gait training, pain under control, continue his norco prn for now. Is non weight bearing in right leg and has follow up with orthopedic  Hyperlipidemia- continue fish oil supplement, monitor clinically, also on ASA 81 mg daily. Will change this to [facility] ASA given his recent bleed  Gout- no recent attack, continue allopurinol  Family/ staff Communication: reviewed care plan with patient and nursing supervisor   Goals of care: to return home   Labs/tests ordered: cbc, cmp, a1c

## 2013-11-11 ENCOUNTER — Encounter: Payer: Self-pay | Admitting: Internal Medicine

## 2013-11-11 NOTE — Progress Notes (Signed)
Patient ID: [name], male   DOB: Sep 27, 1941, 72 y.o.   MRN: [id number] Notes on PE:   Right soft cast LE  Requested small meals  ASA 325mg  twice a day for a month as DVT prophylaxis per ortho.  F/u cbc next draw.

## 2013-11-28 ENCOUNTER — Ambulatory Visit: Payer: Medicare Other | Admitting: Internal Medicine

## 2013-12-02 ENCOUNTER — Non-Acute Institutional Stay (SKILLED_NURSING_FACILITY): Payer: Medicare Other | Admitting: Internal Medicine

## 2013-12-02 ENCOUNTER — Encounter: Payer: Self-pay | Admitting: Internal Medicine

## 2013-12-02 DIAGNOSIS — E1122 Type 2 diabetes mellitus with diabetic chronic kidney disease: Secondary | ICD-10-CM

## 2013-12-02 DIAGNOSIS — N186 End stage renal disease: Secondary | ICD-10-CM

## 2013-12-02 DIAGNOSIS — D72829 Elevated white blood cell count, unspecified: Secondary | ICD-10-CM

## 2013-12-02 DIAGNOSIS — R0609 Other forms of dyspnea: Secondary | ICD-10-CM

## 2013-12-02 DIAGNOSIS — E1129 Type 2 diabetes mellitus with other diabetic kidney complication: Secondary | ICD-10-CM

## 2013-12-02 DIAGNOSIS — S72409A Unspecified fracture of lower end of unspecified femur, initial encounter for closed fracture: Secondary | ICD-10-CM

## 2013-12-02 DIAGNOSIS — S72401A Unspecified fracture of lower end of right femur, initial encounter for closed fracture: Secondary | ICD-10-CM

## 2013-12-02 DIAGNOSIS — R0600 Dyspnea, unspecified: Secondary | ICD-10-CM

## 2013-12-02 DIAGNOSIS — K264 Chronic or unspecified duodenal ulcer with hemorrhage: Secondary | ICD-10-CM

## 2013-12-02 NOTE — Progress Notes (Signed)
Patient ID: [name], male   DOB: 04-01-1941, 73 y.o.   MRN: [id number]    [name] living [employer]  Chief Complaint  Patient presents with  . Medical Managment of Chronic Issues   Allergies  Allergen Reactions  . Penicillins Rash  . Sulfa Antibiotics Rash  . Glucophage [Metformin Hydrochloride] Rash   HPI 72 y/o male patient is here for STR. He is seen today for routine visit. he has history of ESRD on dialysis, IDDM, HTN, femoral fracture s/p ORIF. He has lost 7 lbs since admission. He complaints of knee pain limiting him working with therapy. He also has dyspnea with exertion. Reviewed his recent labs. No further blood in stool reported. He has been working with therapy team. He is able to walk 50 ft with rollator walker  Review of Systems  Constitutional: Negative for fever, chills,malaise/fatigue and diaphoresis.  HENT: Negative for congestion, hearing loss and sore throat.   Eyes: Negative for blurred vision, double vision and discharge.  Respiratory: Negative for cough, sputum production, shortness of breath and wheezing.   Cardiovascular: Negative for chest pain, palpitations, orthopnea and leg swelling.  Gastrointestinal: Negative for heartburn, nausea, vomiting, abdominal pain, diarrhea and constipation.  Genitourinary: Negative for dysuria, urgency, frequency and flank pain.  Musculoskeletal: Negative for back pain, falls and myalgias.  Skin: Negative for itching and rash.  Neurological: Positive for weakness. Negative for dizziness, tingling, focal weakness and headaches.  Psychiatric/Behavioral: Negative for depression and memory loss. The patient is not nervous/anxious.    Past Medical History  Diagnosis Date  . Hypertension   . Atrial flutter   . Obesity   . COPD (chronic obstructive pulmonary disease)   . Gout   . CHF (congestive heart failure)   . Anemia   . ESRD (end stage renal disease) on dialysis     "[facility]; [facility]- [facility]" (10/24/2013)   . Type II  diabetes mellitus   . History of blood transfusion 2014    "only once, related to my dialysis" (12/24/2012)  . Arthritis     "arms and hands" (10/24/2013)  . Gout   . Chronic back pain     "since had shot in it to put me asleep w/hip OR" (10/24/2013)  . Anxiety   . Depression   . Acute duodenal ulcer with hemorrhage, without mention of obstruction   . Hemorrhage of gastrointestinal tract, unspecified    Medication reviewed. See [facility]  Physical exam BP 131/53  Pulse 80  Temp(Src) 98.1 F (36.7 C)  Resp 18  Ht 5\' 11"  (1.803 m)  Wt 215 lb (97.523 kg)  BMI 30.00 kg/m2  SpO2 98%  General- elderly male in no acute distress Head- atraumatic, normocephalic Eyes- PERRLA, EOMI, no pallor, no icterus, no discharge Neck- no lymphadenopathy, no thyromegaly, no jugular vein distension, no carotid bruit Cardiovascular- normal s1,s2, no murmurs/ rubs/ gallops Respiratory- bilateral clear to auscultation, no wheeze, no rhonchi, no crackles Abdomen- bowel sounds present, soft, non tender Musculoskeletal- able to move all 4 extremities, weakness present Neurological- no focal deficit Psychiatry- alert and oriented to person, place and time, normal mood and affect  Labs- 11/04/13 Wbc 13.6, hb 8.5, hct 26.9, plt 297, na 140, k 3.9, bun 50, cr 6.53, alp 144, a1c 6  Assessment/plan  Leukocytosis- no active signs of infection. Recheck cbc with diff. Monitor temperature curve  Dm type 2- reviewed a1c of 6. Stop premeal insulin for now. Check cb on wekly basis and reassess  Dyspnea- on exertion.  No documented hx of chf. Has hx of anemia and this could be contributing some along with his deconditioning. To work with therapy team. No signs of fluid overload. Also check cbc to rule out worsening of anemi  Femoral fracture s/p ORIF- will have him continue to work with PT and OT, fall precautions, gait training. Will need pain med prior to going for therapy. continue his norco prn for now. Continue  aspirin EC  Duodenal ulcer- continue pantoprazole 40 mg bid and sucralfate 1 g four times a day. No further bleed reported.   ESRD- on dialysis 3 days a week and continue phosrenol for now  Gout- no recent attack, continue allopurinol  Family/ staff Communication: reviewed care plan with patient and nursing supervisor   Goals of care: to return home   Labs/tests ordered: cbc, cmp, a1c

## 2013-12-03 ENCOUNTER — Other Ambulatory Visit: Payer: Self-pay | Admitting: *Deleted

## 2013-12-03 MED ORDER — HYDROCODONE-ACETAMINOPHEN 5-325 MG PO TABS: ORAL_TABLET | ORAL | Status: DC

## 2013-12-20 ENCOUNTER — Encounter: Payer: Self-pay | Admitting: Internal Medicine

## 2013-12-24 ENCOUNTER — Encounter: Payer: Self-pay | Admitting: Internal Medicine

## 2013-12-24 ENCOUNTER — Ambulatory Visit (INDEPENDENT_AMBULATORY_CARE_PROVIDER_SITE_OTHER): Payer: Medicare Other | Admitting: Internal Medicine

## 2013-12-24 VITALS — BP 90/38 | HR 80 | Ht 71.0 in | Wt 218.6 lb

## 2013-12-24 DIAGNOSIS — D638 Anemia in other chronic diseases classified elsewhere: Secondary | ICD-10-CM

## 2013-12-24 DIAGNOSIS — N186 End stage renal disease: Secondary | ICD-10-CM

## 2013-12-24 DIAGNOSIS — Z992 Dependence on renal dialysis: Secondary | ICD-10-CM

## 2013-12-24 DIAGNOSIS — K279 Peptic ulcer, site unspecified, unspecified as acute or chronic, without hemorrhage or perforation: Secondary | ICD-10-CM

## 2013-12-24 DIAGNOSIS — D509 Iron deficiency anemia, unspecified: Secondary | ICD-10-CM

## 2013-12-24 DIAGNOSIS — K31819 Angiodysplasia of stomach and duodenum without bleeding: Secondary | ICD-10-CM

## 2013-12-24 MED ORDER — PANTOPRAZOLE SODIUM 40 MG PO TBEC: 40.0000 mg | DELAYED_RELEASE_TABLET | Freq: Two times a day (BID) | ORAL | Status: AC

## 2013-12-24 NOTE — Progress Notes (Signed)
Patient ID: [name], male   DOB: 1941/07/28, 73 y.o.   MRN: [id number] HPI: [name] is a 73 yo male with PMH of end-stage renal disease on dialysis, CAPD, atrial flutter, CHF, diabetes, chronic back pain, and peptic ulcer disease with GI bleed in early November 2014 who is seen in hospital followup. He is here today with his wife. He remains in rehabilitation after undergoing ORIF of the distal right femur fracture in October 2014. During his hospitalization he had 2 upper endoscopy on 10/25/2013 and 10/27/2013.  Initial endoscopy showed changes suspicious for Barrett's esophagus, and 4 duodenal ulcers one with a pigmented spot in the duodenal bulb. The pigmented spot was treated with epinephrine injection and gold probe cautery. 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 One with a visible vessel which was treated with epinephrine injection and clipping.  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 H. pylori antibody was checked and negative.   He was eventually discharged back to skilled nursing where he remains. He reports he is feeling well. He denies any recent rectal bleeding or melena. He reports he has had no further indigestion since his hospitalization. No trouble swallowing. No nausea or vomiting. He continues on pantoprazole twice daily and sucralfate 4 times daily. He reports his blood counts have been followed at hemodialysis and last he heard they were "improving". He is moving more and is able to get up out of bed using a walker which is progress for him. He is unsure when he will be able to be discharged to home   Past Medical History  Diagnosis Date  . Hypertension   . Atrial flutter   . Obesity   . COPD (chronic obstructive pulmonary disease)   . Gout    . CHF (congestive heart failure)   . Anemia   . ESRD (end stage renal disease) on dialysis     "[facility]; M- W F" (10/24/2013)   . Type II diabetes mellitus   . History of blood transfusion 2014    "only once, related to my dialysis" (12/24/2012)  . Arthritis     "arms and hands" (10/24/2013)  . Gout   . Chronic back pain     "since had shot in it to put me asleep w/hip OR" (10/24/2013)  . Anxiety   . Depression   . Acute duodenal ulcer with hemorrhage, without mention of obstruction   . Hemorrhage of gastrointestinal tract, unspecified     Past Surgical History  Procedure Laterality Date  . Arteriovenous graft placement  2008    left arm  . Av fistula placement Left 07/02/2013    Procedure: INSERTION OF ARTERIOVENOUS (AV) GORE-TEX GRAFT ARM;  Surgeon: Chuck Hint, MD;  Location: [facility] OR;  Service: Vascular;  Laterality: Left;  . Ligation arteriovenous gortex graft Left 07/02/2013    Procedure: LIGATION ARTERIOVENOUS GORTEX GRAFT;  Surgeon: Chuck Hint, MD;  Location: [facility] OR;  Service: Vascular;  Laterality: Left;  . Insertion of dialysis catheter N/A 07/02/2013    Procedure: INSERTION OF DIALYSIS CATHETER;  Surgeon: Chuck Hint, MD;  Location: [facility] OR;  Service: Vascular;  Laterality: N/A;  Left Internal Jugular Placement  . Orif femur fracture Right 10/01/2013    Procedure: OPEN REDUCTION INTERNAL FIXATION (ORIF) DISTAL  FEMUR FRACTURE;  Surgeon: Mable Paris, MD;  Location: [facility] OR;  Service: Orthopedics;  Laterality: Right;  . Cholecystectomy    . Total hip arthroplasty Right   . Esophagogastroduodenoscopy N/A 10/25/2013    Procedure: ESOPHAGOGASTRODUODENOSCOPY (EGD);  Surgeon: Beverley Fiedler, MD;  Location: [facility] ENDOSCOPY;  Service: Endoscopy;  Laterality: N/A;  . Esophagogastroduodenoscopy N/A 10/27/2013    Procedure: ESOPHAGOGASTRODUODENOSCOPY (EGD);  Surgeon: Beverley Fiedler, MD;  [facility] ENDOSCOPY;  Service: Endoscopy;  Laterality: N/A;    Current  Outpatient Prescriptions  Medication Sig Dispense Refill  . allopurinol (ZYLOPRIM) 300 MG tablet Take 150 mg by mouth daily.       Marland Kitchen aspirin 81 MG tablet Take 81 mg by mouth daily.      Marland Kitchen HYDROcodone-acetaminophen (NORCO/VICODIN) 5-325 MG per tablet Take one tablet by mouth every 8 hours as needed for pain  90 tablet  0  . lactulose (CHRONULAC) 10 GM/15ML solution Take 30 g by mouth 2 (two) times daily as needed for mild constipation.       Marland Kitchen lanthanum (FOSRENOL) 1000 MG chewable tablet Chew 1,000 mg by mouth 3 (three) times daily with meals.      . multivitamin (RENA-VIT) TABS tablet Take 1 tablet by mouth daily.      . pantoprazole (PROTONIX) 40 MG tablet Take 1 tablet (40 mg total) by mouth 2 (two) times daily.  60 tablet  3  . traZODone (DESYREL) 50 MG tablet Take 50 mg by mouth at bedtime.      . vitamin C (ASCORBIC ACID) 500 MG tablet Take 500 mg by mouth daily.      Marland Kitchen ZINC OXIDE, TOPICAL, 10 % CREA Apply topically. To buttocks daily      . fish oil-omega-3 fatty acids 1000 MG capsule Take 1 g by mouth daily.      . insulin aspart (NOVOLOG) 100 UNIT/ML injection Inject 0-5 Units into the skin 4 (four) times daily -  before meals and at bedtime. 0 -150 = 0 units,   151+= 5 units       No current facility-administered medications for this visit.    Allergies  Allergen Reactions  . Penicillins Rash  . Sulfa Antibiotics Rash  . Glucophage [Metformin Hydrochloride] Rash    Family History  Problem Relation Age of Onset  . Heart attack Mother   . Heart attack Father   . Diabetes Sister   . Diabetes Brother   . Colon cancer Neg Hx   . Throat cancer Neg Hx   . Stomach cancer Neg Hx   . Inflammatory bowel disease Neg Hx   . Kidney disease Brother   . Liver disease Neg Hx     History  Substance Use Topics  . Smoking status: Former Smoker -- 0.50 packs/day for 31 years    Types: Cigarettes    Quit date: 12/19/1984  . Smokeless tobacco: Never Used  . Alcohol Use: No     ROS: As per history of present illness, otherwise negative  BP 90/38  Pulse 80  Ht 5\' 11"  (1.803 m)  Wt 218 lb 9.6 oz (99.156 kg)  BMI 30.50 kg/m2 Constitutional: Well-developed and well-nourished. No distress. Chronically ill appearing in a wheelchair HEENT: Normocephalic and atraumatic. Oropharynx is clear and moist. No oropharyngeal exudate.  No scleral icterus. Cardiovascular: Normal rate, regular rhythm and intact distal pulses.  Pulmonary/chest: Effort normal and breath sounds normal. No wheezing, rales or rhonchi. Abdominal: Soft,  obese, nontender, nondistended. Bowel sounds active  throughout.  Extremities: no clubbing, cyanosis,  trace pretibial edema with hyperpigmentation and changes of chronic venous stasis bilateral anterior shins  Neurological: Alert and oriented to person place and time. Skin: Skin is warm and dry.scattered ecchymosis without rash  Psychiatric: Normal mood and affect. Behavior is normal.  RELEVANT LABS AND IMAGING: CBC    Component Value Date/Time   WBC 9.0 10/30/2013 0700   RBC 2.69* 10/30/2013 0700   RBC 4.03* 10/24/2007 1700   HGB 8.3* 10/30/2013 0700   HCT 25.9* 10/30/2013 0700   PLT 245 10/30/2013 0700   MCV 96.3 10/30/2013 0700   MCH 30.9 10/30/2013 0700   MCHC 32.0 10/30/2013 0700   RDW 19.3* 10/30/2013 0700   LYMPHSABS 1.3 10/24/2013 1435   MONOABS 0.9 10/24/2013 1435   EOSABS 0.2 10/24/2013 1435   BASOSABS 0.1 10/24/2013 1435    CMP     Component Value Date/Time   NA 133* 10/28/2013 0540   K 4.1 10/28/2013 0540   CL 91* 10/28/2013 0540   CO2 25 10/28/2013 0540   GLUCOSE 158* 10/28/2013 0540   BUN 69* 10/28/2013 0540   CREATININE 6.73* 10/28/2013 0540   CALCIUM 9.6 10/28/2013 0540   CALCIUM 9.5 10/16/2007 0315   PROT 6.4 10/25/2013 0605   ALBUMIN 2.7* 10/28/2013 0540   AST 24 10/25/2013 0605   ALT 22 10/25/2013 0605   ALKPHOS 141* 10/25/2013 0605   BILITOT 0.4 10/25/2013 0605   GFRNONAA 7* 10/28/2013 0540   GFRAA 8*  10/28/2013 0540   Labs from dialysis clinic Hgb: 12/17/13 = 9.1 12/10/13 = 10.4 12/04/13 = 10.7 11/27/13 = 9.5 11/20/12 = 8.8  12/04/2013: Iron = 37 (low), TIBC 193, Transferrin sat 19% (Low)   Epo given 12/23/13 Venofer 100 mg on 11/22/13, 11/20/13, 11/18/13, 11/15/13, 11/13/13, 11/11/13   ASSESSMENT/PLAN: 73 yo male with PMH of end-stage renal disease on dialysis, CAPD, atrial flutter, CHF,Gastric and duodenal ulcer/gastric angiodysplasia/iron deficiency anemia diabetes, chronic back pain, and peptic ulcer disease with GI bleed in early November 2014 who is seen in hospital followup.   1.  Gastric and duodenal ulcers/gastric angiodysplasia/iron deficiency anemia -- no recent overt bleeding and his hemoglobin was previously improving. However on last checked on 12/17/2013 his hemoglobin dropped from 10.4 to 9.1. He denies overt melena.  It appears his anemia is mixed picture/chronic disease as one would expect however his percent saturation remains low. He last received IV iron on 11/22/2013, and percent saturation remained low as of 12/04/2013.  At this point I would like for him to remain on twice daily pantoprazole. He can likely discontinue Carafate and I will discontinue it today. We need to closely monitor hemoglobin and iron stores going forward. I recommend repeat IV administration with dialysis. I will fax a copy of my note to his nephrologist Dr. Kathrene BongoGoldsborough. He was H. pylori negative and I expect his ulcers were NSAID related. He is off NSAIDs now. --Continue twice a day PPI --Discontinue sucralfate --Closely monitor hemoglobin --IV iron per renal team --Office visit in 6 weeks --Call with any overt melena or rectal bleeding. --We discussed repeat upper endoscopy, but at this time we'll hold off any further trend hemoglobin. Would repeat endoscopy in the event of melena or further drop in hemoglobin  The plan was discussed in detail with the patient and his wife, they understand  and are in agreement.

## 2013-12-24 NOTE — Patient Instructions (Signed)
We have sent the following medications to your pharmacy for you to pick up at your convenience: Protonix twice a day.  Discontinue taking carafate.  Have you kidney dr. Berneda Roseontinue to monitor your iron studies.   Please follow up with Dr. Rhea BeltonPyrtle in office in 6 weeks

## 2013-12-27 ENCOUNTER — Telehealth: Payer: Self-pay | Admitting: Gastroenterology

## 2013-12-27 NOTE — Telephone Encounter (Signed)
Started prior auth for Pantoprazole faxed to [id number]

## 2014-01-07 ENCOUNTER — Non-Acute Institutional Stay (SKILLED_NURSING_FACILITY): Payer: Medicare Other | Admitting: Internal Medicine

## 2014-01-07 DIAGNOSIS — E1122 Type 2 diabetes mellitus with diabetic chronic kidney disease: Secondary | ICD-10-CM

## 2014-01-07 DIAGNOSIS — Z9889 Other specified postprocedural states: Secondary | ICD-10-CM

## 2014-01-07 DIAGNOSIS — K264 Chronic or unspecified duodenal ulcer with hemorrhage: Secondary | ICD-10-CM

## 2014-01-07 DIAGNOSIS — K31819 Angiodysplasia of stomach and duodenum without bleeding: Secondary | ICD-10-CM

## 2014-01-07 DIAGNOSIS — R531 Weakness: Secondary | ICD-10-CM

## 2014-01-07 DIAGNOSIS — R5383 Other fatigue: Secondary | ICD-10-CM

## 2014-01-07 DIAGNOSIS — R5381 Other malaise: Secondary | ICD-10-CM

## 2014-01-07 DIAGNOSIS — N186 End stage renal disease: Secondary | ICD-10-CM

## 2014-01-07 DIAGNOSIS — E1129 Type 2 diabetes mellitus with other diabetic kidney complication: Secondary | ICD-10-CM

## 2014-01-07 DIAGNOSIS — Z8781 Personal history of (healed) traumatic fracture: Secondary | ICD-10-CM

## 2014-01-07 NOTE — Progress Notes (Signed)
Patient ID: [name] HarderClaude T [name], male   DOB: [birth year], 73 y.o.   MRN: [id number]  [facility]  [facility] Vivianne Carles L. Renato Gailseed, D.O., C.M.D.  PCP: Pearson GrippeKIM, JAMES, MD  Code Status: full code  Allergies  Allergen Reactions  . Penicillins Rash  . Sulfa Antibiotics Rash  . Glucophage [Metformin Hydrochloride] Rash    Chief Complaint  Patient presents with  . Discharge Note    d/c home with home health PT, OT    HPI:  73 yo male with ESRD on HD, diabetes mellitus II with renal complications, htn, previous femoral fx here for rehab s/p hospitalization 11/6-11 with GI bleed due to 4 duodenal ulcers and an angiodysplastic lesion.  He required a clip, epi injections and cauterization.  He was transfused.  While here, his hgb has been stable.  He has participated in therapy and attended his HD.  He is now ready for d/c home with PT, OT.  He has no DME needs.    Review of Systems:  Review of Systems  Constitutional: Negative for fever.  Eyes: Negative for blurred vision.  Respiratory: Negative for shortness of breath.   Cardiovascular: Negative for chest pain.  Gastrointestinal: Negative for abdominal pain, blood in stool and melena.  Genitourinary: Negative for dysuria.  Musculoskeletal: Negative for falls and myalgias.  Neurological: Negative for headaches.  Endo/Heme/Allergies:       Diabetes     Past Medical History  Diagnosis Date  . Hypertension   . Atrial flutter   . Obesity   . COPD (chronic obstructive pulmonary disease)   . Gout   . CHF (congestive heart failure)   . Anemia   . ESRD (end stage renal disease) on dialysis     "[facility]" (10/24/2013)   . Type II diabetes mellitus   . History of blood transfusion 2014    "only once, related to my dialysis" (12/24/2012)  . Arthritis     "arms and hands" (10/24/2013)  . Gout   . Chronic back pain     "since had shot in it to put me asleep w/hip OR" (10/24/2013)  . Anxiety   . Depression   . Acute duodenal  ulcer with hemorrhage, without mention of obstruction   . Hemorrhage of gastrointestinal tract, unspecified     Past Surgical History  Procedure Laterality Date  . Arteriovenous graft placement  2008    left arm  . Av fistula placement Left 07/02/2013    Procedure: INSERTION OF ARTERIOVENOUS (AV) GORE-TEX GRAFT ARM;  Surgeon: Chuck Hinthristopher S Dickson, MD;  [facility];  Service: Vascular;  Laterality: Left;  . Ligation arteriovenous gortex graft Left 07/02/2013    Procedure: LIGATION ARTERIOVENOUS GORTEX GRAFT;  Surgeon: Chuck Hinthristopher S Dickson, MD;  [facility];  Service: Vascular;  Laterality: Left;  . Insertion of dialysis catheter N/A 07/02/2013    Procedure: INSERTION OF DIALYSIS CATHETER;  Surgeon: Chuck Hinthristopher S Dickson, MD;  [facility];  Service: Vascular;  Laterality: N/A;  Left Internal Jugular Placement  . Orif femur fracture Right 10/01/2013    Procedure: OPEN REDUCTION INTERNAL FIXATION (ORIF) DISTAL FEMUR FRACTURE;  Surgeon: Mable ParisJustin William Chandler, MD;  [facility];  Service: Orthopedics;  Laterality: Right;  . Cholecystectomy    . Total hip arthroplasty Right   . Esophagogastroduodenoscopy N/A 10/25/2013    Procedure: ESOPHAGOGASTRODUODENOSCOPY (EGD);  Surgeon: Beverley FiedlerJay M Pyrtle, MD;  [facility];  Service: Endoscopy;  Laterality: N/A;  . Esophagogastroduodenoscopy N/A 10/27/2013  Procedure: ESOPHAGOGASTRODUODENOSCOPY (EGD);  Surgeon: Beverley Fiedler, MD;  Location: [facility] ENDOSCOPY;  Service: Endoscopy;  Laterality: N/A;    Social History:   reports that he quit smoking about 29 years ago. His smoking use included Cigarettes. He has a 15.5 pack-year smoking history. He has never used smokeless tobacco. He reports that he does not drink alcohol or use illicit drugs.  Family History  Problem Relation Age of Onset  . Heart attack Mother   . Heart attack Father   . Diabetes Sister   . Diabetes Brother   . Colon cancer Neg Hx   . Throat cancer Neg Hx   . Stomach  cancer Neg Hx   . Inflammatory bowel disease Neg Hx   . Kidney disease Brother   . Liver disease Neg Hx     Medications: Patient's Medications  New Prescriptions   No medications on file  Previous Medications   ALLOPURINOL (ZYLOPRIM) 300 MG TABLET    Take 150 mg by mouth daily.    ASPIRIN 81 MG TABLET    Take 81 mg by mouth daily.   FISH OIL-OMEGA-3 FATTY ACIDS 1000 MG CAPSULE    Take 1 g by mouth daily.   HYDROCODONE-ACETAMINOPHEN (NORCO/VICODIN) 5-325 MG PER TABLET    Take one tablet by mouth every 8 hours as needed for pain   INSULIN ASPART (NOVOLOG) 100 UNIT/ML INJECTION    Inject 0-5 Units into the skin 4 (four) times daily -  before meals and at bedtime. 0 -150 = 0 units,   151+= 5 units   LACTULOSE (CHRONULAC) 10 GM/15ML SOLUTION    Take 30 g by mouth 2 (two) times daily as needed for mild constipation.    LANTHANUM (FOSRENOL) 1000 MG CHEWABLE TABLET    Chew 1,000 mg by mouth 3 (three) times daily with meals.   MULTIVITAMIN (RENA-VIT) TABS TABLET    Take 1 tablet by mouth daily.   PANTOPRAZOLE (PROTONIX) 40 MG TABLET    Take 1 tablet (40 mg total) by mouth 2 (two) times daily.   TRAZODONE (DESYREL) 50 MG TABLET    Take 50 mg by mouth at bedtime.   VITAMIN C (ASCORBIC ACID) 500 MG TABLET    Take 500 mg by mouth daily.   ZINC OXIDE, TOPICAL, 10 % CREA    Apply topically. To buttocks daily  Modified Medications   No medications on file  Discontinued Medications   No medications on file    Physical Exam: Filed Vitals:   05/23/14 1551  BP: 154/79  Pulse: 93  Temp: 97.8 F (36.6 C)  Resp: 20  Height: 5\' 11"  (1.803 m)  Weight: 217 lb (98.431 kg)  SpO2: 96%   Physical Exam  Constitutional: He is oriented to person, place, and time. No distress.  Cardiovascular: Normal rate, regular rhythm and normal heart sounds.   AV fistula with normal thrill and pulse  Pulmonary/Chest: Effort normal and breath sounds normal.  Abdominal: Soft. Bowel sounds are normal. He exhibits no  distension and no mass. There is no tenderness.  Musculoskeletal: Normal range of motion.  Neurological: He is alert and oriented to person, place, and time.  Skin: Skin is warm and dry.  Psychiatric: He has a normal mood and affect.    Labs reviewed: Basic Metabolic Panel:  Recent Labs  96/04/54 0944  10/04/13 0800 10/24/13 1435 10/25/13 0605 10/28/13 0540  NA  --   < > 132* 138 136 133*  K  --   < >  4.3 3.4* 3.7 4.1  CL  --   < > 91* 94* 94* 91*  CO2  --   < > 27 28 27 25   GLUCOSE  --   < > 184* 113* 162* 158*  BUN  --   < > 37* 37* 47* 69*  CREATININE  --   < > 6.20* 4.51* 5.74* 6.73*  CALCIUM  --   < > 9.9 9.9 9.6 9.6  PHOS 4.7*  --  4.8*  --   --  5.6*  < > = values in this interval not displayed. Liver Function Tests:  Recent Labs  10/04/13 0700  10/24/13 1435 10/25/13 0605 10/28/13 0540  AST 54*  --  24 24  --   ALT 25  --  20 22  --   ALKPHOS 119*  --  136* 141*  --   BILITOT 0.3  --  0.4 0.4  --   PROT 6.8  --  6.6 6.4  --   ALBUMIN 2.8*  < > 3.1* 3.0* 2.7*  < > = values in this interval not displayed. No results found for this basename: LIPASE, AMYLASE,  in the last 8760 hours No results found for this basename: AMMONIA,  in the last 8760 hours CBC:  Recent Labs  10/01/13 0305  10/24/13 1435  10/28/13 0540 10/29/13 0603 10/30/13 0700  WBC 18.9*  < > 9.4  < > 12.4* 10.6* 9.0  NEUTROABS 17.3*  --  7.0  --   --   --   --   HGB 9.7*  < > 6.6*  < > 9.1* 8.6* 8.3*  HCT 30.3*  < > 20.5*  < > 27.9* 26.8* 25.9*  MCV 93.2  < > 97.6  < > 94.6 97.1 96.3  PLT 275  < > 280  < > 149* 248 245  < > = values in this interval not displayed. Cardiac Enzymes: No results found for this basename: CKTOTAL, CKMB, CKMBINDEX, TROPONINI,  in the last 8760 hours BNP: No components found with this basename: POCBNP,  CBG:  Recent Labs  10/30/13 0750 10/30/13 1311 10/30/13 1705  GLUCAP 157* 96 160*   Assessment/Plan:   1. Duodenal ulcer with hemorrhage -has done  well with therapy and will return home -f/u h/h has been satisfactory -avoid nsaids and monitor h/h  2. End stage renal disease -cont HD tiw, cont fosrenol, renavit  3. DM type 2 causing ESRD -cont novolog meal coverage for cbg > 150  4. Gastric and duodenal angiodysplasia -also caused GI bleed -monitor h/h  5. S/P ORIF (open reduction internal fixation) fracture -due to hip fx, received therapy with improvement and to go back home with home health PT, OT  6. Generalized weakness -improved, but needs to continue therapy at home  Patient is being discharged with home health services:  PT, OT  Patient is being discharged with the following durable medical equipment:  No new needs  Patient has been advised to f/u with their PCP in 1-2 weeks to bring them up to date on their rehab stay.  They were provided with a 30 day supply of scripts for prescription medications and refills must be obtained from their PCP.

## 2014-02-04 ENCOUNTER — Ambulatory Visit: Payer: Medicare Other | Admitting: Internal Medicine

## 2014-04-01 ENCOUNTER — Encounter: Payer: Self-pay | Admitting: Internal Medicine

## 2014-05-23 ENCOUNTER — Encounter: Payer: Self-pay | Admitting: Internal Medicine

## 2014-07-03 ENCOUNTER — Encounter: Payer: Self-pay | Admitting: Internal Medicine

## 2014-10-09 ENCOUNTER — Other Ambulatory Visit: Payer: Self-pay

## 2014-10-09 DIAGNOSIS — N186 End stage renal disease: Secondary | ICD-10-CM

## 2014-10-09 DIAGNOSIS — Z01810 Encounter for preprocedural cardiovascular examination: Secondary | ICD-10-CM

## 2014-10-22 ENCOUNTER — Encounter: Payer: Self-pay | Admitting: Vascular Surgery

## 2014-10-23 ENCOUNTER — Ambulatory Visit (HOSPITAL_COMMUNITY)
Admission: RE | Admit: 2014-10-23 | Discharge: 2014-10-23 | Disposition: A | Discharge status: Home / Self Care | Payer: Medicare Other | Source: Ambulatory Visit | Attending: Vascular Surgery | Admitting: Vascular Surgery

## 2014-10-23 ENCOUNTER — Encounter: Payer: Self-pay | Admitting: Vascular Surgery

## 2014-10-23 ENCOUNTER — Ambulatory Visit (INDEPENDENT_AMBULATORY_CARE_PROVIDER_SITE_OTHER): Payer: Medicare Other | Admitting: Vascular Surgery

## 2014-10-23 ENCOUNTER — Ambulatory Visit (INDEPENDENT_AMBULATORY_CARE_PROVIDER_SITE_OTHER)
Admission: RE | Admit: 2014-10-23 | Discharge: 2014-10-23 | Disposition: A | Discharge status: Home / Self Care | Payer: Medicare Other | Source: Ambulatory Visit | Attending: Vascular Surgery | Admitting: Vascular Surgery

## 2014-10-23 VITALS — BP 129/71 | HR 94 | Ht 71.0 in | Wt 216.0 lb

## 2014-10-23 DIAGNOSIS — N186 End stage renal disease: Secondary | ICD-10-CM

## 2014-10-23 DIAGNOSIS — Z01810 Encounter for preprocedural cardiovascular examination: Secondary | ICD-10-CM | POA: Insufficient documentation

## 2014-10-23 NOTE — Progress Notes (Signed)
  [facility] [place] HISTORY AND PHYSICAL    CC: Left graft failure repeated clotting  History of Present Illness:  Patient is a 73 y.o. year old male who presents for placement of a permanent hemodialysis access. The patient is right handed.  He has had left fore arm and left upper arm AV grafts in the past.  The left upper arm graft failed after multiple thrombectomy procedures 3 weeks ago.  He is on dialysis via left IJ catheter currently M-W-F.   Other chronic medical problems include.  DM treated with Insulin and history of hypertension currently not on antihypertensive medications.        Past Medical History   Diagnosis  Date   .  Hypertension     .  Atrial flutter     .  Obesity     .  COPD (chronic obstructive pulmonary disease)     .  Gout     .  CHF (congestive heart failure)     .  Anemia     .  ESRD (end stage renal disease) on dialysis         "[facility]; M- W F" (10/24/2013)    .  Type II diabetes mellitus     .  History of blood transfusion  2014       "only once, related to my dialysis" (12/24/2012)   .  Arthritis         "arms and hands" (10/24/2013)   .  Gout     .  Chronic back pain         "since had shot in it to put me asleep w/hip OR" (10/24/2013)   .  Anxiety     .  Depression     .  Acute duodenal ulcer with hemorrhage, without mention of obstruction     .  Hemorrhage of gastrointestinal tract, unspecified         Past Surgical History   Procedure  Laterality  Date   .  Arteriovenous graft placement    2008       left arm   .  Av fistula placement  Left  07/02/2013       Procedure: INSERTION OF ARTERIOVENOUS (AV) GORE-TEX GRAFT ARM;  Surgeon: Christopher S Dickson, MD;  [facility]: [facility] OR;  Service: Vascular;  Laterality: Left;   .  Ligation arteriovenous gortex graft  Left  07/02/2013       Procedure: LIGATION ARTERIOVENOUS GORTEX GRAFT;  Surgeon: Christopher S Dickson, MD;  Location: [facility] OR;  Service: Vascular;  Laterality: Left;   .   Insertion of dialysis catheter  N/A  07/02/2013       Procedure: INSERTION OF DIALYSIS CATHETER;  Surgeon: Christopher S Dickson, MD;  Location: [facility] OR;  Service: Vascular;  Laterality: N/A;  Left Internal Jugular Placement   .  Orif femur fracture  Right  10/01/2013       Procedure: OPEN REDUCTION INTERNAL FIXATION (ORIF) DISTAL FEMUR FRACTURE;  Surgeon: Justin William Chandler, MD;  Location: [facility] OR;  Service: Orthopedics;  Laterality: Right;   .  Cholecystectomy       .  Total hip arthroplasty  Right     .  Esophagogastroduodenoscopy  N/A  10/25/2013       Procedure: ESOPHAGOGASTRODUODENOSCOPY (EGD);  Surgeon: Jay M Pyrtle, MD;  Location: [facility];  Service: Endoscopy;  Laterality: N/A;   .  Esophagogastroduodenoscopy  N/A  10/27/2013         Procedure: ESOPHAGOGASTRODUODENOSCOPY (EGD);  Surgeon: Jay M Pyrtle, MD;  Location: [facility];  Service: Endoscopy;  Laterality: N/A;      Social History History   Substance Use Topics   .  Smoking status:  Former Smoker -- 0.50 packs/day for 31 years       Types:  Cigarettes       Quit date:  12/19/1984   .  Smokeless tobacco:  Never Used   .  Alcohol Use:  No     Family History Family History   Problem  Relation  Age of Onset   .  Heart attack  Mother     .  Heart attack  Father     .  Diabetes  Sister     .  Diabetes  Brother     .  Colon cancer  Neg Hx     .  Throat cancer  Neg Hx     .  Stomach cancer  Neg Hx     .  Inflammatory bowel disease  Neg Hx     .  Kidney disease  Brother     .  Liver disease  Neg Hx       Allergies    Allergies   Allergen  Reactions   .  Penicillins  Rash   .  Sulfa Antibiotics  Rash   .  Glucophage [Metformin Hydrochloride]  Rash        Current Outpatient Prescriptions   Medication  Sig  Dispense  Refill   .  allopurinol (ZYLOPRIM) 300 MG tablet  Take 150 mg by mouth daily.        .  aspirin 81 MG tablet  Take 81 mg by mouth daily.       .  HUMALOG 100 UNIT/ML injection  Inject into the  skin.     5   .  midodrine (PROAMATINE) 10 MG tablet  Take 10 mg by mouth. 1 tablet 30 minutes before dialysis       .  multivitamin (RENA-VIT) TABS tablet  Take 1 tablet by mouth daily.       .  fish oil-omega-3 fatty acids 1000 MG capsule  Take 1 g by mouth daily.       .  HYDROcodone-acetaminophen (NORCO/VICODIN) 5-325 MG per tablet  Take one tablet by mouth every 8 hours as needed for pain  90 tablet  0   .  insulin aspart (NOVOLOG) 100 UNIT/ML injection  Inject 0-5 Units into the skin 4 (four) times daily -  before meals and at bedtime. 0 -150 = 0 units,  151+= 5 units       .  lactulose (CHRONULAC) 10 GM/15ML solution  Take 30 g by mouth 2 (two) times daily as needed for mild constipation.        .  lanthanum (FOSRENOL) 1000 MG chewable tablet  Chew 1,000 mg by mouth 3 (three) times daily with meals.       .  pantoprazole (PROTONIX) 40 MG tablet  Take 1 tablet (40 mg total) by mouth 2 (two) times daily.  60 tablet  3   .  traZODone (DESYREL) 50 MG tablet  Take 50 mg by mouth at bedtime.       .  vitamin C (ASCORBIC ACID) 500 MG tablet  Take 500 mg by mouth daily.       .  ZINC OXIDE, TOPICAL, 10 % CREA  Apply topically. To buttocks daily            No current facility-administered medications for this visit.     ROS:    General:  No weight loss, Fever, chills  HEENT: Pos recent headaches, no nasal bleeding, no visual changes, no sore throat  Neurologic: No dizziness, blackouts, seizures. No recent symptoms of stroke or mini- stroke. No recent episodes of slurred speech, or temporary blindness.  Cardiac: No recent episodes of chest pain/pressure, no shortness of breath at rest.  No shortness of breath with exertion.  Denies history of atrial fibrillation or irregular heartbeat  Vascular: No history of rest pain in feet.  No history of claudication.  No history of non-healing ulcer, No history of DVT    Pulmonary: No home oxygen, no productive cough, no hemoptysis,  No asthma or  wheezing  Musculoskeletal:  [ ] Arthritis, [ ] Low back pain,  [ ] Joint pain  Hematologic:No history of hypercoagulable state.  No history of easy bleeding.  No history of anemia  Gastrointestinal: No hematochezia or melena,  No gastroesophageal reflux, no trouble swallowing  Urinary: [ ] chronic Kidney disease, [ ] on HD - [ ] MWF or [ ] TTHS, [ ] Burning with urination, [ ] Frequent urination, [ ] Difficulty urinating;    Skin: No rashes  Psychological: No history of anxiety,  No history of depression   Physical Examination    Filed Vitals:     10/23/14 1350   BP:  129/71   Pulse:  94   Height:  5' 11" (1.803 m)   Weight:  216 lb (97.977 kg)   SpO2:  97%     Body mass index is 30.14 kg/(m^2).  General:  Alert and oriented, no acute distress HEENT: Normal Neck: No bruit or JVD Pulmonary: Clear to auscultation bilaterally Cardiac: Regular Rate and Rhythm without murmur Gastrointestinal: Soft, non-tender, non-distended, no mass, no scars Skin: No rash Extremity Pulses:  2+ radial, brachial pulses right Musculoskeletal: No deformity or edema     Neurologic: Upper and lower extremity motor 5/5 and symmetric.  He has bilateral intrathenar musculature waisting in both hands.  Cause unknown.  DATA:   Right upper extremity vein mapping 10/23/2014 Right 0.48-0.23 Basilic    ASSESSMENT:   ESRD He has had 2 grafts in the past the most recent failed 3 weeks ago and he is now in need of new access.   PLAN: Right AV fistula creation Basilic vein transposition. He was seen in conjunction with Dr. Fields today.  Tryce Surratt MAUREEN PA-C [facility] [place] Office: [phone]  History and exam details as above. Patient has had multiple failures of left upper extremity AV grafts. He currently is dialyzing via left sided catheter. His dialysis today is Monday Wednesday Friday. He does have a reasonable vein in the right arm for possible basilic vein  transposition fistula. I did discuss with the patient at length today risk benefits possible complications and procedure details including the possibility that the fistula may not mature. Other small risks of bleeding infection ischemic steal.  He understands and agrees to proceed.  His fistula placement is scheduled for early December.  Charles Fields, MD [facility] [place] Office: [phone] Pager: [phone]  

## 2014-10-27 ENCOUNTER — Other Ambulatory Visit: Payer: Self-pay

## 2014-11-24 ENCOUNTER — Encounter (HOSPITAL_COMMUNITY): Payer: Self-pay | Admitting: *Deleted

## 2014-11-24 MED ORDER — VANCOMYCIN HCL IN DEXTROSE 1-5 GM/200ML-% IV SOLN
1000.0000 mg | INTRAVENOUS | Status: AC
  Administered 2014-11-25: 1000 mg via INTRAVENOUS
  Filled 2014-11-24: qty 200

## 2014-11-24 NOTE — Anesthesia Preprocedure Evaluation (Addendum)
Anesthesia Evaluation  Patient identified by MRN, date of birth, ID band Patient awake    Reviewed: Allergy & Precautions, H&P , NPO status , Patient's Chart, lab work & pertinent test results, reviewed documented beta blocker date and time   Airway Mallampati: II   Neck ROM: Full    Dental  (+) Partial Lower, Partial Upper, Dental Advisory Given   Pulmonary COPDformer smoker (quit 1986),  breath sounds clear to auscultation        Cardiovascular hypertension, Pt. on medications Rhythm:Regular  EKG RBB   Neuro/Psych Anxiety Depression    GI/Hepatic   Endo/Other  diabetes, Poorly Controlled, Insulin Dependent  Renal/GU DialysisRenal disease     Musculoskeletal   Abdominal (+)  Abdomen: soft.    Peds  Hematology   Anesthesia Other Findings   Reproductive/Obstetrics                            Anesthesia Physical Anesthesia Plan  ASA: III  Anesthesia Plan: General   Post-op Pain Management:    Induction: Intravenous  Airway Management Planned: LMA and Oral ETT  Additional Equipment:   Intra-op Plan:   Post-operative Plan:   Informed Consent: I have reviewed the patients History and Physical, chart, labs and discussed the procedure including the risks, benefits and alternatives for the proposed anesthesia with the patient or authorized representative who has indicated his/her understanding and acceptance.     Plan Discussed with:   Anesthesia Plan Comments: (Need am labs)        Anesthesia Quick Evaluation

## 2014-11-25 ENCOUNTER — Other Ambulatory Visit: Payer: Self-pay | Admitting: *Deleted

## 2014-11-25 ENCOUNTER — Encounter (HOSPITAL_COMMUNITY): Payer: Self-pay | Admitting: *Deleted

## 2014-11-25 ENCOUNTER — Encounter (HOSPITAL_COMMUNITY)
Admission: RE | Disposition: A | Discharge status: Home / Self Care | Payer: Self-pay | Source: Ambulatory Visit | Attending: Vascular Surgery

## 2014-11-25 ENCOUNTER — Ambulatory Visit (HOSPITAL_COMMUNITY): Payer: Medicare Other | Admitting: Anesthesiology

## 2014-11-25 ENCOUNTER — Ambulatory Visit (HOSPITAL_COMMUNITY)
Admission: RE | Admit: 2014-11-25 | Discharge: 2014-11-25 | Disposition: A | Discharge status: Home / Self Care | Payer: Medicare Other | Source: Ambulatory Visit | Attending: Vascular Surgery | Admitting: Vascular Surgery

## 2014-11-25 DIAGNOSIS — F329 Major depressive disorder, single episode, unspecified: Secondary | ICD-10-CM | POA: Insufficient documentation

## 2014-11-25 DIAGNOSIS — N186 End stage renal disease: Secondary | ICD-10-CM

## 2014-11-25 DIAGNOSIS — Z7982 Long term (current) use of aspirin: Secondary | ICD-10-CM | POA: Insufficient documentation

## 2014-11-25 DIAGNOSIS — E119 Type 2 diabetes mellitus without complications: Secondary | ICD-10-CM | POA: Diagnosis not present

## 2014-11-25 DIAGNOSIS — Z6830 Body mass index (BMI) 30.0-30.9, adult: Secondary | ICD-10-CM | POA: Diagnosis not present

## 2014-11-25 DIAGNOSIS — Z87891 Personal history of nicotine dependence: Secondary | ICD-10-CM | POA: Diagnosis not present

## 2014-11-25 DIAGNOSIS — F419 Anxiety disorder, unspecified: Secondary | ICD-10-CM | POA: Insufficient documentation

## 2014-11-25 DIAGNOSIS — I509 Heart failure, unspecified: Secondary | ICD-10-CM | POA: Insufficient documentation

## 2014-11-25 DIAGNOSIS — Z4931 Encounter for adequacy testing for hemodialysis: Secondary | ICD-10-CM

## 2014-11-25 DIAGNOSIS — M109 Gout, unspecified: Secondary | ICD-10-CM | POA: Diagnosis not present

## 2014-11-25 DIAGNOSIS — E669 Obesity, unspecified: Secondary | ICD-10-CM | POA: Diagnosis not present

## 2014-11-25 DIAGNOSIS — Z992 Dependence on renal dialysis: Secondary | ICD-10-CM | POA: Insufficient documentation

## 2014-11-25 DIAGNOSIS — J449 Chronic obstructive pulmonary disease, unspecified: Secondary | ICD-10-CM | POA: Diagnosis not present

## 2014-11-25 DIAGNOSIS — I120 Hypertensive chronic kidney disease with stage 5 chronic kidney disease or end stage renal disease: Secondary | ICD-10-CM | POA: Diagnosis not present

## 2014-11-25 HISTORY — PX: BASCILIC VEIN TRANSPOSITION: SHX5742

## 2014-11-25 HISTORY — DX: Reserved for inherently not codable concepts without codable children: IMO0001

## 2014-11-25 HISTORY — DX: Pneumonia, unspecified organism: J18.9

## 2014-11-25 LAB — POCT I-STAT 4, (NA,K, GLUC, HGB,HCT)
GLUCOSE: 139 mg/dL — AB (ref 70–99)
HCT: 38 % — ABNORMAL LOW (ref 39.0–52.0)
HEMOGLOBIN: 12.9 g/dL — AB (ref 13.0–17.0)
Potassium: 4.3 mEq/L (ref 3.7–5.3)
Sodium: 138 mEq/L (ref 137–147)

## 2014-11-25 LAB — GLUCOSE, CAPILLARY
GLUCOSE-CAPILLARY: 147 mg/dL — AB (ref 70–99)
Glucose-Capillary: 118 mg/dL — ABNORMAL HIGH (ref 70–99)

## 2014-11-25 SURGERY — TRANSPOSITION, VEIN, BASILIC
Anesthesia: General | Site: Arm Upper | Laterality: Right

## 2014-11-25 MED ORDER — 0.9 % SODIUM CHLORIDE (POUR BTL) OPTIME
TOPICAL | Status: DC | PRN
  Administered 2014-11-25: 1000 mL

## 2014-11-25 MED ORDER — FENTANYL CITRATE 0.05 MG/ML IJ SOLN
25.0000 ug | INTRAMUSCULAR | Status: DC | PRN
  Administered 2014-11-25: 50 ug via INTRAVENOUS
  Administered 2014-11-25: 25 ug via INTRAVENOUS

## 2014-11-25 MED ORDER — THROMBIN 20000 UNITS EX SOLR
CUTANEOUS | Status: AC
  Filled 2014-11-25: qty 20000

## 2014-11-25 MED ORDER — MIDAZOLAM HCL 2 MG/2ML IJ SOLN
INTRAMUSCULAR | Status: AC
  Filled 2014-11-25: qty 2

## 2014-11-25 MED ORDER — SUCCINYLCHOLINE CHLORIDE 20 MG/ML IJ SOLN
INTRAMUSCULAR | Status: AC
  Filled 2014-11-25: qty 1

## 2014-11-25 MED ORDER — HEPARIN SODIUM (PORCINE) 1000 UNIT/ML IJ SOLN
1000.0000 [IU] | Freq: Once | INTRAMUSCULAR | Status: DC
  Administered 2014-11-25: 1000 [IU] via INTRAVENOUS

## 2014-11-25 MED ORDER — LIDOCAINE HCL (PF) 1 % IJ SOLN
INTRAMUSCULAR | Status: AC
  Filled 2014-11-25: qty 30

## 2014-11-25 MED ORDER — LIDOCAINE-EPINEPHRINE (PF) 1 %-1:200000 IJ SOLN
INTRAMUSCULAR | Status: AC
  Filled 2014-11-25: qty 10

## 2014-11-25 MED ORDER — PROPOFOL 10 MG/ML IV BOLUS
INTRAVENOUS | Status: AC
  Filled 2014-11-25: qty 20

## 2014-11-25 MED ORDER — HYDROCODONE-ACETAMINOPHEN 5-325 MG PO TABS: ORAL_TABLET | ORAL | Status: AC

## 2014-11-25 MED ORDER — PROPOFOL 10 MG/ML IV BOLUS
INTRAVENOUS | Status: DC | PRN
  Administered 2014-11-25: 100 mg via INTRAVENOUS

## 2014-11-25 MED ORDER — CHLORHEXIDINE GLUCONATE CLOTH 2 % EX PADS: 6.0000 | MEDICATED_PAD | Freq: Once | CUTANEOUS | Status: DC

## 2014-11-25 MED ORDER — EPHEDRINE SULFATE 50 MG/ML IJ SOLN
INTRAMUSCULAR | Status: AC
  Filled 2014-11-25: qty 1

## 2014-11-25 MED ORDER — LIDOCAINE HCL (CARDIAC) 20 MG/ML IV SOLN
INTRAVENOUS | Status: AC
  Filled 2014-11-25: qty 5

## 2014-11-25 MED ORDER — PHENYLEPHRINE HCL 10 MG/ML IJ SOLN
10.0000 mg | INTRAVENOUS | Status: DC | PRN
  Administered 2014-11-25: 50 ug/min via INTRAVENOUS

## 2014-11-25 MED ORDER — HEPARIN SODIUM (PORCINE) 1000 UNIT/ML IJ SOLN
INTRAMUSCULAR | Status: DC | PRN
  Administered 2014-11-25: 7000 [IU] via INTRAVENOUS

## 2014-11-25 MED ORDER — ONDANSETRON HCL 4 MG/2ML IJ SOLN
INTRAMUSCULAR | Status: DC | PRN
  Administered 2014-11-25: 4 mg via INTRAVENOUS

## 2014-11-25 MED ORDER — PHENYLEPHRINE HCL 10 MG/ML IJ SOLN
INTRAMUSCULAR | Status: DC | PRN
  Administered 2014-11-25: 80 ug via INTRAVENOUS

## 2014-11-25 MED ORDER — SODIUM CHLORIDE 0.9 % IV SOLN: INTRAVENOUS | Status: DC

## 2014-11-25 MED ORDER — HEPARIN SODIUM (PORCINE) 1000 UNIT/ML IJ SOLN
INTRAMUSCULAR | Status: AC
  Filled 2014-11-25: qty 1

## 2014-11-25 MED ORDER — FENTANYL CITRATE 0.05 MG/ML IJ SOLN
INTRAMUSCULAR | Status: DC | PRN
  Administered 2014-11-25 (×5): 25 ug via INTRAVENOUS
  Administered 2014-11-25: 50 ug via INTRAVENOUS
  Administered 2014-11-25 (×3): 25 ug via INTRAVENOUS

## 2014-11-25 MED ORDER — SODIUM CHLORIDE 0.9 % IJ SOLN
INTRAMUSCULAR | Status: AC
  Filled 2014-11-25: qty 10

## 2014-11-25 MED ORDER — SODIUM CHLORIDE 0.9 % IR SOLN
Status: DC | PRN
  Administered 2014-11-25: 07:00:00

## 2014-11-25 MED ORDER — PROMETHAZINE HCL 25 MG/ML IJ SOLN: 6.2500 mg | INTRAMUSCULAR | Status: DC | PRN

## 2014-11-25 MED ORDER — FENTANYL CITRATE 0.05 MG/ML IJ SOLN
INTRAMUSCULAR | Status: AC
  Filled 2014-11-25: qty 5

## 2014-11-25 MED ORDER — MEPERIDINE HCL 25 MG/ML IJ SOLN: 6.2500 mg | INTRAMUSCULAR | Status: DC | PRN

## 2014-11-25 MED ORDER — SODIUM CHLORIDE 0.9 % IV SOLN
INTRAVENOUS | Status: DC | PRN
  Administered 2014-11-25: 07:00:00 via INTRAVENOUS

## 2014-11-25 MED ORDER — LIDOCAINE HCL (CARDIAC) 20 MG/ML IV SOLN
INTRAVENOUS | Status: DC | PRN
  Administered 2014-11-25: 60 mg via INTRAVENOUS

## 2014-11-25 MED ORDER — PROTAMINE SULFATE 10 MG/ML IV SOLN
INTRAVENOUS | Status: DC | PRN
  Administered 2014-11-25 (×7): 10 mg via INTRAVENOUS

## 2014-11-25 MED ORDER — PROTAMINE SULFATE 10 MG/ML IV SOLN
INTRAVENOUS | Status: AC
  Filled 2014-11-25: qty 10

## 2014-11-25 MED ORDER — ONDANSETRON HCL 4 MG/2ML IJ SOLN
INTRAMUSCULAR | Status: AC
  Filled 2014-11-25: qty 2

## 2014-11-25 MED ORDER — FENTANYL CITRATE 0.05 MG/ML IJ SOLN
INTRAMUSCULAR | Status: DC
Start: 2014-11-25 — End: 2014-11-25
  Filled 2014-11-25: qty 2

## 2014-11-25 SURGICAL SUPPLY — 33 items
CANISTER SUCTION 2500CC (MISCELLANEOUS) ×2 IMPLANT
CANNULA VESSEL 3MM 2 BLNT TIP (CANNULA) ×1 IMPLANT
CLIP TI MEDIUM 24 (CLIP) ×2 IMPLANT
CLIP TI WIDE RED SMALL 24 (CLIP) ×2 IMPLANT
COVER PROBE W GEL 5X96 (DRAPES) ×2 IMPLANT
COVER SURGICAL LIGHT HANDLE (MISCELLANEOUS) ×3 IMPLANT
DECANTER SPIKE VIAL GLASS SM (MISCELLANEOUS) ×2 IMPLANT
ELECT REM PT RETURN 9FT ADLT (ELECTROSURGICAL) ×2
ELECTRODE REM PT RTRN 9FT ADLT (ELECTROSURGICAL) ×1 IMPLANT
GLOVE BIO SURGEON STRL SZ 6.5 (GLOVE) ×2 IMPLANT
GLOVE BIO SURGEON STRL SZ7.5 (GLOVE) ×2 IMPLANT
GLOVE BIOGEL PI IND STRL 6.5 (GLOVE) IMPLANT
GLOVE BIOGEL PI INDICATOR 6.5 (GLOVE) ×3
GLOVE ECLIPSE 6.5 STRL STRAW (GLOVE) ×3 IMPLANT
GOWN STRL REUS W/ TWL LRG LVL3 (GOWN DISPOSABLE) ×3 IMPLANT
GOWN STRL REUS W/TWL LRG LVL3 (GOWN DISPOSABLE) ×10
KIT BASIN OR (CUSTOM PROCEDURE TRAY) ×2 IMPLANT
KIT ROOM TURNOVER OR (KITS) ×2 IMPLANT
LIQUID BAND (GAUZE/BANDAGES/DRESSINGS) ×4 IMPLANT
NS IRRIG 1000ML POUR BTL (IV SOLUTION) ×2 IMPLANT
PACK CV ACCESS (CUSTOM PROCEDURE TRAY) ×2 IMPLANT
PAD ARMBOARD 7.5X6 YLW CONV (MISCELLANEOUS) ×4 IMPLANT
PROBE PENCIL 8 MHZ STRL DISP (MISCELLANEOUS) ×1 IMPLANT
SUT PROLENE 6 0 BV (SUTURE) ×1 IMPLANT
SUT PROLENE 7 0 BV 1 (SUTURE) ×2 IMPLANT
SUT SILK 2 0 SH (SUTURE) ×2 IMPLANT
SUT SILK 3 0 (SUTURE) ×6
SUT SILK 3-0 18XBRD TIE 12 (SUTURE) IMPLANT
SUT VIC AB 3-0 SH 27 (SUTURE) ×8
SUT VIC AB 3-0 SH 27X BRD (SUTURE) ×1 IMPLANT
SUT VICRYL 4-0 PS2 18IN ABS (SUTURE) ×5 IMPLANT
UNDERPAD 30X30 INCONTINENT (UNDERPADS AND DIAPERS) ×2 IMPLANT
WATER STERILE IRR 1000ML POUR (IV SOLUTION) ×2 IMPLANT

## 2014-11-25 NOTE — Anesthesia Procedure Notes (Signed)
Procedure Name: LMA Insertion Date/Time: 11/25/2014 7:42 AM Performed by: Orvilla FusATO, Assunta Pupo A Pre-anesthesia Checklist: Patient identified, Emergency Drugs available, Suction available, Patient being monitored and Timeout performed Patient Re-evaluated:Patient Re-evaluated prior to inductionOxygen Delivery Method: Circle system utilized Preoxygenation: Pre-oxygenation with 100% oxygen Intubation Type: IV induction Ventilation: Mask ventilation without difficulty LMA: LMA inserted LMA Size: 5.0 Number of attempts: 1 Placement Confirmation: positive ETCO2 and breath sounds checked- equal and bilateral Tube secured with: Tape Dental Injury: Teeth and Oropharynx as per pre-operative assessment

## 2014-11-25 NOTE — Anesthesia Postprocedure Evaluation (Signed)
  Anesthesia Post-op Note  Patient: [name]  Procedure(s) Performed: Procedure(s): BASILIC VEIN TRANSPOSITION (Right)  Patient Location: PACU  Anesthesia Type:General  Level of Consciousness: awake and alert   Airway and Oxygen Therapy: Patient Spontanous Breathing  Post-op Pain: none  Post-op Assessment: Post-op Vital signs reviewed, Patient's Cardiovascular Status Stable, Respiratory Function Stable, Patent Airway and No signs of Nausea or vomiting  Post-op Vital Signs: Reviewed and stable  Last Vitals:  Filed Vitals:   11/25/14 1123  BP: 125/54  Pulse: 93  Temp: 37 C  Resp: 19    Complications: No apparent anesthesia complications

## 2014-11-25 NOTE — Op Note (Signed)
Procedure: Right basilic vein transposition fistula  Preoperative diagnosis: End-stage renal disease  Postoperative diagnosis: Same  Anesthesia: Gen.  Assistant: Doreatha MassedSamantha Rhyne PA-C  Operative findings: 3-5 mm left basilic vein, 3 mm left brachial artery  Operative details: After obtaining informed consent, the patient was taken to the operating room. The patient was placed in supine position on the operating room table. After induction of general anesthesia, the patient's entire right upper extremity was prepped and draped in the usual sterile fashion.  Next ultrasound was used to identify the right basilic vein. A longitudinal incision was made just below the antecubital crease in order to expose the basilic vein. The vein was of good quality approximately 3-5 mm in diameter throughout its course. Several longitudinal skip incisions were made up the arm from just below the antecubital crease all the way up to the axilla to harvest the basilic vein. Care was taken to try to not injure any sensory nerves and all the motor nerves were identified and protected. The vein was dissected free circumferentially and small side branches ligated and divided between silk ties or clips. Next the brachial artery was exposed by deepening the basilic vein harvest incision just above the antecubital crease. The artery was approximately 3 mm in diameter. This was dissected free circumferentially. Vessel loops were placed around it. There was some spasm within the artery after dissecting it free. Next the distal basilic vein was ligated with a 2 silk tie and the vein transected. The vein was brought out throughout the skip incisions and gently distended with heparinized saline and marked for orientation. The vein was then tunneled subcutaneously in an arcing configuration out over the biceps muscle down to the level of the exposed brachial artery just above the antecubital crease. The patient was given 7000 units of  intravenous heparin. Vessel loops were used to control the artery proximally and distally. The vein was cut to length and sewn end of vein to side of artery using a running 7-0 Prolene suture. Just prior to completion of the anastomosis, it was forebled backbled and thoroughly flushed. The anastomosis was secured Vesseloops were released.  There was a palpable thrill immediately in the proximal aspect of the fistula. There was also good Doppler flow throughout the course of the fistula. The distal vein was also noted to fill fully. At this point all subcutaneous tissues were reapproximated using running 3-0 Vicryl suture. All skin incisions were closed with running 4  0 Vicryl subcuticular stitch. Dermabond was applied to all incisions. The patient tolerated the procedure well and there were no complications. Instrument sponge needle counts were correct at the end of the case. The patient was taken to the recovery room in stable condition.  The patient had and audible radial and ulnar doppler signal at the end of the case.  Fabienne Brunsharles Drexel Ivey, MD [facility] [place] Office: [phone] Pager: [phone]

## 2014-11-25 NOTE — Transfer of Care (Signed)
Immediate Anesthesia Transfer of Care Note  Patient: [name]  Procedure(s) Performed: Procedure(s): BASILIC VEIN TRANSPOSITION (Right)  Patient Location: PACU  Anesthesia Type:General  Level of Consciousness: awake and alert   Airway & Oxygen Therapy: Patient Spontanous Breathing and Patient connected to nasal cannula oxygen  Post-op Assessment: Report given to PACU RN, Post -op Vital signs reviewed and stable and Patient moving all extremities  Post vital signs: stable  Complications: No apparent anesthesia complications

## 2014-11-25 NOTE — H&P (Signed)
[facility] [place] HISTORY AND PHYSICAL    CC: Left graft failure repeated clotting  History of Present Illness:  Patient is a 73 y.o. year old male who presents for placement of a permanent hemodialysis access. The patient is right handed.  He has had left fore arm and left upper arm AV grafts in the past.  The left upper arm graft failed after multiple thrombectomy procedures 3 weeks ago.  He is on dialysis via left IJ catheter currently M-W-F.   Other chronic medical problems include.  DM treated with Insulin and history of hypertension currently not on antihypertensive medications.        Past Medical History   Diagnosis  Date   .  Hypertension     .  Atrial flutter     .  Obesity     .  COPD (chronic obstructive pulmonary disease)     .  Gout     .  CHF (congestive heart failure)     .  Anemia     .  ESRD (end stage renal disease) on dialysis         "[facility]; M- W F" (10/24/2013)    .  Type II diabetes mellitus     .  History of blood transfusion  2014       "only once, related to my dialysis" (12/24/2012)   .  Arthritis         "arms and hands" (10/24/2013)   .  Gout     .  Chronic back pain         "since had shot in it to put me asleep w/hip OR" (10/24/2013)   .  Anxiety     .  Depression     .  Acute duodenal ulcer with hemorrhage, without mention of obstruction     .  Hemorrhage of gastrointestinal tract, unspecified         Past Surgical History   Procedure  Laterality  Date   .  Arteriovenous graft placement    2008       left arm   .  Av fistula placement  Left  07/02/2013       Procedure: INSERTION OF ARTERIOVENOUS (AV) GORE-TEX GRAFT ARM;  Surgeon: Chuck Hinthristopher S Dickson, MD;  [facility];  Service: Vascular;  Laterality: Left;   .  Ligation arteriovenous gortex graft  Left  07/02/2013       Procedure: LIGATION ARTERIOVENOUS GORTEX GRAFT;  Surgeon: Chuck Hinthristopher S Dickson, MD;  [facility];  Service: Vascular;  Laterality: Left;   .   Insertion of dialysis catheter  N/A  07/02/2013       Procedure: INSERTION OF DIALYSIS CATHETER;  Surgeon: Chuck Hinthristopher S Dickson, MD;  [facility];  Service: Vascular;  Laterality: N/A;  Left Internal Jugular Placement   .  Orif femur fracture  Right  10/01/2013       Procedure: OPEN REDUCTION INTERNAL FIXATION (ORIF) DISTAL FEMUR FRACTURE;  Surgeon: Mable ParisJustin William Chandler, MD;  [facility];  Service: Orthopedics;  Laterality: Right;   .  Cholecystectomy       .  Total hip arthroplasty  Right     .  Esophagogastroduodenoscopy  N/A  10/25/2013       Procedure: ESOPHAGOGASTRODUODENOSCOPY (EGD);  Surgeon: Beverley FiedlerJay M Pyrtle, MD;  [facility];  Service: Endoscopy;  Laterality: N/A;   .  Esophagogastroduodenoscopy  N/A  10/27/2013  Procedure: ESOPHAGOGASTRODUODENOSCOPY (EGD);  Surgeon: Beverley Fiedler, MD;  [facility];  Service: Endoscopy;  Laterality: N/A;      Social History History   Substance Use Topics   .  Smoking status:  Former Smoker -- 0.50 packs/day for 31 years       Types:  Cigarettes       Quit date:  12/19/1984   .  Smokeless tobacco:  Never Used   .  Alcohol Use:  No     Family History Family History   Problem  Relation  Age of Onset   .  Heart attack  Mother     .  Heart attack  Father     .  Diabetes  Sister     .  Diabetes  Brother     .  Colon cancer  Neg Hx     .  Throat cancer  Neg Hx     .  Stomach cancer  Neg Hx     .  Inflammatory bowel disease  Neg Hx     .  Kidney disease  Brother     .  Liver disease  Neg Hx       Allergies    Allergies   Allergen  Reactions   .  Penicillins  Rash   .  Sulfa Antibiotics  Rash   .  Glucophage [Metformin Hydrochloride]  Rash        Current Outpatient Prescriptions   Medication  Sig  Dispense  Refill   .  allopurinol (ZYLOPRIM) 300 MG tablet  Take 150 mg by mouth daily.        Marland Kitchen  aspirin 81 MG tablet  Take 81 mg by mouth daily.       Marland Kitchen  HUMALOG 100 UNIT/ML injection  Inject into the  skin.     5   .  midodrine (PROAMATINE) 10 MG tablet  Take 10 mg by mouth. 1 tablet 30 minutes before dialysis       .  multivitamin (RENA-VIT) TABS tablet  Take 1 tablet by mouth daily.       .  fish oil-omega-3 fatty acids 1000 MG capsule  Take 1 g by mouth daily.       Marland Kitchen  HYDROcodone-acetaminophen (NORCO/VICODIN) 5-325 MG per tablet  Take one tablet by mouth every 8 hours as needed for pain  90 tablet  0   .  insulin aspart (NOVOLOG) 100 UNIT/ML injection  Inject 0-5 Units into the skin 4 (four) times daily -  before meals and at bedtime. 0 -150 = 0 units,  151+= 5 units       .  lactulose (CHRONULAC) 10 GM/15ML solution  Take 30 g by mouth 2 (two) times daily as needed for mild constipation.        Marland Kitchen  lanthanum (FOSRENOL) 1000 MG chewable tablet  Chew 1,000 mg by mouth 3 (three) times daily with meals.       .  pantoprazole (PROTONIX) 40 MG tablet  Take 1 tablet (40 mg total) by mouth 2 (two) times daily.  60 tablet  3   .  traZODone (DESYREL) 50 MG tablet  Take 50 mg by mouth at bedtime.       .  vitamin C (ASCORBIC ACID) 500 MG tablet  Take 500 mg by mouth daily.       Marland Kitchen  ZINC OXIDE, TOPICAL, 10 % CREA  Apply topically. To buttocks daily  No current facility-administered medications for this visit.     ROS:    General:  No weight loss, Fever, chills  HEENT: Pos recent headaches, no nasal bleeding, no visual changes, no sore throat  Neurologic: No dizziness, blackouts, seizures. No recent symptoms of stroke or mini- stroke. No recent episodes of slurred speech, or temporary blindness.  Cardiac: No recent episodes of chest pain/pressure, no shortness of breath at rest.  No shortness of breath with exertion.  Denies history of atrial fibrillation or irregular heartbeat  Vascular: No history of rest pain in feet.  No history of claudication.  No history of non-healing ulcer, No history of DVT    Pulmonary: No home oxygen, no productive cough, no hemoptysis,  No asthma or  wheezing  Musculoskeletal:  [ ]  Arthritis, [ ]  Low back pain,  [ ]  Joint pain  Hematologic:No history of hypercoagulable state.  No history of easy bleeding.  No history of anemia  Gastrointestinal: No hematochezia or melena,  No gastroesophageal reflux, no trouble swallowing  Urinary: [ ]  chronic Kidney disease, [ ]  on HD - [ ]  MWF or [ ]  TTHS, [ ]  Burning with urination, [ ]  Frequent urination, [ ]  Difficulty urinating;    Skin: No rashes  Psychological: No history of anxiety,  No history of depression   Physical Examination    Filed Vitals:     10/23/14 1350   BP:  129/71   Pulse:  94   Height:  5\' 11"  (1.803 m)   Weight:  216 lb (97.977 kg)   SpO2:  97%     Body mass index is 30.14 kg/(m^2).  General:  Alert and oriented, no acute distress HEENT: Normal Neck: No bruit or JVD Pulmonary: Clear to auscultation bilaterally Cardiac: Regular Rate and Rhythm without murmur Gastrointestinal: Soft, non-tender, non-distended, no mass, no scars Skin: No rash Extremity Pulses:  2+ radial, brachial pulses right Musculoskeletal: No deformity or edema     Neurologic: Upper and lower extremity motor 5/5 and symmetric.  He has bilateral intrathenar musculature waisting in both hands.  Cause unknown.  DATA:   Right upper extremity vein mapping 10/23/2014 Right 0.48-0.23 Basilic    ASSESSMENT:   ESRD He has had 2 grafts in the past the most recent failed 3 weeks ago and he is now in need of new access.   PLAN: Right AV fistula creation Basilic vein transposition. He was seen in conjunction with Dr. Darrick PennaFields today.  Thomasena EdisOLLINS, EMMA [facility] CenterMAUREEN PA-C [facility] [place] Office: [phone]  History and exam details as above. Patient has had multiple failures of left upper extremity AV grafts. He currently is dialyzing via left sided catheter. His dialysis today is Monday Wednesday Friday. He does have a reasonable vein in the right arm for possible basilic vein  transposition fistula. I did discuss with the patient at length today risk benefits possible complications and procedure details including the possibility that the fistula may not mature. Other small risks of bleeding infection ischemic steal.  He understands and agrees to proceed.  His fistula placement is scheduled for early December.  Fabienne Brunsharles Tatem Holsonback, MD [facility] [place] Office: [phone] Pager: [phone]

## 2014-11-25 NOTE — Discharge Instructions (Signed)

## 2014-11-26 ENCOUNTER — Encounter (HOSPITAL_COMMUNITY): Payer: Self-pay | Admitting: Vascular Surgery

## 2014-11-27 ENCOUNTER — Encounter (HOSPITAL_COMMUNITY): Payer: Self-pay | Admitting: Surgery

## 2015-01-21 ENCOUNTER — Encounter: Payer: Self-pay | Admitting: Vascular Surgery

## 2015-01-22 ENCOUNTER — Ambulatory Visit (HOSPITAL_COMMUNITY)
Admission: RE | Admit: 2015-01-22 | Discharge: 2015-01-22 | Disposition: A | Discharge status: Home / Self Care | Payer: Medicare Other | Source: Ambulatory Visit | Attending: Vascular Surgery | Admitting: Vascular Surgery

## 2015-01-22 ENCOUNTER — Encounter: Payer: Self-pay | Admitting: Vascular Surgery

## 2015-01-22 ENCOUNTER — Ambulatory Visit (INDEPENDENT_AMBULATORY_CARE_PROVIDER_SITE_OTHER): Payer: Medicare Other | Admitting: Vascular Surgery

## 2015-01-22 VITALS — BP 127/65 | HR 101 | Temp 97.8°F | Resp 16 | Ht 73.0 in | Wt 220.0 lb

## 2015-01-22 DIAGNOSIS — L97529 Non-pressure chronic ulcer of other part of left foot with unspecified severity: Secondary | ICD-10-CM | POA: Diagnosis not present

## 2015-01-22 DIAGNOSIS — Z4931 Encounter for adequacy testing for hemodialysis: Secondary | ICD-10-CM

## 2015-01-22 DIAGNOSIS — N186 End stage renal disease: Secondary | ICD-10-CM

## 2015-01-22 DIAGNOSIS — Z992 Dependence on renal dialysis: Secondary | ICD-10-CM

## 2015-01-22 DIAGNOSIS — L97519 Non-pressure chronic ulcer of other part of right foot with unspecified severity: Secondary | ICD-10-CM

## 2015-01-22 DIAGNOSIS — I739 Peripheral vascular disease, unspecified: Secondary | ICD-10-CM | POA: Insufficient documentation
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
## 2015-02-25 ENCOUNTER — Encounter: Payer: Self-pay | Admitting: Vascular Surgery

## 2015-02-26 ENCOUNTER — Other Ambulatory Visit: Payer: Self-pay | Admitting: Vascular Surgery

## 2015-02-26 ENCOUNTER — Ambulatory Visit (HOSPITAL_COMMUNITY)
Admission: RE | Admit: 2015-02-26 | Discharge: 2015-02-26 | Disposition: A | Discharge status: Home / Self Care | Payer: Medicare Other | Source: Ambulatory Visit | Attending: Vascular Surgery | Admitting: Vascular Surgery

## 2015-02-26 ENCOUNTER — Ambulatory Visit (INDEPENDENT_AMBULATORY_CARE_PROVIDER_SITE_OTHER): Payer: Medicare Other | Admitting: Vascular Surgery

## 2015-02-26 DIAGNOSIS — N186 End stage renal disease: Secondary | ICD-10-CM

## 2015-02-26 DIAGNOSIS — Z4931 Encounter for adequacy testing for hemodialysis: Secondary | ICD-10-CM

## 2015-02-26 DIAGNOSIS — Z992 Dependence on renal dialysis: Secondary | ICD-10-CM

## 2015-02-26 NOTE — Progress Notes (Signed)
Patient is a 74 year old male who returns for follow-up today after placement of a right basilic vein transposition fistula November 25 2014. He denies any symptoms of steal in the right hand. At his last office visit several weeks ago he also had ulcers on the bottoms of his feet and pretibial area from edema. He is have all essentially healed at this point.  Review of systems: Leg edema essentially resolved, he denies shortness of breath. He denies chest pain.  Physical exam:  Right upper extremity: Palpable fistula easily palpable thrill and audible bruit right hand pink warm  Cardiac: Regular rate and rhythm  Lower extremities less than 1 cm pretibial ulcer left pretibial area essentially healed  Data: Duplex ultrasound of AV fistula was performed today. I reviewed and interpreted this study. No significant stenosis throughout the fistula. His 5-3 mm diameter over 6 mm throughout the fistula course.  Assessment: Maturing right upper arm AV fistula, healed lower extremity wounds  Plan: Cannulate fistula starting next week follow-up as needed  Fabienne Brunsharles Altus Zaino, MD [facility] [place] Office: [phone] Pager: [phone]

## 2015-04-19 DEATH — deceased
